# Patient Record
Sex: Female | Born: 1970 | Hispanic: Yes | Smoking: Current every day smoker
Health system: Southern US, Community
[De-identification: ages and names within clinical notes are randomized; demographics above are authoritative.]

## PROBLEM LIST (undated history)

## (undated) DIAGNOSIS — F32A Depression, unspecified: Secondary | ICD-10-CM

## (undated) DIAGNOSIS — F209 Schizophrenia, unspecified: Secondary | ICD-10-CM

## (undated) DIAGNOSIS — L309 Dermatitis, unspecified: Secondary | ICD-10-CM

## (undated) DIAGNOSIS — T7840XA Allergy, unspecified, initial encounter: Secondary | ICD-10-CM

## (undated) DIAGNOSIS — I10 Essential (primary) hypertension: Secondary | ICD-10-CM

## (undated) DIAGNOSIS — E7800 Pure hypercholesterolemia, unspecified: Secondary | ICD-10-CM

## (undated) DIAGNOSIS — F329 Major depressive disorder, single episode, unspecified: Secondary | ICD-10-CM

## (undated) DIAGNOSIS — F319 Bipolar disorder, unspecified: Secondary | ICD-10-CM

## (undated) HISTORY — DX: Schizophrenia, unspecified: F20.9

## (undated) HISTORY — DX: Bipolar disorder, unspecified: F31.9

## (undated) HISTORY — DX: Pure hypercholesterolemia, unspecified: E78.00

## (undated) HISTORY — DX: Essential (primary) hypertension: I10

## (undated) HISTORY — DX: Depression, unspecified: F32.A

## (undated) HISTORY — DX: Dermatitis, unspecified: L30.9

## (undated) HISTORY — DX: Major depressive disorder, single episode, unspecified: F32.9

## (undated) HISTORY — DX: Allergy, unspecified, initial encounter: T78.40XA

## (undated) HISTORY — PX: NO PAST SURGERIES: SHX2092

---

## 2005-05-22 ENCOUNTER — Other Ambulatory Visit: Admission: RE | Admit: 2005-05-22 | Discharge: 2005-05-22 | Payer: Self-pay | Admitting: Gynecology

## 2005-08-31 ENCOUNTER — Encounter: Payer: Self-pay | Admitting: Internal Medicine

## 2005-09-01 ENCOUNTER — Ambulatory Visit: Payer: Self-pay | Admitting: Internal Medicine

## 2005-10-10 ENCOUNTER — Ambulatory Visit: Payer: Self-pay | Admitting: Internal Medicine

## 2005-10-17 ENCOUNTER — Ambulatory Visit: Payer: Self-pay | Admitting: Internal Medicine

## 2005-11-14 ENCOUNTER — Ambulatory Visit: Payer: Self-pay | Admitting: Internal Medicine

## 2005-11-25 ENCOUNTER — Emergency Department (HOSPITAL_COMMUNITY): Admission: EM | Admit: 2005-11-25 | Discharge: 2005-11-25 | Payer: Self-pay | Admitting: Emergency Medicine

## 2006-05-21 ENCOUNTER — Ambulatory Visit: Payer: Self-pay | Admitting: *Deleted

## 2006-05-21 ENCOUNTER — Inpatient Hospital Stay (HOSPITAL_COMMUNITY): Admission: AD | Admit: 2006-05-21 | Discharge: 2006-05-25 | Payer: Self-pay | Admitting: *Deleted

## 2008-01-16 ENCOUNTER — Ambulatory Visit: Payer: Self-pay | Admitting: Internal Medicine

## 2008-01-16 DIAGNOSIS — F250 Schizoaffective disorder, bipolar type: Secondary | ICD-10-CM

## 2008-01-18 DIAGNOSIS — E785 Hyperlipidemia, unspecified: Secondary | ICD-10-CM | POA: Insufficient documentation

## 2008-01-31 ENCOUNTER — Ambulatory Visit: Payer: Self-pay | Admitting: Internal Medicine

## 2008-01-31 LAB — CONVERTED CEMR LAB
Glucose, Urine, Semiquant: NEGATIVE
Ketones, urine, test strip: NEGATIVE
Nitrite: NEGATIVE
Specific Gravity, Urine: 1.02
WBC Urine, dipstick: NEGATIVE

## 2008-02-03 LAB — CONVERTED CEMR LAB
Albumin: 3.5 g/dL (ref 3.5–5.2)
BUN: 11 mg/dL (ref 6–23)
Basophils Relative: 0.6 % (ref 0.0–3.0)
Calcium: 9.3 mg/dL (ref 8.4–10.5)
Creatinine, Ser: 0.8 mg/dL (ref 0.4–1.2)
Direct LDL: 187.8 mg/dL
Eosinophils Absolute: 0.3 10*3/uL (ref 0.0–0.7)
Eosinophils Relative: 4.8 % (ref 0.0–5.0)
GFR calc Af Amer: 104 mL/min
GFR calc non Af Amer: 86 mL/min
HCT: 40 % (ref 36.0–46.0)
MCV: 91.3 fL (ref 78.0–100.0)
Monocytes Absolute: 0.4 10*3/uL (ref 0.1–1.0)
RBC: 4.38 M/uL (ref 3.87–5.11)
Triglycerides: 192 mg/dL — ABNORMAL HIGH (ref 0–149)
WBC: 6.2 10*3/uL (ref 4.5–10.5)

## 2008-09-22 ENCOUNTER — Ambulatory Visit: Payer: Self-pay | Admitting: Gynecology

## 2008-09-22 ENCOUNTER — Other Ambulatory Visit: Admission: RE | Admit: 2008-09-22 | Discharge: 2008-09-22 | Payer: Self-pay | Admitting: Gynecology

## 2008-09-22 ENCOUNTER — Encounter: Payer: Self-pay | Admitting: Gynecology

## 2008-10-05 ENCOUNTER — Encounter: Payer: Self-pay | Admitting: Internal Medicine

## 2008-10-05 ENCOUNTER — Ambulatory Visit: Payer: Self-pay | Admitting: Gynecology

## 2008-10-26 ENCOUNTER — Ambulatory Visit: Payer: Self-pay | Admitting: Internal Medicine

## 2008-10-26 DIAGNOSIS — J309 Allergic rhinitis, unspecified: Secondary | ICD-10-CM | POA: Insufficient documentation

## 2009-01-22 ENCOUNTER — Ambulatory Visit: Payer: Self-pay | Admitting: Internal Medicine

## 2009-01-22 LAB — CONVERTED CEMR LAB
ALT: 32 units/L (ref 0–35)
BUN: 12 mg/dL (ref 6–23)
Cholesterol: 209 mg/dL — ABNORMAL HIGH (ref 0–200)
Indirect Bilirubin: 0.3 mg/dL (ref 0.0–0.9)
Potassium: 4.5 meq/L (ref 3.5–5.3)
Sodium: 140 meq/L (ref 135–145)
Total Protein: 7.2 g/dL (ref 6.0–8.3)
Triglycerides: 179 mg/dL — ABNORMAL HIGH (ref <150)
VLDL: 36 mg/dL (ref 0–40)

## 2009-01-29 ENCOUNTER — Ambulatory Visit: Payer: Self-pay | Admitting: Internal Medicine

## 2009-02-24 ENCOUNTER — Ambulatory Visit: Payer: Self-pay | Admitting: Family Medicine

## 2009-02-25 ENCOUNTER — Encounter: Payer: Self-pay | Admitting: Family Medicine

## 2009-02-25 ENCOUNTER — Telehealth: Payer: Self-pay | Admitting: Family Medicine

## 2009-04-15 ENCOUNTER — Ambulatory Visit: Payer: Self-pay | Admitting: Internal Medicine

## 2009-04-15 LAB — CONVERTED CEMR LAB
Total CHOL/HDL Ratio: 3
Triglycerides: 101 mg/dL (ref 0.0–149.0)

## 2009-04-22 ENCOUNTER — Ambulatory Visit: Payer: Self-pay | Admitting: Internal Medicine

## 2009-05-06 ENCOUNTER — Telehealth: Payer: Self-pay | Admitting: *Deleted

## 2009-06-02 ENCOUNTER — Ambulatory Visit: Payer: Self-pay | Admitting: Internal Medicine

## 2010-02-08 NOTE — Letter (Signed)
Summary: Out of Work  Adult nurse at [employer]  [address]   [place], [place] [address]   Phone: [phone]  Fax: [phone]          February 24, 2009   Employee:  Helayne Mcneil    To Whom It May Concern:   For Medical reasons, please excuse the above named employee from work for the following dates:  Start:   Patient illness began 02/23/2009, OV today 02/24/2009  End:   Patient may RTW 02/25/2009  If you need additional information, please feel free to contact our office.         Sincerely,        Evelena Peat, MD

## 2010-02-08 NOTE — Progress Notes (Signed)
Summary: [facility] to [facility]  Phone Note From Pharmacy   Caller: [facility] Reason for Call: Needs renewal Details for Reason: simvastatin 40mg  Summary of Call: We have never sent a rx to [facility] for pt. I have called pt to make sure this is okay to do.  Initial call taken by: Romualdo Bolk, CMA Duncan Dull),  May 06, 2009 11:09 AM  Follow-up for Phone Call        [facility] Follow-up by: Romualdo Bolk, CMA Duncan Dull),  May 06, 2009 11:13 AM  Additional Follow-up for Phone Call Additional follow up Details #1::        Spoke to pt and she stated that this was okay to send to them. Rx sent electronically. Additional Follow-up by: Romualdo Bolk, CMA (AAMA),  May 10, 2009 4:26 PM    Prescriptions: SIMVASTATIN 40 MG TABS (SIMVASTATIN) 1 by mouth once daily  for high cholesterol  #90 x 2   Entered by:   Romualdo Bolk, CMA (AAMA)   Authorized by:   Madelin Headings MD   Signed by:   Romualdo Bolk, CMA (AAMA) on 05/10/2009   Method used:   Electronically to        [facility]* (mail-order)             ,          Ph: [phone]       Fax: [phone]   RxID:   [id number]

## 2010-02-08 NOTE — Assessment & Plan Note (Signed)
Summary: ? dehydration?/v/d/dm   Vital Signs:  Patient profile:   40 year old female Menstrual status:  regular Temp:     97.7 degrees F oral BP sitting:   102 / 80  (left arm) Cuff size:   regular  Vitals Entered By: Sid Falcon LPN (February 24, 2009 3:00 PM) CC: Diarrhea, vomiting X 2 days   History of Present Illness: Acute visit. Onset yesterday nausea, one episode of vomiting and diarrhea. 8-10 stools/day, generally watery stools. Nonbloody stools. Diffuse abdominal cramping. Generalized weakness and malaise. No muscle cramps. Denies any cough or respiratory symptoms. Keeping down some fluids today  Allergies: 1)  ! Jonne Ply  Past History:  Past Medical History: Last updated: 01/16/2008 GoPo due for gyne check  BIpolar   Consults [facility] Gretta Arab in the past.  Hyperlipidemia PMH reviewed for relevance  Review of Systems      See HPI  Physical Exam  General:  Well-developed,well-nourished,in no acute distress; alert,appropriate and cooperative throughout examination Head:  Normocephalic and atraumatic without obvious abnormalities. No apparent alopecia or balding. Ears:  External ear exam shows no significant lesions or deformities.  Otoscopic examination reveals clear canals, tympanic membranes are intact bilaterally without bulging, retraction, inflammation or discharge. Hearing is grossly normal bilaterally. Mouth:  Oral mucosa and oropharynx without lesions or exudates.  Teeth in good repair. Neck:  No deformities, masses, or tenderness noted. Lungs:  Normal respiratory effort, chest expands symmetrically. Lungs are clear to auscultation, no crackles or wheezes. Heart:  Normal rate and regular rhythm. S1 and S2 normal without gallop, murmur, click, rub or other extra sounds. Abdomen:  soft and non-tender.  somewhat hyperactive bowel sounds.  No mass.   Impression & Recommendations:  Problem # 1:  GASTROENTERITIS, VIRAL  (ICD-008.8) Imodium as needed diarrhea.  Electrolyte replacement.  Work note for next 2 days.  Complete Medication List: 1)  Zyprexa 10 Mg Tabs (Olanzapine) .Marland Kitchen.. 1 by mouth once daily per [facility] 2)  Flonase 50 Mcg/act Susp (Fluticasone propionate) .... 2 sprays each nares once daily for  nasal allergies 3)  Simvastatin 40 Mg Tabs (Simvastatin) .Marland Kitchen.. 1 by mouth once daily  for high cholesterol  Patient Instructions: 1)  Try over-the-counter Imodium for the next couple days as needed for diarrhea symptoms 2)  Eat more potassium rich foods such as bananas, oranje juice, and salt substitutes .  3)  start with bland diet and gradually progress as tolerated

## 2010-02-08 NOTE — Letter (Signed)
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   [place], [place] [address]   Phone: [phone]  Fax: [phone]    February 25, 2009        Employee:  Chelsea [name]    To Whom It May Concern:   For Medical reasons, please excuse the above named employee from work for the following dates:  Start:   02/24/2009  End:   02/25/2009  If you need additional information, please feel free to contact our office.         Sincerely,       Evelena Peat, MD

## 2010-02-08 NOTE — Assessment & Plan Note (Signed)
Summary: 8 wk rov/mm/pt rescd/ccm   Vital Signs:  Patient profile:   40 year old female Menstrual status:  regular LMP:     01/27/2009 Height:      66 inches Weight:      174 pounds BMI:     28.19 Pulse rate:   72 / minute BP sitting:   120 / 80  (right arm) Cuff size:   regular  Vitals Entered By: Romualdo Bolk, CMA (AAMA) (January 29, 2009 8:58 AM) CC: Follow-up visit on labs LMP (date): 01/27/2009 LMP - Character: normal Menarche (age onset years): 15   Menses interval (days): 28 Menstrual flow (days): 7 Menstrual Status regular Enter LMP: 01/27/2009   History of Present Illness: Chelsea [name] comes in today for    follow up of lipids.   Since last visit  here  there have been no major changes in health status   and is changing diet and nutrition but hasnt begun to exercise . ? inertia and has  sitting job. also she has been taking  a new supplement.  Natural  drink medication " transfer factor"     for cardio.   Sharp left  chest pain  gets this  in afternoon.    Lasts seconds off an  is not  assoicated with  sob   no HB  . ? if worse with caffiene.   sleep  ok .  No sleep association.    Preventive Screening-Counseling & Management  Alcohol-Tobacco     Alcohol drinks/day: 0     Smoking Status: quit     Year Quit: 2008  Caffeine-Diet-Exercise     Caffeine use/day: 4-5     Does Patient Exercise: no  Hep-HIV-STD-Contraception     Dental Visit-last 6 months no  Safety-Violence-Falls     Seat Belt Use: yes     Smoke Detectors: yes  Current Medications (verified): 1)  Zyprexa 10 Mg Tabs (Olanzapine) .Marland Kitchen.. 1 By Mouth Once Daily Per [facility] 2)  Flonase 50 Mcg/act Susp (Fluticasone Propionate) .... 2 Sprays Each Nares Once Daily For  Nasal Allergies 3)  Simvastatin 40 Mg Tabs (Simvastatin) .Marland Kitchen.. 1 By Mouth Once Daily  For High Cholesterol  Allergies (verified): 1)  ! Asa  Past History:  Past medical, surgical, family and social histories  (including risk factors) reviewed, and no changes noted (except as noted below).  Past Medical History: Reviewed history from 01/16/2008 and no changes required. GoPo due for gyne check  BIpolar   Consults [facility] Gretta Arab in the past.  Hyperlipidemia  Past Surgical History: Reviewed history from 01/16/2008 and no changes required. Denies surgical history  Past History:  Care Management: Gynecology: [name]  Family History: Reviewed history from 10/26/2008 and no changes required. Father: HBP, High Cholesterol Mother: HBP Siblings: na Brother on medication GP with tyoe 2 dm  No MI ofr cva/   Social History: Reviewed history from 01/16/2008 and no changes required. Former Smoker Alcohol use-no Drug use-no Regular exercise-no HH of 3 fa and mom    no pets  [place]ig from [place]  Occupation: call center    [employer]   Caffeine use/day:  4-5  Review of Systems  The patient denies anorexia, fever, weight loss, weight gain, vision loss, hoarseness, syncope, dyspnea on exertion, peripheral edema, prolonged cough, abdominal pain, melena, hematochezia, severe indigestion/heartburn, transient blindness, difficulty walking, unusual weight change, abnormal bleeding, enlarged lymph nodes, and angioedema.    Physical Exam  General:  Well-developed,well-nourished,in no acute distress; alert,appropriate and cooperative throughout examination Head:  normocephalic and atraumatic.   Eyes:  vision grossly intact and pupils equal.   Neck:  No deformities, masses, or tenderness noted. Chest Wall:  No deformities, masses, or tenderness noted. points to left mid calvicular line t 8-9 area  Lungs:  Normal respiratory effort, chest expands symmetrically. Lungs are clear to auscultation, no crackles or wheezes. Heart:  Normal rate and regular rhythm. S1 and S2 normal without gallop, murmur, click, rub or other extra sounds. Abdomen:  Bowel sounds  positive,abdomen soft and non-tender without masses, organomegaly or  noted.   somewaht protuberant Pulses:  pulses intact without delay   Extremities:  no clubbing cyanosis or edema  Neurologic:  non focal  Skin:  some midline hair lower abdomen Cervical Nodes:  No lymphadenopathy noted Psych:  Oriented X3, good eye contact, not anxious appearing, and not depressed appearing.   Additional Exam:  see labs     EKG NSR no acute changes   Impression & Recommendations:  Problem # 1:  HYPERLIPIDEMIA (ICD-272.4) Assessment Improved  ld from Dr Carmon Ginsberg was 192  tg 249 and risk ratio of 11 .  now 5.4   would like to get ldl to 100 or below if reasonable.  still needs to be better    disc use of supplements and unknown effect .    intensify lifestyle intervention and if not improved consider change ot more potent statin.   Her updated medication list for this problem includes:    Simvastatin 40 Mg Tabs (Simvastatin) .Marland Kitchen... 1 by mouth once daily  for high cholesterol  Labs Reviewed: SGOT: 23 (01/22/2009)   SGPT: 32 (01/22/2009)   HDL:39 (01/22/2009), 36.6 (01/31/2008)  LDL:134 (01/22/2009), DEL (01/31/2008)  Chol:209 (01/22/2009), 269 (01/31/2008)  Trig:179 (01/22/2009), 192 (01/31/2008)  Orders: EKG w/ Interpretation (93[address])  Problem # 2:  CHEST PAIN, INTERMITTENT (ICD-786.50) Assessment: New  fleeting and sounds like CWP  and no cardiac in natrue   . her lipids show risk however but improved   Orders: EKG w/ Interpretation ([address])  Complete Medication List: 1)  Zyprexa 10 Mg Tabs (Olanzapine) .Marland Kitchen.. 1 by mouth once daily per [facility] 2)  Flonase 50 Mcg/act Susp (Fluticasone propionate) .... 2 sprays each nares once daily for  nasal allergies 3)  Simvastatin 40 Mg Tabs (Simvastatin) .Marland Kitchen.. 1 by mouth once daily  for high cholesterol  Contraindications/Deferment of Procedures/Staging:    Test/Procedure: FLU VAX    Reason for deferment: patient declined   Patient Instructions: 1)   increase in exercise   as we discussed  walking  quickly is a good start.   Will decrease triglycerides  and   increase Good cholesterol HDL. 2)  It is important that you exercise reguarly at least 20 minutes 5 times a week. If you develop chest pain, have severe difficulty breathing, or feel very tired, stop exercising immediately and seek medical attention.  3)  I dont think your Chest pain is  cardiac and  your EKG is nl.  call if progressive and change in pain.  4)  Please schedule a follow-up appointment in 3 months .  5)  Lipid panel prior to visit ICD-9 :  272.4 6)  If not continuing to improve may change  medication to more potent statin medication.  Prescriptions: SIMVASTATIN 40 MG TABS (SIMVASTATIN) 1 by mouth once daily  for high cholesterol  #30 x 6   Entered and Authorized by:  Madelin Headings MD   Signed by:   Madelin Headings MD on 01/29/2009   Method used:   Electronically to        [id number]* (retail)       [address] [place], [place]  [address]       Ph: [phone]       Fax: [phone]   RxID:   [id number]

## 2010-02-08 NOTE — Progress Notes (Signed)
Summary: WORK NOTE FOR 01-25-2009  Phone Note Call from Patient Call back at Home Phone [phone]   Caller: Patient Call For: Madelin Headings MD Summary of Call: PT SAW DR Caryl Never 02-24-2009 PT NEEDS A WORK NOTE FOR 02-25-2009. PT WILL PICK UP NOTE Initial call taken by: Heron Sabins,  February 25, 2009 12:08 PM  Follow-up for Phone Call        I spoke with pt, she is still experiencing diarrhea.  Note written. Follow-up by: Sid Falcon LPN,  February 25, 2009 1:38 PM

## 2010-02-08 NOTE — Assessment & Plan Note (Signed)
Summary: severe lower back pain/cjr   Vital Signs:  Patient profile:   40 year old female Menstrual status:  regular LMP:     05/21/2009 Height:      66 inches Weight:      176 pounds Temp:     98.2 degrees F oral Pulse rate:   79 / minute BP sitting:   120 / 80  (right arm)  Vitals Entered By: Kathrynn Speed CMA (Jun 02, 2009 2:01 PM) CC: Lower Back radating down Rt leg x 10 days had moved last week lifted boxes, Back Pain LMP (date): 05/21/2009 LMP - Character: normal Menarche (age onset years): 15   Menses interval (days): 28 Menstrual flow (days): 7 Enter LMP: 05/21/2009   History of Present Illness: Chelsea [name] comes in today     for acute problem. onset last week    insidious onset    and then  after  household to [place] moving  and [employer] went down right leg  some better today.   No numbness or ringling or weakness   in leg.   No meds taken   yesterday.  Cant take nsaids  and asa.    See past notes of back pain with radiation in the past.    got better with pred.  PScyh   stable but needs to find a   new psych because of insurance no longer at [facility].  Back Pain History:      The patient's back pain started approximately 05/26/2009.  She states this is not work related.  On a scale of 1-10, she describes the pain as an 8.  She states that she has had a prior history of back pain.  The patient has not had any recent physical therapy for her back pain.  The following makes the back pain worse: bending lifting .    Critical Exclusionary Diagnosis Criteria (CEDC) for Back Pain:      The patient denies a history of previous trauma.  She has no prior history of spinal surgery.  There are no symptoms to suggest infection, cancer, or cauda equina.     Preventive Screening-Counseling & Management  Alcohol-Tobacco     Alcohol drinks/day: 0     Smoking Status: quit     Year Quit: 2008  Caffeine-Diet-Exercise     Caffeine use/day: 4-5     Does  Patient Exercise: no  Current Medications (verified): 1)  Zyprexa 10 Mg Tabs (Olanzapine) .Marland Kitchen.. 1 By Mouth Once Daily Per [facility] 2)  Flonase 50 Mcg/act Susp (Fluticasone Propionate) .... 2 Sprays Each Nares Once Daily For  Nasal Allergies 3)  Simvastatin 40 Mg Tabs (Simvastatin) .Marland Kitchen.. 1 By Mouth Once Daily  For High Cholesterol  Allergies (verified): 1)  ! Asa  Past History:  Past medical, surgical, family and social histories (including risk factors) reviewed, and no changes noted (except as noted below).  Past Medical History: Reviewed history from 01/16/2008 and no changes required. GoPo due for gyne check  BIpolar   Consults [facility] G[facility]ta [facility] in the past.  Hyperlipidemia  Past Surgical History: Reviewed history from 01/16/2008 and no changes required. Denies surgical history  Family History: Reviewed history from 10/26/2008 and no changes required. Father: HBP, High Cholesterol Mother: HBP Siblings: na Brother on medication GP with tyoe 2 dm  No MI ofr cva/   Social History: Reviewed history from 01/16/2008 and no changes required. Former Smoker Alcohol use-no Drug use-no  Regular exercise-no HH of 3 fa and mom    no pets  [place]ig from [place]  Occupation: call center    [employer]    Physical Exam  General:  Well-developed,well-nourished,in no acute distress; alert,appropriate and cooperative throughout examination Head:  normocephalic and atraumatic.   Neck:  No deformities, masses, or tenderness noted. Lungs:  normal respiratory effort, no intercostal retractions, and no accessory muscle use.   Heart:  normal rate and regular rhythm.   Pulses:  pulses intact without delay   Neurologic:  alert & oriented X3 and gait normal.   Skin:  turgor normal, color normal, no ecchymoses, and no petechiae.   Cervical Nodes:  No lymphadenopathy noted Psych:  Oriented X3, normally interactive, good eye contact, not anxious appearing,  and not depressed appearing.    Low Back Pain Physical Exam:    Inspection-deformity:     No    Palpation-spinal tenderness:   Yes    Motor Exam/Strength:         Left Ankle Dorsiflexion (L5,L4):     normal       Left Great Toe Dorsiflexion (L5,L4):     normal       Left Heel Walk (L5,some L4):     normal       Left Toe Walk-calf (S1):       normal       Right Ankle Dorsiflexion (L5,L4):     normal       Right Great Toe Dorsiflexion (L5,L4):       normal       Right Heel Walk (L5,some L4):     normal       Right Toe Walk-calf (S1):       normal    Reflexes:        Left Knee Jerk (L4):     normal       Left Ankle Reflex (S1):   normal       Right Knee Jerk:     normal       Right Ankle Reflex (S1):   normal    Straight Leg Raise (SLR):       Left Straight Leg Raise (SLR):   negative       Right Straight Leg Raise (SLR):   negative   Impression & Recommendations:  Problem # 1:  BACK PAIN, ACUTE (ICD-724.5) with some radiation  but better  mechanichal and onset with lifting and moving household  .  cant take nsaid so pred trial ok . Expectant management . and follow up .  Her updated medication list for this problem includes:    Cyclobenzaprine Hcl 10 Mg Tabs (Cyclobenzaprine hcl) .Marland Kitchen... 1 by mouth three times a day as needed muscle spasm  Problem # 2:  BIPOLAR DISORDER UNSPECIFIED (ICD-296.80) consider Dr Nolen Mu for new psych  Complete Medication List: 1)  Zyprexa 10 Mg Tabs (Olanzapine) .Marland Kitchen.. 1 by mouth once daily per [facility] 2)  Flonase 50 Mcg/act Susp (Fluticasone propionate) .... 2 sprays each nares once daily for  nasal allergies 3)  Simvastatin 40 Mg Tabs (Simvastatin) .Marland Kitchen.. 1 by mouth once daily  for high cholesterol 4)  Prednisone 20 Mg Tabs (Prednisone) .... Take 2 by mouth once daily for 3 days 1 by mouth once daily for 3days then 1/2 by mouth once daily for 3 days for back pain 5)  Cyclobenzaprine Hcl 10 Mg Tabs (Cyclobenzaprine hcl) .Marland Kitchen.. 1 by mouth three times  a day as needed  muscle spasm  Patient Instructions: 1)  avoid bending and lifting from back 2)  can do a rx with prednisone to reduce inflammation on pinched nerve and muscle relaxant  at  night if needed. 3)  Expect resolution within 1-2 weeks or so . 4)  call if not getting better as expected or as needed.  Prescriptions: CYCLOBENZAPRINE HCL 10 MG TABS (CYCLOBENZAPRINE HCL) 1 by mouth three times a day as needed muscle spasm  #30 x 0   Entered and Authorized by:   Madelin Headings MD   Signed by:   Madelin Headings MD on 06/02/2009   Method used:   Electronically to        [id number]* (retail)       [address] [place], [place]  [address]       Ph: [phone]       Fax: [phone]   RxID:   [id number] PREDNISONE 20 MG TABS (PREDNISONE) take 2 by mouth once daily for 3 days 1 by mouth once daily for 3days then 1/2 by mouth once daily for 3 days for back pain  #30 x 0   Entered and Authorized by:   Madelin Headings MD   Signed by:   Madelin Headings MD on 06/02/2009   Method used:   Electronically to        [id number]* (retail)       [address] [place], [place]  [address]       Ph: [phone]       Fax: [phone]   RxID:   [id number]

## 2010-02-08 NOTE — Assessment & Plan Note (Signed)
Summary: 3 MONTH FUP//CCM   Vital Signs:  Patient profile:   40 year old female Menstrual status:  regular LMP:     03/28/2009 Weight:      175 pounds Pulse rate:   66 / minute BP sitting:   130 / 80  (left arm) Cuff size:   regular  Vitals Entered By: Romualdo Bolk, CMA Duncan Dull) (April 22, 2009 8:47 AM) CC: follow-up visit on labs LMP (date): 03/28/2009 LMP - Character: normal Menarche (age onset years): 15   Menses interval (days): 28 Menstrual flow (days): 7 Enter LMP: 03/28/2009   History of Present Illness: Chelsea [name] comesin comes in today  for follow up of her lipids and medication. Since last visit  here  there have been no major changes in health status   she has taken her med without se  is trying to lose weight.   Would like help with this. no exercising much now.  zyprexa makesit hard to maintain helathy weight.  Preventive Screening-Counseling & Management  Alcohol-Tobacco     Alcohol drinks/day: 0     Smoking Status: quit     Year Quit: 2008  Caffeine-Diet-Exercise     Caffeine use/day: 4-5     Does Patient Exercise: no  Current Medications (verified): 1)  Zyprexa 10 Mg Tabs (Olanzapine) .Marland Kitchen.. 1 By Mouth Once Daily Per [facility] 2)  Flonase 50 Mcg/act Susp (Fluticasone Propionate) .... 2 Sprays Each Nares Once Daily For  Nasal Allergies 3)  Simvastatin 40 Mg Tabs (Simvastatin) .Marland Kitchen.. 1 By Mouth Once Daily  For High Cholesterol  Allergies (verified): 1)  ! Asa  Past History:  Past medical, surgical, family and social histories (including risk factors) reviewed for relevance to current acute and chronic problems.  Past Medical History: Reviewed history from 01/16/2008 and no changes required. GoPo due for gyne check  BIpolar   Consults [facility] Gretta Arab in the past.  Hyperlipidemia  Past Surgical History: Reviewed history from 01/16/2008 and no changes required. Denies surgical history  Past  History:  Care Management: Gynecology: [name] Psychiatry: [facility]  Family History: Reviewed history from 10/26/2008 and no changes required. Father: HBP, High Cholesterol Mother: HBP Siblings: na Brother on medication GP with tyoe 2 dm  No MI ofr cva/   Social History: Reviewed history from 01/16/2008 and no changes required. Former Smoker Alcohol use-no Drug use-no Regular exercise-no HH of 3 fa and mom    no pets  [place]ig from [place]  Occupation: call center    [employer]    Physical Exam  General:  Well-developed,well-nourished,in no acute distress; alert,appropriate and cooperative throughout examination Psych:  Oriented X3, good eye contact, not anxious appearing, and not depressed appearing.     Impression & Recommendations:  Problem # 1:  HYPERLIPIDEMIA (ICD-272.4) Assessment Improved  no se of med  is on high risk med but doing okk.      Treatment options discussed. .     counseled     will do a mutritionreferral and reasonable to do lifestyle intervention and then can try off med and see response.  Her updated medication list for this problem includes:    Simvastatin 40 Mg Tabs (Simvastatin) .Marland Kitchen... 1 by mouth once daily  for high cholesterol  Labs Reviewed: SGOT: 23 (01/22/2009)   SGPT: 32 (01/22/2009)   HDL:41.50 (04/15/2009), 39 (01/22/2009)  LDL:65 (04/15/2009), 134 (65/78/4696)  Chol:127 (04/15/2009), 209 (01/22/2009)  Trig:101.0 (04/15/2009), 179 (01/22/2009)  Orders: Nutrition  Referral (Nutrition)  Complete Medication List: 1)  Zyprexa 10 Mg Tabs (Olanzapine) .Marland Kitchen.. 1 by mouth once daily per [facility] 2)  Flonase 50 Mcg/act Susp (Fluticasone propionate) .... 2 sprays each nares once daily for  nasal allergies 3)  Simvastatin 40 Mg Tabs (Simvastatin) .Marland Kitchen.. 1 by mouth once daily  for high cholesterol  Patient Instructions: 1)  for now stay on medication  2)  will contact   you about nutrition referral 3)  weight loss exercise  diet  changes will help 4)  Call  If want to try off med and  recheck lipids  5)  Otherwise would recheck lipids and lfts in a year

## 2010-02-08 NOTE — Letter (Signed)
Summary: Out of Work  Adult nurse at [employer]  [address]   [place], [place] [address]   Phone: [phone]  Fax: [phone]    February 25, 2009          Employee:  Chelsea [name]    To Whom It May Concern:   For Medical reasons, please excuse the above named employee from work for the following dates:  Start:   02/25/2009  End:   02/25/2009   If you need additional information, please feel free to contact our office.         Sincerely,       Evelena Peat, MD

## 2010-05-24 NOTE — Discharge Summary (Signed)
Chelsea [name], Chelsea [name]              ACCOUNT NO.:  [id number]   MEDICAL RECORD NO.:  [id number]          PATIENT TYPE:  IPS   LOCATION:  0406                          [facility]ACILITY:  [facility]   PHYSICIAN:  Jasmine Pang, M.D. DATE OF BIRTH:  1970-01-31   DATE OF ADMISSION:  05/21/2006  DATE OF DISCHARGE:  05/25/2006                               DISCHARGE SUMMARY   IDENTIFYING INFORMATION:  This is a 40 year old single female who was  admitted on involuntary basis on May 21, 2006.   HISTORY OF PRESENT ILLNESS:  The patient has a history of depression.  She is here on involuntary petition.  The paper states that her  condition had deteriorated.  She had locked herself in the bathroom.  The family had to break the door down.  She has been not sleeping well  and not eating well.  The family states she talks to the television.  She has reportedly been noncompliant with her medications.  This is the  first [facility] admission for this patient.  She is sponsored by [facility].  She is from [place] and she has a  history of bipolar disorder.  The patient denies any alcohol or drug  use.  She has no acute or chronic medical problems.  She is supposed to  be on Lamictal, Seroquel, lithium and Xanax, but has not been compliant  with these medications.  She is allergic to aspirin.   PHYSICAL FINDINGS:  A complete physical exam was done by our nurse  practitioner.  The patient was found to be healthy with no acute  physical or medical distress.   ADMISSION LABORATORIES:  CBC was within normal limits.  Basic metabolic  panel was within normal limits.  Hepatic profile was grossly within  normal limits, except for a slightly elevated total bilirubin of 1.3.  Urine pregnancy test was negative.  TSH was 1.432, which was within  normal limits.  Urine drug screen was negative.  Urinalysis was  negative.   HOSPITAL COURSE:  Upon admission, the patient was continued on her  lithium  300 mg p.o. b.i.d..  An a.m. lithium level was obtained which  was low at 0.37.  She was also started on Seroquel 25 mg q.6 h p.r.n.  for anxiety.  On May 22, 2006, Xanax 0.5 mg p.o. q.8 h p.r.n. anxiety  was started and Seroquel 100 mg p.o. q.h.s. was started.  On May 23, 2006, lithium carbonate was increased to 300 mg in the morning and 600  mg at 1700.  An a.m. lithium level from today is still pending.  Seroquel was discontinued.  She was started on Risperdal 0.5 mg p.o.  b.i.d. and 1 mg p.o. q.h.s..  The patient tolerated these medications  well with no significant side effects.   The patient was initially very reserved and guarded.  She seemed  paranoid when I talked with her.  She was reportedly not taking her  medications and was referred by the [facility]  on involuntary papers on May 23, 2006.  The patient still  seems somewhat  paranoid, though she stated I am good.  She spoke with one-word  answers and did not endorse any problems.  She had poor eye contact with  psychomotor retardation.  On May 24, 2006, the patient was more  cooperative and friendly.  Sleep was good.  Appetite was good.  Mood was  stable.  Affect wide range.  No suicidal or homicidal ideation.  No  auditory or visual hallucinations.  No side effects to the meds.  She  discussed returning to live with her parents.  She states that they do  not speak [place] which is why they had not returned our calls to them.  It was anticipated that she would be discharged tomorrow.  On May 25, 2006, the patient's mental status exam remained unchanged from the day  before.  She was stable and felt ready for discharge.  She called her  parents for a ride home and will be staying with them.   DISCHARGE DIAGNOSES:  AXIS I:  Bipolar disorder NOS.  AXIS II:  None.  AXIS III:  No acute or chronic medical problems.  AXIS IV:  Moderate.  (Occupational problem, burden of psychiatric  illness)  AXIS V:   GAF upon discharge is 48.  GAF upon admission was 30.  GAF  highest past year 60-65.   DISCHARGE PLAN:  There were no specific activity level or dietary  restrictions.   POST-HOSPITAL CARE PLANS:  The patient will see Dr. Tomasa Rand, her  doctor at [facility], on June 4 at 2:30 p.m.Marland Kitchen  She will be seen at  [facility] for counseling and was given a number to call to arrange  this appointment.   DISCHARGE MEDICATIONS:  1. Lithium carbonate 300 mg in the morning and 600 mg at 5:00 p.m.      with food.  2. Risperdal 0.5 mg p.o. b.i.d. and 1 mg at bedtime.  3. Ambien 10 mg at bedtime if needed for insomnia.      Jasmine Pang, M.D.  Electronically Signed     BHS/MEDQ  D:  05/25/2006  T:  05/25/2006  Job:  [id number]

## 2010-05-24 NOTE — H&P (Signed)
NAMEMEGAHN, KILLINGS NO.:  [id number]   MEDICAL RECORD NO.:  [id number]          PATIENT TYPE:  IPS   LOCATION:  0401                          FACILITY:  [facility]   PHYSICIAN:  Jasmine Pang, M.D. DATE OF BIRTH:  Mar 22, 1970   DATE OF ADMISSION:  05/21/2006  DATE OF DISCHARGE:                       PSYCHIATRIC ADMISSION ASSESSMENT   IDENTIFYING INFORMATION:  The patient is a 40 year old single Hispanic  female involuntary admitted on May 21, 2006.   HISTORY OF PRESENT ILLNESS:  The patient is here on petition.  Papers  state that the patient's condition has been deteriorating.  Had locked  herself in the bathroom.  Family had to break down the door.  Poor  sleeping, poor eating, talking to the television and noncompliant with  her medications.  The patient states that she is not sure why she is  here.  She does report having problems with sleep, having problems with  panic attacks.  States she is up at night and sleeping during the day.  Denying any psychosis and does report that she has been compliant with  her medications.   PAST PSYCHIATRIC HISTORY:  First admission to the [facility].  She is currently sponsored by [facility].  Was hospitalized in [place] about two years ago.  Has a history of  bipolar disorder.   SOCIAL HISTORY:  This is a 40 year old single Hispanic female who lives  with her mother and father since the age of 32.  She is currently  unemployed.  She has not been working since November of 2007.  States  she had to leave her job due to panic attacks.  She has a college degree  with a degree in management.   FAMILY HISTORY:  None.   ALCOHOL/DRUG HISTORY:  The patient smokes.  Denies any alcohol or drug  use.   PRIMARY CARE PHYSICIAN:  Cannot remember her Kipp Shank's name but states  Layten Aiken is on [employer] in [place].   MEDICAL PROBLEMS:  Denies any acute or chronic health issues.   MEDICATIONS:  Has been on Lamictal 200 mg in a.m., Seroquel 200 mg in  the morning, 600 mg at bedtime, Xanax which we cannot confirm, lithium  300 mg, 1 in the morning and 2 at bedtime.  Again, reporting compliance  with her medications.   ALLERGIES:  ASPIRIN (states her eyes get swollen).   REVIEW OF SYSTEMS:  Denies any fever, chills, positive for insomnia and  no weight changes.  No chest pain or shortness of breath.  Does smoke.  Visual problems, wears glasses.  Denies any nausea, vomiting, diarrhea.  No dysuria.  No falls.  No seizures.  No weakness.  No tingling.  Positive for depression.   PHYSICAL EXAMINATION:  VITAL SIGNS:  Temperature is 97, heart rate 100,  respiratory rate 18, blood pressure is 155/103.  She is 5 feet 6 inches  tall, weighs 167 pounds.  GENERAL:  This is a young female in no acute distress.  NECK:  Negative lymphadenopathy.  The patient has a dry cough.  LUNGS:  Her bilateral breath sounds  are equal and clear.  No wheezing.  BREASTS:  Exam is deferred.  HEART:  Regular rate and rhythm.  No extra sounds auscultated.  ABDOMEN:  Soft, flat, nontender abdomen.  GU:  Exam is deferred.  EXTREMITIES:  The patient moves all extremities, 5+ against resistance.  No clubbing, no edema.  SKIN:  Warm and dry without rashes or lacerations.  Nursing assessment  notes the patient has a tattoo on her upper back and tattoo to her ankle  area.  NEUROLOGICAL:  Findings are intact and nonfocal.   LABORATORY DATA:  WBC count 11.6.  Alcohol level less than 5.  Bilirubin  is 1.3.  TSH is 1.432.   MENTAL STATUS EXAM:  She is fully alert.  She is cooperative.  She is  casually dressed.  She has good eye contact.  Speech is clear, somewhat  evasive in regards to answering questions.  No initiation of any  conversation.  Her mood is neutral.  The patient's affect is reserved.  Thought processes with some questionable paranoid ideation.  Her  cognitive function intact.  Her  memory is good.  Judgment is fair.  Insight is minimal.   DIAGNOSES:  AXIS I:  Bipolar disorder.  AXIS II:  Deferred.  AXIS III:  No acute or chronic health issues.  AXIS IV:  Problems with occupation, other psychosocial problems related  to burden of illness.  AXIS V:  Current 30-35.   PLAN:  Stabilize mood and thinking.  Contract for safety.  Will resume  her lithium and check lithium level.  Will have Seroquel available on a  p.r.n. basis for sleep.  We will contact family for background  information and discharge planning.  Medication compliance will be  reinforced.  The patient is to follow up with [facility].   TENTATIVE LENGTH OF STAY:  Four to five days.      Landry Corporal, N.P.      Jasmine Pang, M.D.  Electronically Signed    JO/MEDQ  D:  05/22/2006  T:  05/22/2006  Job:  [id number]

## 2010-05-27 NOTE — Assessment & Plan Note (Signed)
Chelsea [name]                              Chelsea [name]   NAME:Chelsea [name], Chelsea [name]                       MRN:          [id number]  DATE:09/01/2005                            DOB:          August 06, 1970    CHIEF COMPLAINT:  New patient to establish.  Problems with cholesterol.   HISTORY OF PRESENT ILLNESS:  Chelsea [name] is a 40 year old, ex-smoking,  divorced, [place] American woman, who comes in today for a first time  visit, referred by Dr. Reynaldo Minium, her GYN, for a problem with her  cholesterol.  She has generally been healthy.  The most problematic things  have been in the recent past, has been hospitalization for depression in  2005.  She has been diagnosed as bipolar.  Her current psychiatrist is Dr.  Tomasa Rand, and she has been on a current regimen of Lamictal 50 mg,  Risperdal 1 mg at night for at least a month, which is significantly helping  her mood, and she is doing well on that.  She has had a history of elevated  blood cholesterol readings in the past, but no medications.  She has a  remote history of migraines that are not significant otherwise.  She denies  any cardiovascular symptoms except for she gets an occasional sharp left  lower chest pain that is not related to exercise or eating.  It is not a  problem now, but she has not had any kind of recent EKG.   Lab tests done by Dr. [name] in May 2007 showed a normal urinalysis, CBC,  with the lipids of 231, triglycerides 67, HDL 59, LDL 159, and total ratio  of 3.9.  TSH and fasting blood sugar were within normal limits.   PAST MEDICAL HISTORY:  As above, and see data base.  She is primiparous, her  last period August 15, last Pap May 2007.  She thinks she is up to date on  her tetanus shot.   FAMILY HISTORY:  Both parents with elevated cholesterol, one with  hypertension, diabetes in PGM, MI in MGF.  Negative family history of  thyroid disease.   SOCIAL HISTORY:   Divorced, college education, works in Advertising account executive.  Lives with her cousin, no pets, 8 to 10 hours of sleep a night.  Exercises 3 days a week, __________  when she does that.  A remote history  of smoking, stopped over a year ago.   REVIEW OF SYSTEMS:  Chest pain as above, but really no exercise-induced  symptoms or shortness of breath or cough. The rest of the ROS negative or  noncontributory.   MEDICATIONS:  1. Risperdal 1 mg.  2. Lamictal 15 mg.  3. OTC vitamins.   ALLERGIES:  ASA.   OBJECTIVE:  VITAL SIGNS:  Height reported as 5 feet 6 inches about, weight  140 pounds, pulse 80 and regular, blood pressure 120/80.  GENERAL:  This is a WDWN, healthy-appearing, middle-aged lady in no acute  distress.  HEENT:  Grossly normal.  NECK:  Supple without masses, although thyroid is easily palpable there  are  no nodules noted, no bruits are noted.  CHEST:  CTA.  BS equal.  CARDIAC:  S1, S2, no gallops or murmurs.  EXTREMITIES:  Peripheral pulses present without delay, negative CCE.  ABDOMEN:  Soft without organomegaly, guarding or rebound.  No obvious chest  wall pain noted.  NEUROLOGIC:  Grossly intact, and mood appears within normal limits and  appropriate affect.  She is also nonicteric.   LABS:  See records to review.   IMPRESSION:  Elevated cholesterol, positive family history of hypertension  and diabetes and hyperlipidemia.  Fortunately, she does not have elevated  triglycerides, and low HDL, with her LDL 159 and a reasonable ration at  present, and for this reason we discussed this.  We would like to do further  efforts on lifestyle changes over the next 3 or 4 months to see if we can  improve her lipid profile.  She is also on Risperdal, which somewhat a risk  for hyperglycemia, but we see no evidence of this at present.  We will have  her check fasting lab work about October 2007, with lipids, blood sugar and  liver function tests, because she has had a  history of abnormals in the past  when she was on Depakote, and then a followup office visit then.  Planned  followup after that time.   ADDITIONAL HISTORY:  We did do an EKG today because of a history of  questionable atypical chest pain, and this was in normal limits, with some  decreased R forces in V3, probably normal.                                   Burna Mortimer K. Fabian Sharp, MD   WKP/MedQ  DD:  09/01/2005  DT:  09/02/2005  Job #:  [id number]

## 2011-08-01 ENCOUNTER — Encounter: Payer: Self-pay | Admitting: Internal Medicine

## 2011-11-17 ENCOUNTER — Ambulatory Visit: Payer: Self-pay | Admitting: Internal Medicine

## 2011-11-20 ENCOUNTER — Encounter: Payer: Self-pay | Admitting: Internal Medicine

## 2011-11-20 ENCOUNTER — Ambulatory Visit: Payer: Self-pay | Admitting: Internal Medicine

## 2011-11-20 DIAGNOSIS — Z0289 Encounter for other administrative examinations: Secondary | ICD-10-CM

## 2013-10-04 DIAGNOSIS — E875 Hyperkalemia: Secondary | ICD-10-CM | POA: Insufficient documentation

## 2013-10-04 DIAGNOSIS — D72829 Elevated white blood cell count, unspecified: Secondary | ICD-10-CM | POA: Insufficient documentation

## 2013-10-04 DIAGNOSIS — E7220 Disorder of urea cycle metabolism, unspecified: Secondary | ICD-10-CM | POA: Insufficient documentation

## 2015-09-18 LAB — HM MAMMOGRAPHY

## 2015-12-14 ENCOUNTER — Ambulatory Visit: Payer: Self-pay | Admitting: Gynecology

## 2016-01-12 ENCOUNTER — Ambulatory Visit (INDEPENDENT_AMBULATORY_CARE_PROVIDER_SITE_OTHER): Payer: Self-pay | Admitting: Gynecology

## 2016-01-12 ENCOUNTER — Encounter: Payer: Self-pay | Admitting: Gynecology

## 2016-01-12 VITALS — BP 138/88 | Ht 65.25 in | Wt 174.0 lb

## 2016-01-12 DIAGNOSIS — F17200 Nicotine dependence, unspecified, uncomplicated: Secondary | ICD-10-CM

## 2016-01-12 DIAGNOSIS — Z01419 Encounter for gynecological examination (general) (routine) without abnormal findings: Secondary | ICD-10-CM

## 2016-01-12 NOTE — Progress Notes (Signed)
Chelsea [name] 05-Jul-1970 [id number]   History:    46 y.o.  for annual gyn exam who has not been seen the office in over 5 years. She stated her PCP has done her Pap smear 2 years ago. She also states that when she was in her early 64s while living important [place] she had an abnormal Pap smear had cryotherapy and subsequent Pap smears have been normal. She states she has not been sexually active in over 11 years she is a chronic smoker smokes between one to one half pack of cigarettes per day. She is also being followed by her psychiatrist for bipolar disorder. Patient reports normal menstrual cycles.  Past medical history,surgical history, family history and social history were all reviewed and documented in the EPIC chart.  Gynecologic History Patient's last menstrual period was 12/24/2015. Contraception: none Last Pap: 2 years ago. Results were: normal Last mammogram: 2017. Results were: normal  Obstetric History OB History  Gravida Para Term Preterm AB Living  0 0 0 0 0 0  SAB TAB Ectopic Multiple Live Births  0 0 0 0 0         ROS: A ROS was performed and pertinent positives and negatives are included in the history.  GENERAL: No fevers or chills. HEENT: No change in vision, no earache, sore throat or sinus congestion. NECK: No pain or stiffness. CARDIOVASCULAR: No chest pain or pressure. No palpitations. PULMONARY: No shortness of breath, cough or wheeze. GASTROINTESTINAL: No abdominal pain, nausea, vomiting or diarrhea, melena or bright red blood per rectum. GENITOURINARY: No urinary frequency, urgency, hesitancy or dysuria. MUSCULOSKELETAL: No joint or muscle pain, no back pain, no recent trauma. DERMATOLOGIC: No rash, no itching, no lesions. ENDOCRINE: No polyuria, polydipsia, no heat or cold intolerance. No recent change in weight. HEMATOLOGICAL: No anemia or easy bruising or bleeding. NEUROLOGIC: No headache, seizures, numbness, tingling or weakness. PSYCHIATRIC: No  depression, no loss of interest in normal activity or change in sleep pattern.     Exam: chaperone present  BP 138/88   Ht 5' 5.25" (1.657 m)   Wt 174 lb (78.9 kg)   LMP 12/24/2015   BMI 28.73 kg/m   Body mass index is 28.73 kg/m.  General appearance : Well developed well nourished female. No acute distress HEENT: Eyes: no retinal hemorrhage or exudates,  Neck supple, trachea midline, no carotid bruits, no thyroidmegaly Lungs: Clear to auscultation, no rhonchi or wheezes, or rib retractions  Heart: Regular rate and rhythm, no murmurs or gallops Breast:Examined in sitting and supine position were symmetrical in appearance, no palpable masses or tenderness,  no skin retraction, no nipple inversion, no nipple discharge, no skin discoloration, no axillary or supraclavicular lymphadenopathy Abdomen: no palpable masses or tenderness, no rebound or guarding Extremities: no edema or skin discoloration or tenderness  Pelvic:  Bartholin, Urethra, Skene Glands: Within normal limits             Vagina: No gross lesions or discharge  Cervix: No gross lesions or discharge  Uterus  anteverted, normal size, shape and consistency, non-tender and mobile  Adnexa  Without masses or tenderness  Anus and perineum  normal   Rectovaginal  normal sphincter tone without palpated masses or tenderness             Hemoccult not indicated     Assessment/Plan:  46 y.o. female for annual exam was counseled on the detrimental effects of smoking and literature information was provided. Pap smear was done  today. PCP has been doing her blood work. Patient declined flu vaccine today. She was encouraged to do her monthly breast exams.   Ok EdwardsFERNANDEZ,JUAN H MD, 10:58 AM 01/12/2016

## 2016-01-12 NOTE — Patient Instructions (Signed)
Steps to Quit Smoking Smoking tobacco can be harmful to your health and can affect almost every organ in your body. Smoking puts you, and those around you, at risk for developing many serious chronic diseases. Quitting smoking is difficult, but it is one of the best things that you can do for your health. It is never too late to quit. What are the benefits of quitting smoking? When you quit smoking, you lower your risk of developing serious diseases and conditions, such as:  Lung cancer or lung disease, such as COPD.  Heart disease.  Stroke.  Heart attack.  Infertility.  Osteoporosis and bone fractures.  Additionally, symptoms such as coughing, wheezing, and shortness of breath may get better when you quit. You may also find that you get sick less often because your body is stronger at fighting off colds and infections. If you are pregnant, quitting smoking can help to reduce your chances of having a baby of low birth weight. How do I get ready to quit? When you decide to quit smoking, create a plan to make sure that you are successful. Before you quit:  Pick a date to quit. Set a date within the next two weeks to give you time to prepare.  Write down the reasons why you are quitting. Keep this list in places where you will see it often, such as on your bathroom mirror or in your car or wallet.  Identify the people, places, things, and activities that make you want to smoke (triggers) and avoid them. Make sure to take these actions: ? Throw away all cigarettes at home, at work, and in your car. ? Throw away smoking accessories, such as ashtrays and lighters. ? Clean your car and make sure to empty the ashtray. ? Clean your home, including curtains and carpets.  Tell your family, friends, and coworkers that you are quitting. Support from your loved ones can make quitting easier.  Talk with your health care provider about your options for quitting smoking.  Find out what treatment  options are covered by your health insurance.  What strategies can I use to quit smoking? Talk with your healthcare provider about different strategies to quit smoking. Some strategies include:  Quitting smoking altogether instead of gradually lessening how much you smoke over a period of time. Research shows that quitting "cold turkey" is more successful than gradually quitting.  Attending in-person counseling to help you build problem-solving skills. You are more likely to have success in quitting if you attend several counseling sessions. Even short sessions of 10 minutes can be effective.  Finding resources and support systems that can help you to quit smoking and remain smoke-free after you quit. These resources are most helpful when you use them often. They can include: ? Online chats with a counselor. ? Telephone quitlines. ? Printed self-help materials. ? Support groups or group counseling. ? Text messaging programs. ? Mobile phone applications.  Taking medicines to help you quit smoking. (If you are pregnant or breastfeeding, talk with your health care provider first.) Some medicines contain nicotine and some do not. Both types of medicines help with cravings, but the medicines that include nicotine help to relieve withdrawal symptoms. Your health care provider may recommend: ? Nicotine patches, gum, or lozenges. ? Nicotine inhalers or sprays. ? Non-nicotine medicine that is taken by mouth.  Talk with your health care provider about combining strategies, such as taking medicines while you are also receiving in-person counseling. Using these two strategies together   makes you more likely to succeed in quitting than if you used either strategy on its own. If you are pregnant or breastfeeding, talk with your health care provider about finding counseling or other support strategies to quit smoking. Do not take medicine to help you quit smoking unless told to do so by your health care  provider. What things can I do to make it easier to quit? Quitting smoking might feel overwhelming at first, but there is a lot that you can do to make it easier. Take these important actions:  Reach out to your family and friends and ask that they support and encourage you during this time. Call telephone quitlines, reach out to support groups, or work with a counselor for support.  Ask people who smoke to avoid smoking around you.  Avoid places that trigger you to smoke, such as bars, parties, or smoke-break areas at work.  Spend time around people who do not smoke.  Lessen stress in your life, because stress can be a smoking trigger for some people. To lessen stress, try: ? Exercising regularly. ? Deep-breathing exercises. ? Yoga. ? Meditating. ? Performing a body scan. This involves closing your eyes, scanning your body from head to toe, and noticing which parts of your body are particularly tense. Purposefully relax the muscles in those areas.  Download or purchase mobile phone or tablet apps (applications) that can help you stick to your quit plan by providing reminders, tips, and encouragement. There are many free apps, such as QuitGuide from the CDC (Centers for Disease Control and Prevention). You can find other support for quitting smoking (smoking cessation) through smokefree.gov and other websites.  How will I feel when I quit smoking? Within the first 24 hours of quitting smoking, you may start to feel some withdrawal symptoms. These symptoms are usually most noticeable 2-3 days after quitting, but they usually do not last beyond 2-3 weeks. Changes or symptoms that you might experience include:  Mood swings.  Restlessness, anxiety, or irritation.  Difficulty concentrating.  Dizziness.  Strong cravings for sugary foods in addition to nicotine.  Mild weight gain.  Constipation.  Nausea.  Coughing or a sore throat.  Changes in how your medicines work in your  body.  A depressed mood.  Difficulty sleeping (insomnia).  After the first 2-3 weeks of quitting, you may start to notice more positive results, such as:  Improved sense of smell and taste.  Decreased coughing and sore throat.  Slower heart rate.  Lower blood pressure.  Clearer skin.  The ability to breathe more easily.  Fewer sick days.  Quitting smoking is very challenging for most people. Do not get discouraged if you are not successful the first time. Some people need to make many attempts to quit before they achieve long-term success. Do your best to stick to your quit plan, and talk with your health care provider if you have any questions or concerns. This information is not intended to replace advice given to you by your health care provider. Make sure you discuss any questions you have with your health care provider. Document Released: 12/20/2000 Document Revised: 08/24/2015 Document Reviewed: 05/12/2014 Elsevier Interactive Patient Education  2017 Elsevier Inc.  

## 2016-01-13 LAB — PAP IG W/ RFLX HPV ASCU

## 2016-05-24 ENCOUNTER — Encounter: Payer: Self-pay | Admitting: Gynecology

## 2016-06-02 DIAGNOSIS — Z79899 Other long term (current) drug therapy: Secondary | ICD-10-CM | POA: Diagnosis not present

## 2016-06-02 DIAGNOSIS — R21 Rash and other nonspecific skin eruption: Secondary | ICD-10-CM | POA: Diagnosis not present

## 2016-06-02 DIAGNOSIS — F17210 Nicotine dependence, cigarettes, uncomplicated: Secondary | ICD-10-CM | POA: Diagnosis not present

## 2016-06-02 DIAGNOSIS — F209 Schizophrenia, unspecified: Secondary | ICD-10-CM | POA: Diagnosis not present

## 2016-06-02 DIAGNOSIS — I10 Essential (primary) hypertension: Secondary | ICD-10-CM | POA: Diagnosis not present

## 2016-06-02 DIAGNOSIS — Z87892 Personal history of anaphylaxis: Secondary | ICD-10-CM | POA: Diagnosis not present

## 2016-06-02 DIAGNOSIS — Z720 Tobacco use: Secondary | ICD-10-CM | POA: Diagnosis not present

## 2016-06-02 DIAGNOSIS — Z888 Allergy status to other drugs, medicaments and biological substances status: Secondary | ICD-10-CM | POA: Diagnosis not present

## 2016-06-02 DIAGNOSIS — Z886 Allergy status to analgesic agent status: Secondary | ICD-10-CM | POA: Diagnosis not present

## 2016-06-02 DIAGNOSIS — F319 Bipolar disorder, unspecified: Secondary | ICD-10-CM | POA: Diagnosis not present

## 2016-07-20 DIAGNOSIS — F319 Bipolar disorder, unspecified: Secondary | ICD-10-CM | POA: Diagnosis not present

## 2016-07-20 DIAGNOSIS — F17210 Nicotine dependence, cigarettes, uncomplicated: Secondary | ICD-10-CM | POA: Diagnosis not present

## 2016-07-20 DIAGNOSIS — F209 Schizophrenia, unspecified: Secondary | ICD-10-CM | POA: Diagnosis not present

## 2016-07-20 DIAGNOSIS — I10 Essential (primary) hypertension: Secondary | ICD-10-CM | POA: Diagnosis not present

## 2016-07-20 DIAGNOSIS — Z720 Tobacco use: Secondary | ICD-10-CM | POA: Diagnosis not present

## 2016-07-20 DIAGNOSIS — Z79899 Other long term (current) drug therapy: Secondary | ICD-10-CM | POA: Diagnosis not present

## 2016-07-20 DIAGNOSIS — Z888 Allergy status to other drugs, medicaments and biological substances status: Secondary | ICD-10-CM | POA: Diagnosis not present

## 2016-07-20 DIAGNOSIS — Z886 Allergy status to analgesic agent status: Secondary | ICD-10-CM | POA: Diagnosis not present

## 2016-09-24 DIAGNOSIS — L309 Dermatitis, unspecified: Secondary | ICD-10-CM | POA: Diagnosis not present

## 2016-09-24 DIAGNOSIS — I10 Essential (primary) hypertension: Secondary | ICD-10-CM | POA: Diagnosis not present

## 2016-09-24 DIAGNOSIS — L03119 Cellulitis of unspecified part of limb: Secondary | ICD-10-CM | POA: Diagnosis not present

## 2016-09-24 DIAGNOSIS — L03114 Cellulitis of left upper limb: Secondary | ICD-10-CM | POA: Diagnosis not present

## 2016-09-24 DIAGNOSIS — L03113 Cellulitis of right upper limb: Secondary | ICD-10-CM | POA: Diagnosis not present

## 2016-10-12 DIAGNOSIS — J309 Allergic rhinitis, unspecified: Secondary | ICD-10-CM | POA: Diagnosis not present

## 2016-10-12 DIAGNOSIS — F17210 Nicotine dependence, cigarettes, uncomplicated: Secondary | ICD-10-CM | POA: Diagnosis present

## 2016-10-12 DIAGNOSIS — F250 Schizoaffective disorder, bipolar type: Secondary | ICD-10-CM | POA: Diagnosis not present

## 2016-10-12 DIAGNOSIS — F419 Anxiety disorder, unspecified: Secondary | ICD-10-CM | POA: Diagnosis present

## 2016-10-12 DIAGNOSIS — F17200 Nicotine dependence, unspecified, uncomplicated: Secondary | ICD-10-CM | POA: Diagnosis not present

## 2016-10-12 DIAGNOSIS — E7800 Pure hypercholesterolemia, unspecified: Secondary | ICD-10-CM | POA: Diagnosis not present

## 2016-10-12 DIAGNOSIS — Z888 Allergy status to other drugs, medicaments and biological substances status: Secondary | ICD-10-CM | POA: Diagnosis not present

## 2016-10-12 DIAGNOSIS — I10 Essential (primary) hypertension: Secondary | ICD-10-CM | POA: Diagnosis not present

## 2016-10-12 DIAGNOSIS — L309 Dermatitis, unspecified: Secondary | ICD-10-CM | POA: Diagnosis not present

## 2016-10-12 DIAGNOSIS — G4700 Insomnia, unspecified: Secondary | ICD-10-CM | POA: Diagnosis present

## 2016-10-12 DIAGNOSIS — Z886 Allergy status to analgesic agent status: Secondary | ICD-10-CM | POA: Diagnosis not present

## 2016-10-12 DIAGNOSIS — Z79899 Other long term (current) drug therapy: Secondary | ICD-10-CM | POA: Diagnosis not present

## 2016-10-16 DIAGNOSIS — I10 Essential (primary) hypertension: Secondary | ICD-10-CM | POA: Insufficient documentation

## 2016-10-16 DIAGNOSIS — L309 Dermatitis, unspecified: Secondary | ICD-10-CM | POA: Insufficient documentation

## 2016-10-27 ENCOUNTER — Telehealth: Payer: Self-pay | Admitting: Internal Medicine

## 2016-10-27 NOTE — Telephone Encounter (Signed)
Caller name:  Relation to ZO:XWRUpt:self Call back [phone] Pharmacy:  Reason for call: pt would like to know if you will except her as a new pt, [place] States Dr. Drue NovelPaz sees her mom and her father and she would like to stay with them since she brings them to all of their appts and she likes Dr. Drue NovelPaz.

## 2016-10-27 NOTE — Telephone Encounter (Signed)
That is okay, set an appointment at the patient's convenience. We need to get records from her previous PCP

## 2016-10-27 NOTE — Telephone Encounter (Signed)
Please advise 

## 2016-10-27 NOTE — Telephone Encounter (Signed)
Contacted the patient informed her to call the office to make appt

## 2016-10-31 DIAGNOSIS — F411 Generalized anxiety disorder: Secondary | ICD-10-CM | POA: Diagnosis not present

## 2016-10-31 DIAGNOSIS — F250 Schizoaffective disorder, bipolar type: Secondary | ICD-10-CM | POA: Diagnosis not present

## 2016-10-31 DIAGNOSIS — G0481 Other encephalitis and encephalomyelitis: Secondary | ICD-10-CM | POA: Diagnosis not present

## 2016-11-01 DIAGNOSIS — L239 Allergic contact dermatitis, unspecified cause: Secondary | ICD-10-CM | POA: Diagnosis not present

## 2016-11-14 DIAGNOSIS — L280 Lichen simplex chronicus: Secondary | ICD-10-CM | POA: Diagnosis not present

## 2016-11-14 DIAGNOSIS — B86 Scabies: Secondary | ICD-10-CM | POA: Diagnosis not present

## 2016-11-14 DIAGNOSIS — R21 Rash and other nonspecific skin eruption: Secondary | ICD-10-CM | POA: Diagnosis not present

## 2016-11-14 DIAGNOSIS — L981 Factitial dermatitis: Secondary | ICD-10-CM | POA: Diagnosis not present

## 2016-11-28 ENCOUNTER — Encounter: Payer: Self-pay | Admitting: Internal Medicine

## 2016-11-28 ENCOUNTER — Ambulatory Visit (INDEPENDENT_AMBULATORY_CARE_PROVIDER_SITE_OTHER): Payer: Medicare Other | Admitting: Internal Medicine

## 2016-11-28 VITALS — BP 134/78 | HR 90 | Temp 97.9°F | Resp 14 | Ht 65.0 in | Wt 172.5 lb

## 2016-11-28 DIAGNOSIS — E785 Hyperlipidemia, unspecified: Secondary | ICD-10-CM

## 2016-11-28 DIAGNOSIS — I10 Essential (primary) hypertension: Secondary | ICD-10-CM

## 2016-11-28 DIAGNOSIS — Z09 Encounter for follow-up examination after completed treatment for conditions other than malignant neoplasm: Secondary | ICD-10-CM | POA: Insufficient documentation

## 2016-11-28 DIAGNOSIS — D649 Anemia, unspecified: Secondary | ICD-10-CM

## 2016-11-28 DIAGNOSIS — R739 Hyperglycemia, unspecified: Secondary | ICD-10-CM

## 2016-11-28 NOTE — Patient Instructions (Signed)
GO TO THE FRONT DESK Schedule labs to be done 2 weeks from today: FLP, CMP, A1c  Schedule your next appointment for a checkup with me in 6 months  If you need refills on amlodipine or atorvastatin please call  your pharmacy

## 2016-11-28 NOTE — Progress Notes (Signed)
Subjective:    Patient ID: Chelsea BostonJulissa [name], female    DOB: Jan 09, 1971, 46 y.o.   MRN: [id number]  DOS:  11/28/2016 Type of visit - description : New patient, to get established, used to see Dr Jordan HawksLLibre in [place] Interval history: The patient was admitted to the hospital due to bipolar disorder, discharged home 10/21/2016. At the time her cholesterol was noted to be elevated and was prescribed cholesterol medication. Also BP was elevated and was prescribed BP medications for the first time. Good compliance without apparent side effects.   Review of Systems Denies unusual aches and pains. No nausea, vomiting or abdominal pain.  From time to time sees a small amount of red blood after a bowel movement, blood is in the toilet paper. Periods are monthly, heavy, they last 10-12 days.  Not on birth control pills.  Past Medical History:  Diagnosis Date  . Allergy   . Bipolar disorder (HCC)   . Depression   . Eczema   . Elevated cholesterol   . Hypertension   . Schizophrenia [facility](HCC)     Past Surgical History:  Procedure Laterality Date  . NO PAST SURGERIES      Social History   Socioeconomic History  . Marital status: Divorced    Spouse name: Not on file  . Number of children: Not on file  . Years of education: Not on file  . Highest education level: Not on file  Social Needs  . Financial resource strain: Not on file  . Food insecurity - worry: Not on file  . Food insecurity - inability: Not on file  . Transportation needs - medical: Not on file  . Transportation needs - non-medical: Not on file  Occupational History  . Occupation: disability   Tobacco Use  . Smoking status: Current Every Day Smoker    Packs/day: 1.50    Types: Cigarettes  . Smokeless tobacco: Never Used  Substance and Sexual Activity  . Alcohol use: Yes    Frequency: Never    Comment:  socially , rare   . Drug use: No  . Sexual activity: No  Other Topics Concern  . Not on file  Social History  Narrative   [name]   Lives w/ parents    Father Hardie PulleyMiguel Rote      Family History  Problem Relation Age of Onset  . Breast cancer Mother   . Hypertension Father   . Diabetes Paternal Grandmother   . Colon cancer Neg Hx      Allergies as of 11/28/2016      Reactions   Divalproex Sodium Er Other (See Comments)   Delirium with hyperammonemia   Aspirin    REACTION: swelling      Medication List        Accurate as of 11/28/16  6:05 PM. Always use your most recent med list.          amLODipine 5 MG tablet Commonly known as:  NORVASC Take 1 tablet by mouth daily.   atorvastatin 20 MG tablet Commonly known as:  LIPITOR Take 20 mg by mouth at bedtime.   benztropine 0.5 MG tablet Commonly known as:  COGENTIN Take 1 tablet by mouth daily.   risperiDONE 3 MG tablet Commonly known as:  RISPERDAL Take 3 mg by mouth daily.   trazodone 300 MG tablet Commonly known as:  DESYREL Take 300 mg by mouth at bedtime.          Objective:   Physical Exam BP  134/78 (BP Location: Left Arm, Patient Position: Sitting, Cuff Size: Small)   Pulse 90   Temp 97.9 F (36.6 C) (Oral)   Resp 14   Ht 5\' 5"  (1.651 m)   Wt 172 lb 8 oz (78.2 kg)   SpO2 96%   BMI 28.71 kg/m  General:   Well developed, well nourished . NAD.  HEENT:  Normocephalic . Face symmetric, atraumatic Neck: No thyromegaly Lungs:  CTA B Normal respiratory effort, no intercostal retractions, no accessory muscle use. Heart: RRR,  no murmur.  No pretibial edema bilaterally  Skin: Not pale. Not jaundice Neurologic:  alert & oriented X3.  Speech normal, gait appropriate for age and unassisted Psych--  Cognition and judgment appear intact.  Cooperative with normal attention span and concentration.  Behavior appropriate. No anxious or depressed appearing.      Assessment & Plan:   Assessment HTN Hyperlipidemia Bipolar disorder; schizophrenia (dx as teenager); [facility]  [facility]. [facility]  PLAN: Labs October 2019 during an admission: Hemoglobin 11.7, platelets 338, white count 8.5 Total cholesterol 279, LDL 195, HDL 53 Creatinine 0.6, potassium 4.2, LFTs normal, TSH 1.2, beta hCG negative HTN: BP noted to be elevated during the last admission due to bipolar disorder few weeks ago, started amlodipine, BP today is very good, no change.  Check a CMP High cholesterol: H/o elevated cholesterol for a while, cholesterol was noted to be elevated a few weeks ago during  An admission, started on atorvastatin with good compliance and no apparent s/e.  Will check FLP, A1c. Anemia: Was noted to have mild anemia during the last admission, will check a CBC, iron and ferritin. Periods are really heavy, that may account for some of the anemia.  He occasionally sees red blood per rectum, benign issue per sx description.  If he has severe iron deficiency anemia will consider further eval. RTC fasting 2 weeks for blood work: FLP, CMP, A1c CBC, iron, ferritin. RTC for a routine visit 6 months from today

## 2016-11-28 NOTE — Progress Notes (Signed)
ipPre visit review using our clinic review tool, if applicable. No additional management support is needed unless otherwise documented below in the visit note.

## 2016-11-28 NOTE — Assessment & Plan Note (Signed)
Labs October 2019 during an admission: Hemoglobin 11.7, platelets 338, white count 8.5 Total cholesterol 279, LDL 195, HDL 53 Creatinine 0.6, potassium 4.2, LFTs normal, TSH 1.2, beta hCG negative HTN: BP noted to be elevated during the last admission due to bipolar disorder few weeks ago, started amlodipine, BP today is very good, no change.  Check a CMP High cholesterol: H/o elevated cholesterol for a while, cholesterol was noted to be elevated a few weeks ago during  An admission, started on atorvastatin with good compliance and no apparent s/e.  Will check FLP, A1c. Anemia: Was noted to have mild anemia during the last admission, will check a CBC, iron and ferritin. Periods are really heavy, that may account for some of the anemia.  He occasionally sees red blood per rectum, benign issue per sx description.  If he has severe iron deficiency anemia will consider further eval. RTC fasting 2 weeks for blood work: FLP, CMP, A1c CBC, iron, ferritin. RTC for a routine visit 6 months from today

## 2016-12-05 DIAGNOSIS — L239 Allergic contact dermatitis, unspecified cause: Secondary | ICD-10-CM | POA: Diagnosis not present

## 2016-12-05 DIAGNOSIS — B86 Scabies: Secondary | ICD-10-CM | POA: Diagnosis not present

## 2016-12-13 ENCOUNTER — Other Ambulatory Visit: Payer: Medicare Other

## 2016-12-14 ENCOUNTER — Other Ambulatory Visit (INDEPENDENT_AMBULATORY_CARE_PROVIDER_SITE_OTHER): Payer: Medicare Other

## 2016-12-14 DIAGNOSIS — I10 Essential (primary) hypertension: Secondary | ICD-10-CM

## 2016-12-14 DIAGNOSIS — D649 Anemia, unspecified: Secondary | ICD-10-CM

## 2016-12-14 DIAGNOSIS — R739 Hyperglycemia, unspecified: Secondary | ICD-10-CM | POA: Diagnosis not present

## 2016-12-14 DIAGNOSIS — E785 Hyperlipidemia, unspecified: Secondary | ICD-10-CM

## 2016-12-14 LAB — COMPREHENSIVE METABOLIC PANEL
ALK PHOS: 77 U/L (ref 39–117)
ALT: 20 U/L (ref 0–35)
AST: 19 U/L (ref 0–37)
Albumin: 3.9 g/dL (ref 3.5–5.2)
BILIRUBIN TOTAL: 0.5 mg/dL (ref 0.2–1.2)
BUN: 12 mg/dL (ref 6–23)
CO2: 25 mEq/L (ref 19–32)
CREATININE: 0.78 mg/dL (ref 0.40–1.20)
Calcium: 8.7 mg/dL (ref 8.4–10.5)
Chloride: 105 mEq/L (ref 96–112)
GFR: 84.46 mL/min (ref >60.00)
GLUCOSE: 85 mg/dL (ref 70–99)
Potassium: 3.8 mEq/L (ref 3.5–5.1)
SODIUM: 137 meq/L (ref 135–145)
TOTAL PROTEIN: 6.9 g/dL (ref 6.0–8.3)

## 2016-12-14 LAB — LIPID PANEL
CHOL/HDL RATIO: 5
Cholesterol: 173 mg/dL (ref 0–200)
HDL: 36.7 mg/dL — ABNORMAL LOW (ref >39.00)
NONHDL: 135.96
Triglycerides: 247 mg/dL — ABNORMAL HIGH (ref 0.0–149.0)
VLDL: 49.4 mg/dL — ABNORMAL HIGH (ref 0.0–40.0)

## 2016-12-14 LAB — CBC WITH DIFFERENTIAL/PLATELET
BASOS ABS: 0.1 10*3/uL (ref 0.0–0.1)
Basophils Relative: 1.3 % (ref 0.0–3.0)
EOS ABS: 0.7 10*3/uL (ref 0.0–0.7)
Eosinophils Relative: 9 % — ABNORMAL HIGH (ref 0.0–5.0)
HCT: 36.7 % (ref 36.0–46.0)
Hemoglobin: 11.8 g/dL — ABNORMAL LOW (ref 12.0–15.0)
LYMPHS ABS: 2.1 10*3/uL (ref 0.7–4.0)
LYMPHS PCT: 27.9 % (ref 12.0–46.0)
MCHC: 32.2 g/dL (ref 30.0–36.0)
MCV: 84.7 fl (ref 78.0–100.0)
MONOS PCT: 6.9 % (ref 3.0–12.0)
Monocytes Absolute: 0.5 10*3/uL (ref 0.1–1.0)
NEUTROS PCT: 54.9 % (ref 43.0–77.0)
Neutro Abs: 4.1 10*3/uL (ref 1.4–7.7)
Platelets: 347 10*3/uL (ref 150.0–400.0)
RBC: 4.33 Mil/uL (ref 3.87–5.11)
RDW: 17.8 % — ABNORMAL HIGH (ref 11.5–15.5)
WBC: 7.5 10*3/uL (ref 4.0–10.5)

## 2016-12-14 LAB — HEMOGLOBIN A1C: Hgb A1c MFr Bld: 5.6 % (ref 4.6–6.5)

## 2016-12-14 LAB — IRON: IRON: 28 ug/dL — AB (ref 42–145)

## 2016-12-14 LAB — FERRITIN: FERRITIN: 4.9 ng/mL — AB (ref 10.0–291.0)

## 2016-12-14 LAB — LDL CHOLESTEROL, DIRECT: LDL DIRECT: 101 mg/dL

## 2016-12-28 ENCOUNTER — Telehealth: Payer: Self-pay | Admitting: Internal Medicine

## 2016-12-28 MED ORDER — FERROUS SULFATE 325 (65 FE) MG PO TABS
325.0000 mg | ORAL_TABLET | Freq: Two times a day (BID) | ORAL | 6 refills | Status: DC
Start: 2016-12-28 — End: 2017-11-14

## 2016-12-28 MED ORDER — AMLODIPINE BESYLATE 5 MG PO TABS: 5.0000 mg | ORAL_TABLET | Freq: Every day | ORAL | 6 refills | Status: DC

## 2016-12-28 MED ORDER — ATORVASTATIN CALCIUM 20 MG PO TABS: 20.0000 mg | ORAL_TABLET | Freq: Every day | ORAL | 6 refills | Status: DC

## 2016-12-28 NOTE — Addendum Note (Signed)
Addended byConrad [place]: Meilani Edmundson D on: 12/28/2016 12:48 PM   Modules accepted: Orders

## 2016-12-28 NOTE — Telephone Encounter (Signed)
Copied from CRM [phone]. Topic: Quick Communication - See Telephone Encounter >> Dec 28, 2016  8:33 AM Clack, Princella PellegriniJessica D wrote: CRM for notification. See Telephone encounter for: Pt calling for lab results. Updated number on file, please f/u with pt.  12/28/16.

## 2016-12-28 NOTE — Telephone Encounter (Signed)
Notes recorded by Conrad Burlingtonanter, Ilka Lovick, CMA on 12/28/2016 at 12:48 PM EST Pt called regarding labs- number updated in chart. Informed of labs and recommendations. [facility] Pt verbalized understanding. ------  Notes recorded by Conrad Burlingtonanter, Traver Meckes, CMA on 12/21/2016 at 4:07 PM EST Phone number not in service. Can you reach out to her father Irving ShowsMiguel for her new number (he only speaks spanish). ------  Notes recorded by Wanda PlumpPaz, Jose E, MD on 12/21/2016 at 4:02 PM EST Advised patient: Cholesterol has responded very well to atorvastatin. Continue w/ it, RF if needed She has mild anemia with iron deficiency, recommend to take FeSO4 325 mg 1 tablet twice a day on empty stomach. #60 and 6 refills. Take 1 multivitamin daily. anemia likely due to heavy periods however will consider further testing. Other labs are very good.

## 2017-01-16 DIAGNOSIS — F411 Generalized anxiety disorder: Secondary | ICD-10-CM | POA: Diagnosis not present

## 2017-01-16 DIAGNOSIS — F250 Schizoaffective disorder, bipolar type: Secondary | ICD-10-CM | POA: Diagnosis not present

## 2017-03-17 ENCOUNTER — Ambulatory Visit (INDEPENDENT_AMBULATORY_CARE_PROVIDER_SITE_OTHER): Payer: Medicare Other | Admitting: Family Medicine

## 2017-03-17 DIAGNOSIS — H1033 Unspecified acute conjunctivitis, bilateral: Secondary | ICD-10-CM

## 2017-03-17 DIAGNOSIS — L309 Dermatitis, unspecified: Secondary | ICD-10-CM | POA: Diagnosis not present

## 2017-03-17 MED ORDER — ERYTHROMYCIN 5 MG/GM OP OINT: TOPICAL_OINTMENT | OPHTHALMIC | 0 refills | Status: DC

## 2017-03-17 NOTE — Patient Instructions (Signed)
Nice to see you. Please try the eye ointment to see if that helps with your symptoms. Please contact her dermatologist for your rash. If you develop eye pain, vision changes, or light sensitivity please be evaluated immediately.

## 2017-03-17 NOTE — Progress Notes (Signed)
  Marikay AlarEric Sonnenberg, MD Phone: [phone]  Duane BostonJulissa Brouillard is a 47 y.o. female who presents today for same-day visit.  Patient notes onset of itchy crusty eyes with drainage from her left eye that was yellow in nature starting yesterday.  Notes her right eye is starting to bother her today.  She notes a gritty sensation though no photophobia, vision changes, or severe pain.  She notes a little bit of cough though no rhinorrhea or sneezing or congestion.  She has been using warm compresses.  Also using Tylenol.  She reports she has intermittently had a rash on her hands and wrists as well as her abdomen.  She saw dermatology and was told it was scabies initially though she was treated twice and it did not improved.  She was subsequently advised it was eczema and she used topical steroid  and that did help it improve though it has returned on her hands and wrists.  It does itch.  Social History   Tobacco Use  Smoking Status Current Every Day Smoker  . Packs/day: 1.50  . Types: Cigarettes  Smokeless Tobacco Never Used     ROS see history of present illness  Objective  Physical Exam Vitals:   03/17/17 1113  BP: (!) 132/92  Pulse: 98  Temp: 98.2 F (36.8 C)  SpO2: 95%    BP Readings from Last 3 Encounters:  03/17/17 (!) 132/92  11/28/16 134/78  01/12/16 138/88   Wt Readings from Last 3 Encounters:  03/17/17 184 lb 12.8 oz (83.8 kg)  11/28/16 172 lb 8 oz (78.2 kg)  01/12/16 174 lb (78.9 kg)    Physical Exam  Constitutional: No distress.  Eyes: Pupils are equal, round, and reactive to light.  Conjunctival injection left greater than right, normal gross changes of the cornea, minimal clear discharge noted  Cardiovascular: Normal rate, regular rhythm and normal heart sounds.  Pulmonary/Chest: Effort normal and breath sounds normal.  Musculoskeletal: She exhibits no edema.  Neurological: She is alert. Gait normal.  Skin: Skin is warm and dry. She is not diaphoretic.    Thickened skin with excoriation at bilateral wrists on the volar aspect along with thickened excoriated skin and scattered patches on bilateral hands     Assessment/Plan: Please see individual problem list.  Conjunctivitis No red flags on exam or history.  Potentially viral conjunctivitis though will cover empirically with erythromycin ophthalmic ointment.  She will monitor.  If not improving she will follow-up.  Given return precautions.  Eczema of both upper extremities Rash appears most consistent with eczema.  I did offer her topical steroids that she declined.  She noted she would contact her dermatologist on Monday for follow-up.   No orders of the defined types were placed in this encounter.   Meds ordered this encounter  Medications  . erythromycin ophthalmic ointment    Sig: Apply 0.5 inch 4 times per days to both eyes for 5 days    Dispense:  10.5 g    Refill:  0     Marikay AlarEric Sonnenberg, MD [facility]

## 2017-03-17 NOTE — Assessment & Plan Note (Signed)
No red flags on exam or history.  Potentially viral conjunctivitis though will cover empirically with erythromycin ophthalmic ointment.  She will monitor.  If not improving she will follow-up.  Given return precautions.

## 2017-03-17 NOTE — Assessment & Plan Note (Signed)
Rash appears most consistent with eczema.  I did offer her topical steroids that she declined.  She noted she would contact her dermatologist on Monday for follow-up.

## 2017-04-23 DIAGNOSIS — L2082 Flexural eczema: Secondary | ICD-10-CM | POA: Diagnosis not present

## 2017-04-23 DIAGNOSIS — L301 Dyshidrosis [pompholyx]: Secondary | ICD-10-CM | POA: Diagnosis not present

## 2017-05-16 DIAGNOSIS — E7800 Pure hypercholesterolemia, unspecified: Secondary | ICD-10-CM | POA: Diagnosis not present

## 2017-05-16 DIAGNOSIS — F411 Generalized anxiety disorder: Secondary | ICD-10-CM | POA: Diagnosis not present

## 2017-05-16 DIAGNOSIS — E8881 Metabolic syndrome: Secondary | ICD-10-CM | POA: Diagnosis not present

## 2017-05-16 DIAGNOSIS — F250 Schizoaffective disorder, bipolar type: Secondary | ICD-10-CM | POA: Diagnosis not present

## 2017-05-29 ENCOUNTER — Ambulatory Visit (INDEPENDENT_AMBULATORY_CARE_PROVIDER_SITE_OTHER): Payer: Medicare Other | Admitting: Internal Medicine

## 2017-05-29 ENCOUNTER — Encounter: Payer: Self-pay | Admitting: Internal Medicine

## 2017-05-29 VITALS — BP 116/72 | HR 81 | Temp 98.0°F | Resp 14 | Ht 65.0 in | Wt 182.5 lb

## 2017-05-29 DIAGNOSIS — E785 Hyperlipidemia, unspecified: Secondary | ICD-10-CM

## 2017-05-29 DIAGNOSIS — D649 Anemia, unspecified: Secondary | ICD-10-CM | POA: Diagnosis not present

## 2017-05-29 DIAGNOSIS — I10 Essential (primary) hypertension: Secondary | ICD-10-CM

## 2017-05-29 NOTE — Patient Instructions (Signed)
  GO TO THE FRONT DESK Schedule your next appointment for a  Check up in 6 months    Check the  blood pressure   weekly   Be sure your blood pressure is between 110/65 and  135/85. If it is consistently higher or lower, let me know

## 2017-05-29 NOTE — Progress Notes (Signed)
Pre visit review using our clinic review tool, if applicable. No additional management support is needed unless otherwise documented below in the visit note. 

## 2017-05-29 NOTE — Progress Notes (Signed)
Subjective:    Patient ID: Chelsea [name], female    DOB: 1970/11/02, 47 y.o.   MRN: [id number]  DOS:  05/29/2017 Type of visit - description : Follow-up Interval history: Here with her mother. Anemia: Good compliance with medications, no apparent side effects. Medications for bipolar has slightly changed, list reviewed. Complaint of fatigue some days.   Review of Systems Denies chest pain, difficulty breathing or pretibial edema, occasional pedal edema No nausea, vomiting.  Very rarely has diarrhea, stools are soft and nonbloody.   Reports that she snores sometimes, her sleep pattern has changed, sleeping more during the day and less at night.  She already discussed issue with psychiatry.   Periods are regular, monthly, last 7 to 8 days, heavy the first 3 days.  Past Medical History:  Diagnosis Date  . Allergy   . Bipolar disorder (HCC)   . Depression   . Eczema   . Elevated cholesterol   . Hypertension   . Schizophrenia [facility])     Past Surgical History:  Procedure Laterality Date  . NO PAST SURGERIES      Social History   Socioeconomic History  . Marital status: Divorced    Spouse name: Not on file  . Number of children: Not on file  . Years of education: Not on file  . Highest education level: Not on file  Occupational History  . Occupation: disability   Social Needs  . Financial resource strain: Not on file  . Food insecurity:    Worry: Not on file    Inability: Not on file  . Transportation needs:    Medical: Not on file    Non-medical: Not on file  Tobacco Use  . Smoking status: Current Every Day Smoker    Packs/day: 1.50    Types: Cigarettes  . Smokeless tobacco: Never Used  Substance and Sexual Activity  . Alcohol use: Yes    Frequency: Never    Comment:  socially , rare   . Drug use: No  . Sexual activity: Never  Lifestyle  . Physical activity:    Days per week: Not on file    Minutes per session: Not on file  . Stress: Not on file    Relationships  . Social connections:    Talks on phone: Not on file    Gets together: Not on file    Attends religious service: Not on file    Active member of club or organization: Not on file    Attends meetings of clubs or organizations: Not on file    Relationship status: Not on file  . Intimate partner violence:    Fear of current or ex partner: Not on file    Emotionally abused: Not on file    Physically abused: Not on file    Forced sexual activity: Not on file  Other Topics Concern  . Not on file  Social History Narrative   [name]   Lives w/ parents    Father Casy Tavano       Allergies as of 05/29/2017      Reactions   Divalproex Sodium Er Other (See Comments)   Delirium with hyperammonemia   Aspirin    REACTION: swelling      Medication List        Accurate as of 05/29/17 11:59 PM. Always use your most recent med list.          amLODipine 5 MG tablet Commonly known as:  NORVASC Take 1  tablet (5 mg total) by mouth daily.   ARIPiprazole 10 MG tablet Commonly known as:  ABILIFY Take 10 mg by mouth daily.   atorvastatin 20 MG tablet Commonly known as:  LIPITOR Take 1 tablet (20 mg total) by mouth at bedtime.   benztropine 0.5 MG tablet Commonly known as:  COGENTIN Take 1 tablet by mouth daily.   clobetasol ointment 0.05 % Commonly known as:  TEMOVATE As directed   clonazePAM 1 MG tablet Commonly known as:  KLONOPIN Take 1 mg by mouth 2 (two) times daily as needed.   ferrous sulfate 325 (65 FE) MG tablet Take 1 tablet (325 mg total) by mouth 2 (two) times daily. On an empty stomach   trazodone 300 MG tablet Commonly known as:  DESYREL Take 300 mg by mouth at bedtime.   triamcinolone cream 0.1 % Commonly known as:  KENALOG As directed          Objective:   Physical Exam BP 116/72 (BP Location: Left Arm, Patient Position: Sitting, Cuff Size: Small)   Pulse 81   Temp 98 F (36.7 C) (Oral)   Resp 14   Ht  (1.651 m)   Wt 182 lb  8 oz (82.8 kg)   SpO2 98%   BMI 30.37 kg/m  General:   Well developed, well nourished . NAD.  HEENT:  Normocephalic . Face symmetric, atraumatic Lungs:  CTA B Normal respiratory effort, no intercostal retractions, no accessory muscle use. Heart: RRR,  no murmur.  No pretibial edema bilaterally  Skin: Not pale. Not jaundice Neurologic:  alert & oriented X3.  Speech normal, gait appropriate for age and unassisted Psych--  Cognition and judgment appear intact.  Cooperative with normal attention span and concentration.  Behavior appropriate. Slightly apprehensive appearing.     Assessment & Plan:   Assessment HTN Hyperlipidemia Bipolar disorder; schizophrenia (dx as teenager); Dr Lorra Hals [facility])  Eczema. [facility]) Tobacco abuse   PLAN: HTN: Well-controlled on amlodipine, encouraged to check ambulatory BPs b/c if BPs are low that may account for her fatigue.  She will let me know High cholesterol: Very good response to Lipitor.  No change. Anemia: Last hemoglobin 11.8 with both iron and ferritin decreased.  Now on supplements.  Declined that blood testing today.  We will continue iron, vitamins.  Anemia most likely related to heavy menses. RTC 6 months

## 2017-05-30 NOTE — Assessment & Plan Note (Signed)
HTN: Well-controlled on amlodipine, encouraged to check ambulatory BPs b/c if BPs are low that may account for her fatigue.  She will let me know High cholesterol: Very good response to Lipitor.  No change. Anemia: Last hemoglobin 11.8 with both iron and ferritin decreased.  Now on supplements.  Declined that blood testing today.  We will continue iron, vitamins.  Anemia most likely related to heavy menses. RTC 6 months

## 2017-07-10 DIAGNOSIS — L2082 Flexural eczema: Secondary | ICD-10-CM | POA: Diagnosis not present

## 2017-07-10 DIAGNOSIS — L579 Skin changes due to chronic exposure to nonionizing radiation, unspecified: Secondary | ICD-10-CM | POA: Diagnosis not present

## 2017-07-10 DIAGNOSIS — L981 Factitial dermatitis: Secondary | ICD-10-CM | POA: Diagnosis not present

## 2017-08-07 DIAGNOSIS — L2082 Flexural eczema: Secondary | ICD-10-CM | POA: Diagnosis not present

## 2017-08-20 ENCOUNTER — Telehealth: Payer: Self-pay | Admitting: *Deleted

## 2017-08-20 NOTE — Telephone Encounter (Signed)
Received request for Medical Records from Rachael DarbyLuis Betancourt, MD [facility], [facility]; forwarded to [place] for email/scan/SLS 08/12

## 2017-08-29 ENCOUNTER — Encounter: Payer: Self-pay | Admitting: Internal Medicine

## 2017-08-29 ENCOUNTER — Ambulatory Visit (INDEPENDENT_AMBULATORY_CARE_PROVIDER_SITE_OTHER): Payer: Medicare Other | Admitting: Internal Medicine

## 2017-08-29 VITALS — BP 118/84 | HR 77 | Temp 97.7°F | Resp 14 | Ht 65.0 in | Wt 181.4 lb

## 2017-08-29 DIAGNOSIS — F250 Schizoaffective disorder, bipolar type: Secondary | ICD-10-CM

## 2017-08-29 DIAGNOSIS — I10 Essential (primary) hypertension: Secondary | ICD-10-CM

## 2017-08-29 MED ORDER — CLONIDINE HCL 0.1 MG PO TABS: 0.1000 mg | ORAL_TABLET | Freq: Two times a day (BID) | ORAL | 6 refills | Status: DC

## 2017-08-29 NOTE — Progress Notes (Signed)
Subjective:    Patient ID: Chelsea BostonJulissa [name], female    DOB: [birth year], 47 y.o.   MRN: [id number]  DOS:  08/29/2017 Type of visit - description : To discuss blood pressure Interval history:  Went to see her psychiatrist about a month ago, BP was 170/102, was prescribed clonidine. No ambulatory BPs since then but good compliance and good tolerance of meds. Wonders if she needs to continue with clonidine    Review of Systems Denies chest pain, difficulty breathing. Occasionally sees edema around the ankles but not frequently Had a headache at the time BP was elevated but that is largely resolved.  Past Medical History:  Diagnosis Date  . Allergy   . Bipolar disorder (HCC)   . Depression   . Eczema   . Elevated cholesterol   . Hypertension   . Schizophrenia [place](HCC)     Past Surgical History:  Procedure Laterality Date  . NO PAST SURGERIES      Social History   Socioeconomic History  . Marital status: Divorced    Spouse name: Not on file  . Number of children: Not on file  . Years of education: Not on file  . Highest education level: Not on file  Occupational History  . Occupation: disability   Social Needs  . Financial resource strain: Not on file  . Food insecurity:    Worry: Not on file    Inability: Not on file  . Transportation needs:    Medical: Not on file    Non-medical: Not on file  Tobacco Use  . Smoking status: Current Every Day Smoker    Packs/day: 1.50    Types: Cigarettes  . Smokeless tobacco: Never Used  Substance and Sexual Activity  . Alcohol use: Yes    Frequency: Never    Comment:  socially , rare   . Drug use: No  . Sexual activity: Never  Lifestyle  . Physical activity:    Days per week: Not on file    Minutes per session: Not on file  . Stress: Not on file  Relationships  . Social connections:    Talks on phone: Not on file    Gets together: Not on file    Attends religious service: Not on file    Active member of club or  organization: Not on file    Attends meetings of clubs or organizations: Not on file    Relationship status: Not on file  . Intimate partner violence:    Fear of current or ex partner: Not on file    Emotionally abused: Not on file    Physically abused: Not on file    Forced sexual activity: Not on file  Other Topics Concern  . Not on file  Social History Narrative   [name]   Lives w/ parents    Father Chelsea PulleyMiguel [name]       Allergies as of 08/29/2017      Reactions   Divalproex Sodium Er Other (See Comments)   Delirium with hyperammonemia   Aspirin    REACTION: swelling      Medication List        Accurate as of 08/29/17  3:35 PM. Always use your most recent med list.          amLODipine 5 MG tablet Commonly known as:  NORVASC Take 1 tablet (5 mg total) by mouth daily.   ARIPiprazole 10 MG tablet Commonly known as:  ABILIFY Take 5 mg by mouth daily.  atorvastatin 20 MG tablet Commonly known as:  LIPITOR Take 1 tablet (20 mg total) by mouth at bedtime.   benztropine 0.5 MG tablet Commonly known as:  COGENTIN Take 1 tablet by mouth daily.   clobetasol ointment 0.05 % Commonly known as:  TEMOVATE As directed   clonazePAM 1 MG tablet Commonly known as:  KLONOPIN Take 1 mg by mouth 2 (two) times daily as needed.   cloNIDine 0.1 MG tablet Commonly known as:  CATAPRES Take 0.1 mg by mouth 2 (two) times daily.   ferrous sulfate 325 (65 FE) MG tablet Take 1 tablet (325 mg total) by mouth 2 (two) times daily. On an empty stomach   trazodone 300 MG tablet Commonly known as:  DESYREL Take 300 mg by mouth at bedtime.   triamcinolone cream 0.1 % Commonly known as:  KENALOG As directed   VRAYLAR capsule Generic drug:  cariprazine Take 1.5 mg by mouth daily.          Objective:   Physical Exam BP 118/84 (BP Location: Left Arm, Patient Position: Sitting, Cuff Size: Small)   Pulse 77   Temp 97.7 F (36.5 C) (Oral)   Resp 14   Ht 5\' 5"  (1.651 m)   Wt  181 lb 6 oz (82.3 kg)   LMP 08/22/2017 (Approximate)   SpO2 97%   BMI 30.18 kg/m  General:   Well developed, NAD, see BMI.  HEENT:  Normocephalic . Face symmetric, atraumatic Lungs:  CTA B Normal respiratory effort, no intercostal retractions, no accessory muscle use. Heart: RRR,  no murmur.  No pretibial edema bilaterally  Skin: Not pale. Not jaundice Neurologic:  alert & oriented X3.  Speech normal, gait appropriate for age and unassisted Psych--  Cognition and judgment appear intact.  Cooperative with normal attention span and concentration.  Behavior appropriate. No anxious or depressed appearing.      Assessment & Plan:   Assessment HTN Hyperlipidemia Bipolar disorder; schizophrenia (dx as teenager); [facility]als[facility]ourt [facility]  Eczema. [facility]) Tobacco abuse   PLAN: HTN: On amlodipine, recent BP was elevated, started clonidine BID, BP today is very good, no apparent side effects from the new medication.  She was told clonidine may help with anxiety as well. Plan: Continue same meds, check a BMP, monitor BPs, low-salt diet. Bipolar, schizophrenia: Her mother inquires about a local psychiatrist [place] [facility] [place]; a list of local MDs was provided but advised patient that if she is satisfied with her psych  and is not very difficult to go there she should stick with the same doctor. RTC as scheduled for November.

## 2017-08-29 NOTE — Patient Instructions (Addendum)
GO TO THE LAB : Get the blood work     See you in November   Check the  blood pressure 2 or 3 times a   week   Be sure your blood pressure is between 110/65 and  135/85. If it is consistently higher or lower, let me know

## 2017-08-29 NOTE — Progress Notes (Signed)
Pre visit review using our clinic review tool, if applicable. No additional management support is needed unless otherwise documented below in the visit note. 

## 2017-08-30 LAB — BASIC METABOLIC PANEL
BUN: 13 mg/dL (ref 6–23)
CHLORIDE: 102 meq/L (ref 96–112)
CO2: 30 mEq/L (ref 19–32)
Calcium: 9.3 mg/dL (ref 8.4–10.5)
Creatinine, Ser: 0.78 mg/dL (ref 0.40–1.20)
GFR: 84.2 mL/min (ref >60.00)
Glucose, Bld: 72 mg/dL (ref 70–99)
Potassium: 4.4 mEq/L (ref 3.5–5.1)
SODIUM: 137 meq/L (ref 135–145)

## 2017-08-30 NOTE — Assessment & Plan Note (Signed)
HTN: On amlodipine, recent BP was elevated, started clonidine BID, BP today is very good, no apparent side effects from the new medication.  She was told clonidine may help with anxiety as well. Plan: Continue same meds, check a BMP, monitor BPs, low-salt diet. Bipolar, schizophrenia: Her mother inquires about a local psychiatrist [place] [facility]; a list of local MDs was provided but advised patient that if she is satisfied with her psych  and is not very difficult to go there she should stick with the same doctor. RTC as scheduled for November.

## 2017-09-14 DIAGNOSIS — F419 Anxiety disorder, unspecified: Secondary | ICD-10-CM | POA: Diagnosis not present

## 2017-09-20 ENCOUNTER — Other Ambulatory Visit: Payer: Self-pay | Admitting: Internal Medicine

## 2017-09-27 DIAGNOSIS — F419 Anxiety disorder, unspecified: Secondary | ICD-10-CM | POA: Diagnosis not present

## 2017-10-15 DIAGNOSIS — F411 Generalized anxiety disorder: Secondary | ICD-10-CM | POA: Diagnosis not present

## 2017-10-15 DIAGNOSIS — G4700 Insomnia, unspecified: Secondary | ICD-10-CM | POA: Diagnosis not present

## 2017-11-06 ENCOUNTER — Encounter (HOSPITAL_COMMUNITY): Payer: Self-pay | Admitting: Obstetrics and Gynecology

## 2017-11-06 ENCOUNTER — Emergency Department (HOSPITAL_COMMUNITY)
Admission: EM | Admit: 2017-11-06 | Discharge: 2017-11-07 | Disposition: A | Discharge status: Other Acute Inpatient Hospital | Payer: Medicare Other | Attending: Emergency Medicine | Admitting: Emergency Medicine

## 2017-11-06 ENCOUNTER — Other Ambulatory Visit: Payer: Self-pay

## 2017-11-06 DIAGNOSIS — Z79899 Other long term (current) drug therapy: Secondary | ICD-10-CM | POA: Insufficient documentation

## 2017-11-06 DIAGNOSIS — Z046 Encounter for general psychiatric examination, requested by authority: Secondary | ICD-10-CM | POA: Insufficient documentation

## 2017-11-06 DIAGNOSIS — F411 Generalized anxiety disorder: Secondary | ICD-10-CM | POA: Diagnosis not present

## 2017-11-06 DIAGNOSIS — G4700 Insomnia, unspecified: Secondary | ICD-10-CM | POA: Diagnosis not present

## 2017-11-06 DIAGNOSIS — L299 Pruritus, unspecified: Secondary | ICD-10-CM | POA: Insufficient documentation

## 2017-11-06 DIAGNOSIS — F17210 Nicotine dependence, cigarettes, uncomplicated: Secondary | ICD-10-CM | POA: Insufficient documentation

## 2017-11-06 DIAGNOSIS — I10 Essential (primary) hypertension: Secondary | ICD-10-CM | POA: Insufficient documentation

## 2017-11-06 DIAGNOSIS — F250 Schizoaffective disorder, bipolar type: Secondary | ICD-10-CM | POA: Insufficient documentation

## 2017-11-06 DIAGNOSIS — F209 Schizophrenia, unspecified: Secondary | ICD-10-CM | POA: Diagnosis not present

## 2017-11-06 DIAGNOSIS — R45850 Homicidal ideations: Secondary | ICD-10-CM | POA: Insufficient documentation

## 2017-11-06 DIAGNOSIS — R45851 Suicidal ideations: Secondary | ICD-10-CM

## 2017-11-06 LAB — CBC
HCT: 43.7 % (ref 36.0–46.0)
HEMOGLOBIN: 14.2 g/dL (ref 12.0–15.0)
MCH: 30.8 pg (ref 26.0–34.0)
MCHC: 32.5 g/dL (ref 30.0–36.0)
MCV: 94.8 fL (ref 80.0–100.0)
Platelets: 342 10*3/uL (ref 150–400)
RBC: 4.61 MIL/uL (ref 3.87–5.11)
RDW: 13.2 % (ref 11.5–15.5)
WBC: 10 10*3/uL (ref 4.0–10.5)
nRBC: 0 % (ref 0.0–0.2)

## 2017-11-06 LAB — COMPREHENSIVE METABOLIC PANEL
ALT: 35 U/L (ref 0–44)
ANION GAP: 7 (ref 5–15)
AST: 26 U/L (ref 15–41)
Albumin: 3.9 g/dL (ref 3.5–5.0)
Alkaline Phosphatase: 72 U/L (ref 38–126)
BUN: 11 mg/dL (ref 6–20)
CALCIUM: 9 mg/dL (ref 8.9–10.3)
CHLORIDE: 106 mmol/L (ref 98–111)
CO2: 25 mmol/L (ref 22–32)
CREATININE: 0.67 mg/dL (ref 0.44–1.00)
Glucose, Bld: 80 mg/dL (ref 70–99)
Potassium: 4 mmol/L (ref 3.5–5.1)
SODIUM: 138 mmol/L (ref 135–145)
TOTAL PROTEIN: 7.3 g/dL (ref 6.5–8.1)
Total Bilirubin: 0.6 mg/dL (ref 0.3–1.2)

## 2017-11-06 LAB — I-STAT BETA HCG BLOOD, ED (MC, WL, AP ONLY): I-stat hCG, quantitative: <5 m[IU]/mL (ref <5)

## 2017-11-06 LAB — ETHANOL: Alcohol, Ethyl (B): <10 mg/dL (ref <10)

## 2017-11-06 LAB — SALICYLATE LEVEL

## 2017-11-06 LAB — ACETAMINOPHEN LEVEL: Acetaminophen (Tylenol), Serum: <10 ug/mL — ABNORMAL LOW (ref 10–30)

## 2017-11-06 MED ORDER — CLONIDINE HCL 0.1 MG PO TABS
0.1000 mg | ORAL_TABLET | Freq: Two times a day (BID) | ORAL | Status: DC
  Administered 2017-11-06 – 2017-11-07 (×2): 0.1 mg via ORAL
  Filled 2017-11-06 (×2): qty 1

## 2017-11-06 MED ORDER — NICOTINE POLACRILEX 2 MG MT GUM
2.0000 mg | CHEWING_GUM | OROMUCOSAL | Status: DC | PRN
  Administered 2017-11-06 – 2017-11-07 (×3): 2 mg via ORAL
  Filled 2017-11-06 (×4): qty 1

## 2017-11-06 MED ORDER — TRAZODONE HCL 100 MG PO TABS
300.0000 mg | ORAL_TABLET | Freq: Every day | ORAL | Status: DC
  Administered 2017-11-06: 300 mg via ORAL
  Filled 2017-11-06: qty 3

## 2017-11-06 MED ORDER — ARIPIPRAZOLE 5 MG PO TABS
5.0000 mg | ORAL_TABLET | Freq: Every day | ORAL | Status: DC
  Administered 2017-11-06 – 2017-11-07 (×2): 5 mg via ORAL
  Filled 2017-11-06 (×3): qty 1

## 2017-11-06 MED ORDER — DIPHENHYDRAMINE HCL 25 MG PO CAPS
50.0000 mg | ORAL_CAPSULE | Freq: Once | ORAL | Status: AC
  Administered 2017-11-06: 50 mg via ORAL
  Filled 2017-11-06: qty 2

## 2017-11-06 MED ORDER — AMLODIPINE BESYLATE 5 MG PO TABS
5.0000 mg | ORAL_TABLET | Freq: Every day | ORAL | Status: DC
  Administered 2017-11-06 – 2017-11-07 (×2): 5 mg via ORAL
  Filled 2017-11-06 (×3): qty 1

## 2017-11-06 MED ORDER — CARIPRAZINE HCL 1.5 MG PO CAPS
1.5000 mg | ORAL_CAPSULE | Freq: Every day | ORAL | Status: DC
  Administered 2017-11-07: 1.5 mg via ORAL
  Filled 2017-11-06: qty 1

## 2017-11-06 NOTE — ED Notes (Signed)
Pt alert and oriented, pt denies any pain or discomfort at this time. Pt denies any si, hi, and avh. Pt resting quietly in bed. Will continue to monitor.

## 2017-11-06 NOTE — ED Triage Notes (Signed)
Pt here with [employer]: Pt has been reportedly suicidal and homicidal with her family, threatening to kill herself and another family member.  Pt has been reportedly cooperative with [employer].

## 2017-11-06 NOTE — BH Assessment (Addendum)
Assessment Note  Chelsea [name] is an 47 y.o., single female. Pt presented to [facility] via [facility] under IVC by her family. Per report pt brought in due to SI and HI against a family member. Pt reports that she and her mother had a misunderstanding. Pt stated that she made a statement about wanting to die in a "joking manner" to her mother, and her mother then made statements about pt needing to see a doctor. Pt stated, "I knew this was coming. I was joking. I don't want to hurt myself or anybody." Pt denied SI/HI/AH/VH. Pt reports no depression, but stated that she has been sleeping less. Pt stated that she had an incident when she was crying a week ago, but denied any triggers. Pt stated that she was last hospitalized in February due to the same type of incident, suicidal ideations suspected by her mother. Pt stated that she was hospitalized in [place], [place]. Pt denied hx of self harm. Pt denied hx of suicide attempts. Pt denied SA. Pt reports currently being prescribed Trazodone, Abilify, Clonodine, and an antidepressant. Pt reports that she recently switched to a new psychiatrist and is not aware of the name of the practice. Pt denies current therapy.   Pt reports living with her parents. Pt reports receiving SSI and Medicare. Pt reports never having been married.   Pt oriented to person, place, time and situation. Pt presented alert, dressed appropriately and groomed. Pt spoke clearly, coherently and did not seem to be under the influence of any substances. Pt made good eye contact and answered questions appropriately. Pt presented euthymic, calm and open to the assessment process. Pt presented with no impairments of remote or recent memory. Pt was itching during assessment due to her report of eating fish. Clinician reported to nursing staff.   Diagnosis: Per History F25.0 Schizoaffective disorder, Bipolar type   Past Medical History:  Past Medical History:  Diagnosis Date  . Allergy   . Bipolar  disorder (HCC)   . Depression   . Eczema   . Elevated cholesterol   . Hypertension   . Schizophrenia [facility])     Past Surgical History:  Procedure Laterality Date  . NO PAST SURGERIES      Family History:  Family History  Problem Relation Age of Onset  . Breast cancer Mother   . Hypertension Father   . Diabetes Paternal Grandmother   . Colon cancer Neg Hx     Social History:  reports that she has been smoking cigarettes. She has been smoking about 1.50 packs per day. She has never used smokeless tobacco. She reports that she drinks alcohol. She reports that she does not use drugs.  Additional Social History:  Alcohol / Drug Use Pain Medications: SEE MAR. Prescriptions: Pt reports taking Trazodone, Abilify, Clonidine and an antidepressant.  Over the Counter: SEE MAR.  History of alcohol / drug use?: No history of alcohol / drug abuse  CIWA: CIWA-Ar BP: (!) 139/100(RN notified) Pulse Rate: 78 COWS:    Allergies:  Allergies  Allergen Reactions  . Divalproex Sodium Er Other (See Comments)    Delirium with hyperammonemia  . Aspirin     REACTION: swelling    Home Medications:  (Not in a hospital admission)  OB/GYN Status:  No LMP recorded.  General Assessment Data Location of Assessment: [facility] ED TTS Assessment: In system Is this a Tele or Face-to-Face Assessment?: Tele Assessment Is this an Initial Assessment or a Re-assessment for this encounter?: Initial Assessment Patient  Accompanied by:: Other(Pt brought by [employer]. ) Language Other than English: Yes What is your preferred language: Spanish Living Arrangements: Other (Comment)(Pt reports living with parents. ) What gender do you identify as?: Female Marital status: Single Maiden name: N/A Pregnancy Status: No Living Arrangements: Parent Can pt return to current living arrangement?: Yes Admission Status: Involuntary Petitioner: Family member Is patient capable of signing voluntary admission?: Yes Referral  Source: Self/Family/Friend Insurance type:  Medicare  Medical Screening Exam [facility] Walk-in ONLY) Medical Exam completed: Yes  Crisis Care Plan Living Arrangements: Parent Legal Guardian: Other:(Self ) Name of Psychiatrist: Pt reports having a new psychiatrist and cannot think of the name.  Name of Therapist: Denied.   Education Status Is patient currently in school?: No Is the patient employed, unemployed or receiving disability?: Receiving disability income  Risk to self with the past 6 months Suicidal Ideation: No Has patient been a risk to self within the past 6 months prior to admission? : No Suicidal Intent: No Has patient had any suicidal intent within the past 6 months prior to admission? : No Is patient at risk for suicide?: No Suicidal Plan?: No Has patient had any suicidal plan within the past 6 months prior to admission? : No Access to Means: No What has been your use of drugs/alcohol within the last 12 months?: Pt denied.  Previous Attempts/Gestures: No(Pt denied. ) How many times?: 0 Other Self Harm Risks: Pt denied.  Triggers for Past Attempts: Unknown(Pt denied. ) Intentional Self Injurious Behavior: None Family Suicide History: No Recent stressful life event(s): Other (Comment)(Pt denied. ) Persecutory voices/beliefs?: No Depression: No Depression Symptoms: Tearfulness(Pt reports one episode of crying 1 week ago. ) Substance abuse history and/or treatment for substance abuse?: No Suicide prevention information given to non-admitted patients: Not applicable  Risk to Others within the past 6 months Homicidal Ideation: No Does patient have any lifetime risk of violence toward others beyond the six months prior to admission? : No Thoughts of Harm to Others: No Current Homicidal Intent: No Current Homicidal Plan: No Access to Homicidal Means: No Identified Victim: Pt denied.  History of harm to others?: No Assessment of Violence: None Noted Violent Behavior  Description: N/A Does patient have access to weapons?: No Criminal Charges Pending?: No Does patient have a court date: No Is patient on probation?: No  Psychosis Hallucinations: None noted Delusions: None noted  Mental Status Report Appearance/Hygiene: Unremarkable, In scrubs Eye Contact: Good Motor Activity: Unremarkable(Pt itching. Pt reports being allergic to fish. Alerted nurse) Speech: Logical/coherent Level of Consciousness: Alert Mood: Pleasant Affect: Apprehensive Anxiety Level: None Thought Processes: Coherent, Relevant Judgement: Unimpaired Orientation: Person, Place, Time, Situation, Appropriate for developmental age Obsessive Compulsive Thoughts/Behaviors: None  Cognitive Functioning Concentration: Normal Memory: Recent Intact, Remote Intact Is patient IDD: No Insight: Good Impulse Control: Fair Appetite: Good Have you had any weight changes? : No Change Sleep: Decreased Total Hours of Sleep: 5 Vegetative Symptoms: None  ADLScreening [facility] Assessment Services) Patient's cognitive ability adequate to safely complete daily activities?: Yes Patient able to express need for assistance with ADLs?: Yes Independently performs ADLs?: Yes (appropriate for developmental age)  Prior Inpatient Therapy Prior Inpatient Therapy: Yes Prior Therapy Dates: 2019 February Prior Therapy Facilty/Provider(s): [place], [facility] Reason for Treatment: SI; HI  Prior Outpatient Therapy Prior Outpatient Therapy: Yes Prior Therapy Dates: Pt unsure.  Prior Therapy Facilty/Provider(s): Pt unsure.  Reason for Treatment: Schizoaffective Does patient have an ACCT team?: No Does patient have Intensive In-House Services?  :  No Does patient have [facility] services? : No Does patient have P4CC services?: No  ADL Screening (condition at time of admission) Patient's cognitive ability adequate to safely complete daily activities?: Yes Is the patient deaf or have difficulty hearing?:  No Does the patient have difficulty seeing, even when wearing glasses/contacts?: No Does the patient have difficulty concentrating, remembering, or making decisions?: No Patient able to express need for assistance with ADLs?: Yes Does the patient have difficulty dressing or bathing?: No Independently performs ADLs?: Yes (appropriate for developmental age) Does the patient have difficulty walking or climbing stairs?: No Weakness of Legs: None Weakness of Arms/Hands: None  Home Assistive Devices/Equipment Home Assistive Devices/Equipment: None  Therapy Consults (therapy consults require a physician order) PT Evaluation Needed: No OT Evalulation Needed: No SLP Evaluation Needed: No Abuse/Neglect Assessment (Assessment to be complete while patient is alone) Abuse/Neglect Assessment Can Be Completed: Yes Physical Abuse: Denies Verbal Abuse: Denies Sexual Abuse: Denies Exploitation of patient/patient's resources: Denies Self-Neglect: Denies Values / Beliefs Cultural Requests During Hospitalization: None Spiritual Requests During Hospitalization: None Consults Spiritual Care Consult Needed: No Social Work Consult Needed: No Merchant navy officer (For Healthcare) Does Patient Have a Medical Advance Directive?: No Would patient like information on creating a medical advance directive?: No - Patient declined          Disposition: Per Marty Heck, PA. Pt to be held overnight for observation due to conflicting information given by pt versus IVC paperwork.  Disposition Initial Assessment Completed for this Encounter: Yes  Chesley Noon, M.S., [facility], LCAS Triage Specialist [facility]  11/06/2017 8:55 PM

## 2017-11-06 NOTE — ED Provider Notes (Addendum)
[facility]-EMERGENCY DEPT Provider Note   CSN: [id number] Arrival date & time: 11/06/17  1726     History   Chief Complaint Chief Complaint  Patient presents with  . IVC'd  . Suicidal  . Homicidal    HPI Chelsea [name] is a 47 y.o. female.  The history is provided by the patient and the police. No language interpreter was used.    47 year old female with history of bipolar, schizophrenia, depression presenting with suicidal and homicidal ideation.  Per [employer] note, patient report he suicidal and homicidal with her family, threatening to kill herself and another family member.  Patient however states she has no suicidal ideation or homicidal ideation.  Patient mention she lives with her mom and dad.  Her mom does not look like it when she smoke in the house.  She mentioned a week ago she was smoking in the house and her mom wants her to go outside and she said "one day I am just going to kill myself" without having any intent.  She also denies having any homicidal ideation auditory or visual hallucination.  She mention going for a follow-up visit with her psychiatrist today and her family member voiced that patient has SI HI and therefore patient was sent here.  Patient denies feeling depressed.  She does admits to eating more but losing weight, and not having restful sleep.  She did report been placed on a new antidepressant medication a month ago.  She denies any intention to harm herself in the past.  Past Medical History:  Diagnosis Date  . Allergy   . Bipolar disorder (HCC)   . Depression   . Eczema   . Elevated cholesterol   . Hypertension   . Schizophrenia [facility])     Patient Active Problem List   Diagnosis Date Noted  . PCP NOTES >>>>>>>>>>>>>>>>>>>> 11/28/2016  . Eczema of both upper extremities 10/16/2016  . Essential hypertension 10/16/2016  . Smoker 01/12/2016  . Leukocytosis 10/04/2013  . Hyperkalemia 10/04/2013  . Serum ammonia increased  (HCC) 10/04/2013  . ALLERGIC RHINITIS 10/26/2008  . HYPERLIPIDEMIA 01/18/2008  . Schizoaffective disorder, bipolar type (HCC) 01/16/2008    Past Surgical History:  Procedure Laterality Date  . NO PAST SURGERIES       OB History    Gravida  0   Para  0   Term  0   Preterm  0   AB  0   Living  0     SAB  0   TAB  0   Ectopic  0   Multiple  0   Live Births  0            Home Medications    Prior to Admission medications   Medication Sig Start Date End Date Taking? Authorizing Provider  amLODipine (NORVASC) 5 MG tablet TAKE 1 TABLET BY MOUTH ONCE DAILY 09/20/17  Yes Paz, Nolon Rod, MD  ARIPiprazole (ABILIFY) 10 MG tablet Take 5 mg by mouth daily.  04/03/17  Yes [provider]  Ascorbic Acid (VITAMIN C PO) Take 1 tablet by mouth daily.   Yes [provider]  atorvastatin (LIPITOR) 20 MG tablet Take 1 tablet (20 mg total) by mouth at bedtime. 12/28/16  Yes Paz, Nolon Rod, MD  cariprazine [facility]) capsule Take 1.5 mg by mouth daily.   Yes [provider]  cloNIDine (CATAPRES) 0.1 MG tablet Take 1 tablet (0.1 mg total) by mouth 2 (two) times daily. 08/29/17  Yes Paz, Nolon Rod, MD  Multiple Vitamins-Minerals (MULTIVITAMIN ADULT) TABS Take 1 tablet by mouth daily.   Yes [provider]  trazodone (DESYREL) 300 MG tablet Take 300 mg by mouth at bedtime. 11/01/16  Yes [provider]  ferrous sulfate 325 (65 FE) MG tablet Take 1 tablet (325 mg total) by mouth 2 (two) times daily. On an empty stomach Patient not taking: Reported on 08/29/2017 12/28/16   Wanda Plump, MD    Family History Family History  Problem Relation Age of Onset  . Breast cancer Mother   . Hypertension Father   . Diabetes Paternal Grandmother   . Colon cancer Neg Hx     Social History Social History   Tobacco Use  . Smoking status: Current Every Day Smoker    Packs/day: 1.50    Types: Cigarettes  . Smokeless tobacco: Never Used  Substance Use Topics    . Alcohol use: Yes    Frequency: Never    Comment:  socially , rare   . Drug use: No     Allergies   Divalproex sodium er and Aspirin   Review of Systems Review of Systems  All other systems reviewed and are negative.    Physical Exam Updated Vital Signs BP (!) 152/105 (BP Location: Left Arm)   Temp 97.7 F (36.5 C) (Oral)   Resp 20   SpO2 94%   Physical Exam  Constitutional: She appears well-developed and well-nourished. No distress.  Patient is resting comfortably in bed, drinking coffee, in no acute discomfort  HENT:  Head: Atraumatic.  Eyes: Conjunctivae are normal.  Neck: Neck supple.  Cardiovascular: Normal rate and regular rhythm.  Pulmonary/Chest: Effort normal and breath sounds normal.  Abdominal: Soft.  Neurological: She is alert. GCS eye subscore is 4. GCS verbal subscore is 5. GCS motor subscore is 6.  Skin: No rash noted.  Psychiatric: She has a normal mood and affect. Her speech is normal and behavior is normal. Thought content is not paranoid. She expresses no homicidal and no suicidal ideation.  Nursing note and vitals reviewed.    ED Treatments / Results  Labs (all labs ordered are listed, but only abnormal results are displayed) Labs Reviewed  ACETAMINOPHEN LEVEL - Abnormal; Notable for the following components:      Result Value   Acetaminophen (Tylenol), Serum <10 (*)    All other components within normal limits  COMPREHENSIVE METABOLIC PANEL  ETHANOL  SALICYLATE LEVEL  CBC  RAPID URINE DRUG SCREEN, HOSP PERFORMED  I-STAT BETA HCG BLOOD, ED ([facility], [facility], [facility] ONLY)    EKG None  Radiology No results found.  Procedures Procedures (including critical care time)  Medications Ordered in ED Medications  amLODipine (NORVASC) tablet 5 mg (has no administration in time range)  ARIPiprazole (ABILIFY) tablet 5 mg (has no administration in time range)  cariprazine (VRAYLAR) capsule 1.5 mg (has no administration in time range)  cloNIDine  (CATAPRES) tablet 0.1 mg (has no administration in time range)  traZODone (DESYREL) tablet 300 mg (has no administration in time range)  nicotine polacrilex (NICORETTE) gum 2 mg (has no administration in time range)     Initial Impression / Assessment and Plan / ED Course  I have reviewed the triage vital signs and the nursing notes.  Pertinent labs & imaging results that were available during my care of the patient were reviewed by me and considered in my medical decision making (see chart for details).     BP Marland Kitchen)  152/105 (BP Location: Left Arm)   Temp 97.7 F (36.5 C) (Oral)   Resp 20   SpO2 94%    Final Clinical Impressions(s) / ED Diagnoses   Final diagnoses:  Suicidal ideation    ED Discharge Orders    None     6:35 PM Pt here with IVC paper due to having SI/HI.  Pt however denies having either. She is resting comfortably, calm and cooperative.  Will perform medical screening exam and will consult TTS for further psychiatric assessment.   7:17 PM Pt is medically cleared and is stable for further psychiatric assessment.    10:26 PM TTS agrees pt to be reevaluated in the AM.  First examination for IVC filed.      Fayrene Helper, PA-C 11/06/17 2226    Mancel Bale, MD 11/07/17 [phone]

## 2017-11-06 NOTE — ED Notes (Signed)
Provider at bedside, pt given dinner tray

## 2017-11-06 NOTE — ED Notes (Signed)
Pt noted scratching her entire body. Pt states she ate fish, and she's allergic. Nurse ask patient why did she eat the fish she stated" I wanted something warm on my stomach. Pt asked repeatedly if she wanted benadryl. Pt told nurse to stop asking her it about, it will go away and to get out her room. Dr Effie Shy made aware, will continue to monitor.

## 2017-11-06 NOTE — ED Notes (Signed)
Spoke with Greta Doom, PA about patient requesting NICOTINE GUM instead of a NICOTINE patch. Greta Doom, PA reports he is going to order the gum as requested.

## 2017-11-07 ENCOUNTER — Other Ambulatory Visit: Payer: Self-pay

## 2017-11-07 ENCOUNTER — Inpatient Hospital Stay (HOSPITAL_COMMUNITY)
Admission: AD | Admit: 2017-11-07 | Discharge: 2017-11-14 | DRG: 885 | Disposition: A | Discharge status: Home / Self Care | Payer: Medicare Other | Source: Intra-hospital | Attending: Psychiatry | Admitting: Psychiatry

## 2017-11-07 ENCOUNTER — Encounter (HOSPITAL_COMMUNITY): Payer: Self-pay | Admitting: Behavioral Health

## 2017-11-07 DIAGNOSIS — Z833 Family history of diabetes mellitus: Secondary | ICD-10-CM | POA: Diagnosis not present

## 2017-11-07 DIAGNOSIS — Z886 Allergy status to analgesic agent status: Secondary | ICD-10-CM

## 2017-11-07 DIAGNOSIS — I10 Essential (primary) hypertension: Secondary | ICD-10-CM | POA: Diagnosis present

## 2017-11-07 DIAGNOSIS — R45851 Suicidal ideations: Secondary | ICD-10-CM | POA: Diagnosis present

## 2017-11-07 DIAGNOSIS — F209 Schizophrenia, unspecified: Secondary | ICD-10-CM

## 2017-11-07 DIAGNOSIS — G4700 Insomnia, unspecified: Secondary | ICD-10-CM | POA: Diagnosis present

## 2017-11-07 DIAGNOSIS — R451 Restlessness and agitation: Secondary | ICD-10-CM | POA: Diagnosis present

## 2017-11-07 DIAGNOSIS — F419 Anxiety disorder, unspecified: Secondary | ICD-10-CM | POA: Diagnosis present

## 2017-11-07 DIAGNOSIS — F250 Schizoaffective disorder, bipolar type: Secondary | ICD-10-CM | POA: Diagnosis not present

## 2017-11-07 DIAGNOSIS — F17210 Nicotine dependence, cigarettes, uncomplicated: Secondary | ICD-10-CM | POA: Diagnosis present

## 2017-11-07 DIAGNOSIS — F329 Major depressive disorder, single episode, unspecified: Secondary | ICD-10-CM | POA: Insufficient documentation

## 2017-11-07 DIAGNOSIS — Z91013 Allergy to seafood: Secondary | ICD-10-CM | POA: Diagnosis not present

## 2017-11-07 DIAGNOSIS — Z803 Family history of malignant neoplasm of breast: Secondary | ICD-10-CM

## 2017-11-07 DIAGNOSIS — Z79899 Other long term (current) drug therapy: Secondary | ICD-10-CM | POA: Diagnosis not present

## 2017-11-07 DIAGNOSIS — Z8249 Family history of ischemic heart disease and other diseases of the circulatory system: Secondary | ICD-10-CM | POA: Diagnosis not present

## 2017-11-07 DIAGNOSIS — E785 Hyperlipidemia, unspecified: Secondary | ICD-10-CM | POA: Diagnosis present

## 2017-11-07 DIAGNOSIS — Z888 Allergy status to other drugs, medicaments and biological substances status: Secondary | ICD-10-CM | POA: Diagnosis not present

## 2017-11-07 LAB — RAPID URINE DRUG SCREEN, HOSP PERFORMED
Amphetamines: NOT DETECTED
BENZODIAZEPINES: NOT DETECTED
Barbiturates: NOT DETECTED
COCAINE: NOT DETECTED
OPIATES: NOT DETECTED
Tetrahydrocannabinol: NOT DETECTED

## 2017-11-07 MED ORDER — ALUM & MAG HYDROXIDE-SIMETH 200-200-20 MG/5ML PO SUSP: 30.0000 mL | ORAL | Status: DC | PRN

## 2017-11-07 MED ORDER — OLANZAPINE 5 MG PO TBDP: 5.0000 mg | ORAL_TABLET | Freq: Three times a day (TID) | ORAL | Status: DC | PRN

## 2017-11-07 MED ORDER — LORAZEPAM 2 MG/ML IJ SOLN
1.0000 mg | Freq: Four times a day (QID) | INTRAMUSCULAR | Status: DC | PRN
  Filled 2017-11-07: qty 1

## 2017-11-07 MED ORDER — NICOTINE POLACRILEX 2 MG MT GUM
2.0000 mg | CHEWING_GUM | OROMUCOSAL | Status: DC | PRN
  Administered 2017-11-07 – 2017-11-14 (×16): 2 mg via ORAL
  Filled 2017-11-07 (×7): qty 1

## 2017-11-07 MED ORDER — CARIPRAZINE HCL 1.5 MG PO CAPS
1.5000 mg | ORAL_CAPSULE | Freq: Every day | ORAL | Status: DC
  Administered 2017-11-08: 1.5 mg via ORAL
  Filled 2017-11-07 (×3): qty 1

## 2017-11-07 MED ORDER — TRAZODONE HCL 150 MG PO TABS
300.0000 mg | ORAL_TABLET | Freq: Every day | ORAL | Status: DC
  Administered 2017-11-07 – 2017-11-08 (×2): 300 mg via ORAL
  Filled 2017-11-07 (×4): qty 2

## 2017-11-07 MED ORDER — MAGNESIUM HYDROXIDE 400 MG/5ML PO SUSP: 30.0000 mL | Freq: Every day | ORAL | Status: DC | PRN

## 2017-11-07 MED ORDER — LORAZEPAM 1 MG PO TABS
1.0000 mg | ORAL_TABLET | Freq: Four times a day (QID) | ORAL | Status: DC | PRN
  Administered 2017-11-10 – 2017-11-13 (×4): 1 mg via ORAL
  Filled 2017-11-07 (×4): qty 1

## 2017-11-07 MED ORDER — TRAZODONE HCL 50 MG PO TABS: 50.0000 mg | ORAL_TABLET | Freq: Every evening | ORAL | Status: DC | PRN

## 2017-11-07 MED ORDER — ZIPRASIDONE MESYLATE 20 MG IM SOLR
20.0000 mg | Freq: Two times a day (BID) | INTRAMUSCULAR | Status: DC | PRN
  Administered 2017-11-11: 20 mg via INTRAMUSCULAR
  Filled 2017-11-07: qty 20

## 2017-11-07 MED ORDER — ARIPIPRAZOLE 5 MG PO TABS
5.0000 mg | ORAL_TABLET | Freq: Every day | ORAL | Status: DC
  Filled 2017-11-07 (×2): qty 1

## 2017-11-07 MED ORDER — NICOTINE POLACRILEX 2 MG MT GUM: 2.0000 mg | CHEWING_GUM | OROMUCOSAL | Status: DC | PRN

## 2017-11-07 MED ORDER — CLONIDINE HCL 0.1 MG PO TABS
0.1000 mg | ORAL_TABLET | Freq: Two times a day (BID) | ORAL | Status: DC
  Administered 2017-11-07 – 2017-11-14 (×13): 0.1 mg via ORAL
  Filled 2017-11-07 (×21): qty 1

## 2017-11-07 MED ORDER — ACETAMINOPHEN 325 MG PO TABS
650.0000 mg | ORAL_TABLET | Freq: Four times a day (QID) | ORAL | Status: DC | PRN
  Administered 2017-11-08 – 2017-11-14 (×3): 650 mg via ORAL
  Filled 2017-11-07 (×3): qty 2

## 2017-11-07 MED ORDER — AMLODIPINE BESYLATE 5 MG PO TABS
5.0000 mg | ORAL_TABLET | Freq: Every day | ORAL | Status: DC
  Administered 2017-11-08 – 2017-11-14 (×7): 5 mg via ORAL
  Filled 2017-11-07 (×11): qty 1

## 2017-11-07 NOTE — Progress Notes (Signed)
Dr. Effie Shy and Nurse, Zella Ball informed of pt disposition at 23:00 on 11/06/2017.

## 2017-11-07 NOTE — Progress Notes (Signed)
D: Pt denies SI/HI/AVH. Pt is confrontational and argumentative at times . Pt tried to insist she only took certain medications once today but Clinical research associate explained she got some medications at [facility] and some at [facility]. Pt was insisting she took certain medications at bed time, but pt already received them. Pt was encouraged to discuss with the doctor at what times she normally take her medications.  A: Pt was offered support and encouragement. Pt was given scheduled medications. Pt was encourage to attend groups. Q 15 minute checks were done for safety.  R: Pt is taking medication. Pt has no complaints.Pt receptive to treatment and safety maintained on unit.  Problem: Activity: Goal: Sleeping patterns will improve Outcome: Progressing   Problem: Safety: Goal: Periods of time without injury will increase Outcome: Progressing

## 2017-11-07 NOTE — Progress Notes (Signed)
Patient ID: Chelsea [name], female   DOB: [birth year], 47 y.o.   MRN: [id number]  PER STATE REGULATIONS 482.30  THIS CHART WAS REVIEWED FOR MEDICAL NECESSITY WITH RESPECT TO THE PATIENT'S ADMISSION/DURATION OF STAY.  NEXT REVIEW DATE:11/11/17 Loura Halt, RN, BSN CASE MANAGER

## 2017-11-07 NOTE — Progress Notes (Signed)
Did not attend group 

## 2017-11-07 NOTE — Progress Notes (Signed)
Pt admitted to the adult unit from [facility] under IVC from her parents. Pt expressed that every time she have a disagreement with her parents, they have her committed to the hospital. Pt reported that she was hospitalized in February for the same thing. Pt denies SI/HI. Pt denies AVH. Pt denies feeling paranoid. Pt verbalized that she is compliant with taking all of her medications. Pt did not appear to be responding to internal stimuli but during the admission process was noted to be easily agitated and irritable. Pt oriented to the unit and provided with a lunch tray.

## 2017-11-07 NOTE — BH Assessment (Signed)
[facility] Assessment Progress Note  Per Juanetta Beets, DO, this pt requires psychiatric hospitalization.  Malva Limes, RN, [facility] has assigned pt to [facility] Rm [id number]; [facility] will be ready to receive pt between 12:30 and 13:00.  Pt presents under IVC initiated by pt's brother, and upheld by EDP Mancel Bale, MD, and IVC documents have been faxed to [facility].  Pt's nurse, Kendal Hymen, has been notified, and agrees to call report to [phone].  Pt is to be transported via [employer].   Doylene Canning, [place] [facility] Coordinator [phone]

## 2017-11-07 NOTE — Progress Notes (Signed)
Per Dr. Norman and Tanika Lewis, NP patient meets in patient criteria. TTS to seek placement. 

## 2017-11-07 NOTE — ED Notes (Signed)
Pt refused to give urine.

## 2017-11-07 NOTE — Tx Team (Signed)
Initial Treatment Plan 11/07/2017 1:57 PM Chelsea [name] [id number]:[id number]    PATIENT STRESSORS: Marital or family conflict   PATIENT STRENGTHS: Ability for insight Financial means   PATIENT IDENTIFIED PROBLEMS: "My parents always have me committed whenever we have a disagreement".  "I don't have any problems to addresss".                   DISCHARGE CRITERIA:  Ability to meet basic life and health needs Adequate post-discharge living arrangements Improved stabilization in mood, thinking, and/or behavior  PRELIMINARY DISCHARGE PLAN: Attend aftercare/continuing care group Attend PHP/IOP  PATIENT/FAMILY INVOLVEMENT: This treatment plan has been presented to and reviewed with the patient, Chelsea [name], and/or family member.  The patient and family have been given the opportunity to ask questions and make suggestions.  Layla Barter, RN 11/07/2017, 1:57 PM

## 2017-11-07 NOTE — ED Notes (Signed)
Pt discharged safely with [employer].  All belongings were sent with patient.  Pt was in no distress. 
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
## 2017-11-08 DIAGNOSIS — F250 Schizoaffective disorder, bipolar type: Principal | ICD-10-CM

## 2017-11-08 MED ORDER — CARIPRAZINE HCL 3 MG PO CAPS
3.0000 mg | ORAL_CAPSULE | Freq: Every day | ORAL | Status: DC
  Administered 2017-11-09 – 2017-11-14 (×6): 3 mg via ORAL
  Filled 2017-11-08 (×8): qty 1

## 2017-11-08 NOTE — Progress Notes (Addendum)
D: Pt denies SI/HI/AVH. Pt presents on the unit at times if she is above staff. Pt acts as if she is entitled by little remarks and appears that she feels she should get different things than her peers. Pt has been seen on the unit only when she is needing something, pt keeps to herself most of the evening.   A: Pt was offered support and encouragement. Pt was given scheduled medications. Pt was encourage to attend groups. Q 15 minute checks were done for safety.   R:Pt is taking medication.  safety maintained on unit.  Problem: Safety: Goal: Periods of time without injury will increase Outcome: Progressing   Problem: Education: Goal: Will be free of psychotic symptoms Outcome: Progressing   Problem: Education: Goal: Knowledge of the prescribed therapeutic regimen will improve Outcome: Progressing

## 2017-11-08 NOTE — [facility] Group Notes (Signed)
[facility] LCSW Group Therapy Note  Date/Time: 11/08/17, 1345  Type of Therapy/Topic:  Group Therapy:  Balance in Life  Participation Level:  none  Description of Group:    This group will address the concept of balance and how it feels and looks when one is unbalanced. Patients will be encouraged to process areas in their lives that are out of balance, and identify reasons for remaining unbalanced. Facilitators will guide patients utilizing problem- solving interventions to address and correct the stressor making their life unbalanced. Understanding and applying boundaries will be explored and addressed for obtaining  and maintaining a balanced life. Patients will be encouraged to explore ways to assertively make their unbalanced needs known to significant others in their lives, using other group members and facilitator for support and feedback.  Therapeutic Goals: 1. Patient will identify two or more emotions or situations they have that consume much of in their lives. 2. Patient will identify signs/triggers that life has become out of balance:  3. Patient will identify two ways to set boundaries in order to achieve balance in their lives:  4. Patient will demonstrate ability to communicate their needs through discussion and/or role plays  Summary of Patient Progress:Pt came to group initially but left shortly after group started without participating.          Therapeutic Modalities:   Cognitive Behavioral Therapy Solution-Focused Therapy Assertiveness Training  Daleen Squibb, [place]

## 2017-11-08 NOTE — Progress Notes (Signed)
Did not attend group 

## 2017-11-08 NOTE — Progress Notes (Signed)
Recreation Therapy Notes  INPATIENT RECREATION THERAPY ASSESSMENT  Patient Details Name: Keileigh Vahey MRN: [id number] DOB: 1970-12-29 Today's Date: 11/08/2017       Information Obtained From: Patient  Able to Participate in Assessment/Interview: Yes  Patient Presentation: Alert  Reason for Admission (Per Patient): Other (Comments)(Pt stated a misunderstanding with her parentes)  Patient Stressors: Other (Comment)(Not sleeping well)  Coping Skills:   Journal, TV, Music, Exercise, Talk, Prayer, Avoidance, Read  Leisure Interests (2+):  Exercise - Walking, Social - Family, Individual - Journaling, Individual - Reading, Individual - Other (Comment)(Go online)  Frequency of Recreation/Participation: Other (Comment)(Daily)  Awareness of Community Resources:  Yes  Community Resources:  Coffee Shop, Other (Comment)(Shopping mall; Book store)  Current Use: Yes  If no, Barriers?:    Expressed Interest in [employer] Information: No  [employer] of Residence:  Engineer, technical sales  Patient Main Form of Transportation: Set designer  Patient Strengths:  "I don't know right"  Patient Identified Areas of Improvement:  Not speaking loud  Patient Goal for Hospitalization:  "Try to get back on normal sleep pattern"  Current SI (including self-harm):  No  Current HI:  No  Current AVH: No  Staff Intervention Plan: Group Attendance, Collaborate with Interdisciplinary Treatment Team  Consent to Intern Participation: N/A    Caroll Rancher, LRT/CTRS  Caroll Rancher A 11/08/2017, 2:31 PM

## 2017-11-08 NOTE — [facility] Counselor (Signed)
Adult Comprehensive Assessment  Patient ID: Chelsea [name], female   DOB: [birth year], 47 y.o.   MRN: [id number]  Information Source: Information source: Patient  Current Stressors:  Patient states their primary concerns and needs for treatment are:: Chelsea [name] stated that her sleep pattern has been off; while watching tv, her mom worried she would take her life because she was joking about it; Patient states their goals for this hospitilization and ongoing recovery are:: Chelsea [name] stated that she will work on her sleep pattern because her mom would appreciate her sleeping at night. Family Relationships: "My sleep and I smoke, they do not want me awake at night and they do not want me to smoke" Chelsea [name] is referring to her parents)  Living/Environment/Situation:  Living Arrangements: Parent Living conditions (as described by patient or guardian): "Everything is cool" Who else lives in the home?: "My older brother, my dad and my mom" How long has patient lived in current situation?: "All my life" What is atmosphere in current home: Loving, Comfortable  Family History:  Marital status: Single Are you sexually active?: No What is your sexual orientation?: heterosexual Has your sexual activity been affected by drugs, alcohol, medication, or emotional stress?: None reported Does patient have children?: No  Childhood History:  By whom was/is the patient raised?: Both parents Patient's description of current relationship with people who raised him/her: "It's normal" How were you disciplined when you got in trouble as a child/adolescent?: "That  Does patient have siblings?: (1 brother) Did patient suffer any verbal/emotional/physical/sexual abuse as a child?: (Refused to answer) Did patient suffer from severe childhood neglect?: No(Refused) Patient description of severe childhood neglect: Refused Has patient ever been sexually abused/assaulted/raped as an adolescent or adult?: (Refused) Was the  patient ever a victim of a crime or a disaster?: No  Education:  Highest grade of school patient has completed: Refused to answer Currently a student?: (Refused to answer) Learning disability?: (Refused to answer)  Employment/Work Situation:   Employment situation: On disability Why is patient on disability: Orlena refused to answer How long has patient been on disability: "About a year and a half" What is the longest time patient has a held a job?: Refused to answer Where was the patient employed at that time?: Refused to answer Are There Guns or Other Weapons in Your Home?: (Refused to answer) Are These Weapons Safely Secured?: (Refused to answer)  Financial Resources:   Financial resources: [employer] SSDI Does patient have a Lawyer or guardian?: (Chelsea [name] refused to answer)  Alcohol/Substance Abuse:   What has been your use of drugs/alcohol within the last 12 months?: Tobacco smoking/daily  If attempted suicide, did drugs/alcohol play a role in this?: No Alcohol/Substance Abuse Treatment Hx: Denies past history Has alcohol/substance abuse ever caused legal problems?: No  Social Support System:   Conservation officer, nature Support System: Fair Describe Community Support System: Chelsea [name] stated that she has a Therapist, sports and her family members  Type of faith/religion: Chelsea [name] refused to answer How does patient's faith help to cope with current illness?: Chelsea [name] refused to answer  Leisure/Recreation:   Leisure and Hobbies: Refused to answer  Strengths/Needs:   What is the patient's perception of their strengths?: Chelsea [name] stated that she would not answer any more questions Patient states they can use these personal strengths during their treatment to contribute to their recovery: Chelsea [name] reported that she would follow up with her psychiatrist as she always has.  Patient states these barriers may affect/interfere with their treatment: Chelsea [name] stated that she  did not need any  assistance and that it was a misunderstanding between her and her mother that was the only reason she was here Patient states these barriers may affect their return to the community: Chelsea [name] stated that she did not need to answer these questions and was becoming increasingly agitated  Other important information patient would like considered in planning for their treatment: Refused to answer  Discharge Plan:   Currently receiving community mental health services: Yes (From Whom)(Chelsea [name] stated she would not answer any more questions) Patient states concerns and preferences for aftercare planning are: "I am not going to have a follow up appointment..." Patient states they will know when they are safe and ready for discharge when: "I have answered all of these questions...it was a misunderstanding between my mother and me" Does patient have access to transportation?: (Refused to answer) Does patient have financial barriers related to discharge medications?: (Refused to answer) Will patient be returning to same living situation after discharge?: Yes  Summary/Recommendations:   Summary and Recommendations (to be completed by the evaluator): Chelsea [name] is a 47 yo Hispanic female living with her parents in [place]. She is diagnosed with Schizoaffective disorder, Bipolar type. She presents involuntarily due to increased depression and suicidal statements. While here, Chelsea [name] may benefit from crisies stabilization, medication management, a therapeutic milieu and referral to services.  Chelsea [name] MSW Intern. 11/08/2017

## 2017-11-08 NOTE — BH Assessment (Signed)
Pt up pacing the unit, pt did not sleep much last night with current HS medication . Pt refused anything to help her relax" when i'm up I don't go back to sleep" pt only slept 2.75 hrs

## 2017-11-08 NOTE — BHH Suicide Risk Assessment (Signed)
[facility] Admission Suicide Risk Assessment   Nursing information obtained from:  Patient Demographic factors:  Unemployed, Low socioeconomic status Current Mental Status:  Suicidal ideation indicated by others Loss Factors:  NA Historical Factors:  Impulsivity Risk Reduction Factors:  Living with another person, especially a relative  Total Time spent with patient: 1 hour Principal Problem: Schizoaffective disorder, bipolar type (HCC) Diagnosis:   Patient Active Problem List   Diagnosis Date Noted  . MDD (major depressive disorder) [F32.9] 11/07/2017  . Schizophrenia (HCC) [F20.9]   . PCP NOTES >>>>>>>>>>>>>>>>>>>> [Z09] 11/28/2016  . Eczema of both upper extremities [L30.9] 10/16/2016  . Essential hypertension [I10] 10/16/2016  . Smoker [F17.200] 01/12/2016  . Leukocytosis [D72.829] 10/04/2013  . Hyperkalemia [E87.5] 10/04/2013  . Serum ammonia increased (HCC) [E72.20] 10/04/2013  . ALLERGIC RHINITIS [J30.9] 10/26/2008  . HYPERLIPIDEMIA [E78.5] 01/18/2008  . Schizoaffective disorder, bipolar type (HCC) [F25.0] 01/16/2008   Subjective Data: see H&P  Continued Clinical Symptoms:  Alcohol Use Disorder Identification Test Final Score (AUDIT): 0 The "Alcohol Use Disorders Identification Test", Guidelines for Use in Primary Care, Second Edition.  World Science writer [facility]). Score between 0-7:  no or low risk or alcohol related problems. Score between 8-15:  moderate risk of alcohol related problems. Score between 16-19:  high risk of alcohol related problems. Score 20 or above:  warrants further diagnostic evaluation for alcohol dependence and treatment.   Psychiatric Specialty Exam: Physical Exam  Nursing note and vitals reviewed.     Blood pressure 140/88, pulse 83, temperature 98.4 F (36.9 C), temperature source Oral, resp. rate 18, height 5\' 6"  (1.676 m), weight 77.1 kg, last menstrual period 11/06/2017.Body mass index is 27.44 kg/m.    COGNITIVE FEATURES THAT  CONTRIBUTE TO RISK:  None    SUICIDE RISK:   Minimal: No identifiable suicidal ideation.  Patients presenting with no risk factors but with morbid ruminations; may be classified as minimal risk based on the severity of the depressive symptoms  PLAN OF CARE: see H&P  I certify that inpatient services furnished can reasonably be expected to improve the patient's condition.   Micheal Likens, MD 11/08/2017, 1:23 PM

## 2017-11-08 NOTE — Progress Notes (Signed)
Patient denies SI, HI and AVH this shift.  Patient has been noted to talk with unseen other.  Patient has been compliant with medications and attended groups this shift.  Patient has had no incidents of behavioral dyscontrol.   Assess patient for safety, offer medications as prescribed, engage patient in 1:1 staff talks.   Patient able to contract for safety.  Continue to monitor as planned.

## 2017-11-08 NOTE — BHH Counselor (Addendum)
CSW met with patient for Psychosocial Assessement. Initially, Chelsea [name]'s speech and affect were appropriate then became increasingly agitated, anxious and suspicious during the interview with this Probation officer. This was evidenced by Theora's continuous physical distancing herself from this writer and asking questions about the questions.   Lawana Pai, MSW Intern 12:00 pm 11/08/17

## 2017-11-08 NOTE — BHH Suicide Risk Assessment (Addendum)
B[facility] INPATIENT:  Family/Significant Other Suicide Prevention Education  Suicide Prevention Education:  Education Completed; Chelsea [name] interpreted by her brother Irving Shows [phone], Irving Shows [phone] has been identified by the patient as the family member/significant other with whom the patient will be residing, and identified as the person(s) who will aid the patient in the event of a mental health crisis (suicidal ideations/suicide attempt).  With written consent from the patient, the family member/significant other has been provided the following suicide prevention education, prior to the and/or following the discharge of the patient.  The suicide prevention education provided includes the following:  Suicide risk factors  Suicide prevention and interventions  National Suicide Hotline telephone number  [facility] assessment telephone number  [facility] Emergency Assistance 911  [facility] and/or Residential Mobile Crisis Unit telephone number  Request made of family/significant other to:  Remove weapons (e.g., guns, rifles, knives), all items previously/currently identified as safety concern. The family stated there are no guns in the home.  Remove drugs/medications (over-the-counter, prescriptions, illicit drugs), all items previously/currently identified as a safety concern.  The family member/significant other verbalizes understanding of the suicide prevention education information provided.  The family member/significant other agrees to remove the items of safety concern listed above.  Cherie Bohaboy 11/08/2017, 5:24 PM

## 2017-11-08 NOTE — BHH Counselor (Signed)
CSW made collateral contact with Orson Gear, Reyanne's mother, [phone]. Her brother Irving Shows interpreted. The following information was provided:  The family had been living in [place] and moved to [place], [place], approximately 5 months ago. Anette began having mental health problems when she was about age 47 yo. Her health is good when she takes her medications and they work well. For example, she did very well on an injection, 1/month, of "Megaril" [he was uncertain]. After approximately 12 years, the doctors stated it was negatively affecting her health. Since then, the medications have changed many times. Her brother reported that her health has declined. There have been many hospitalizations over the past several years. She was hospitalized in July 2016 at [facility], June 2018 at [employer] [facility], then in January 2019 at a hospital in downtown [place]. Zanayah has been seeing Andreas Newport, MD in [place] and is responding well to this psychiatrist. In this current episode, Irving Shows described symptomology: she was not sleeping for several days, she was having audio and visual hallucinations as evidenced by "talking to people that were not there... seeing other people that were not seen by others... and she was saying that she wanted to kill herself and the other people she was talking to...". That was when she was involuntarily committed, [place] reported.  Marian Sorrow, MSW Intern 11/08/17 5:44 PM

## 2017-11-08 NOTE — H&P (Signed)
Psychiatric Admission Assessment Adult  Patient Identification: Chelsea [name] MRN:  [id number] Date of Evaluation:  11/08/2017 Chief Complaint:  SCHIZOAFFECTIVE DISORDER; BIPOLAR TYPE Principal Diagnosis: Schizoaffective disorder, bipolar type ([place]) Diagnosis:   Patient Active Problem List   Diagnosis Date Noted  . MDD (major depressive disorder) [F32.9] 11/07/2017  . Schizophrenia ([place]) [F20.9]   . PCP NOTES >>>>>>>>>>>>>>>>>>>> [Z09] 11/28/2016  . Eczema of both upper extremities [L30.9] 10/16/2016  . Essential hypertension [I10] 10/16/2016  . Smoker [F17.200] 01/12/2016  . Leukocytosis [D72.829] 10/04/2013  . Hyperkalemia [E87.5] 10/04/2013  . Serum ammonia increased ([place]) [E72.20] 10/04/2013  . ALLERGIC RHINITIS [J30.9] 10/26/2008  . HYPERLIPIDEMIA [E78.5] 01/18/2008  . Schizoaffective disorder, bipolar type ([place]) [F25.0] 01/16/2008   History of Present Illness:   Chelsea [name] is a 47 y/o F with history of schizophrenia (elsewhere schizoaffective disorder bipolar type) who was admitted from [place] after being brought in by [facility] on IVC initiated by her family with concern for worsening symptoms of depression, SI, HI, and decreased need for sleep. Pt was medically cleared and then transferred to [facility] for additional treatment and stabilization.  Upon initial interview, pt shares, "I was having a little bit of issues at home because I smoke in the house and my sleep pattern has been off." Pt is pleasant, calm, and cooperative with the interview. She feels that the concerns expressed in the IVC filed by her family are exaggerated. She reports that she did make a statement about SI but it was done in a joking manner and she has no intent of harming herself. She has no history of suicide attempt. She denies HI/AH/VH. She reports that her mood has been "a little hyper." She is sleeping 4-5 hours per night and then she will get up and do chores and other activities which is disturbing her  family. She denies other symptoms of depression. She denies symptoms of mania, OCD, and PTSD. She denies all illicit substance use including alcohol with exception of tobacco 1/2 ppd.   Discussed with patient about treatment options. She currently is taking abilify, vraylar, trazodone 340m qhs, and clonidine. She does not know her other doses, but she knows that she has been on abilify for about 1 year, and her mother would like her to change to a different medication. Pt reports previous trials of zyprexa (caused weight gain) and Invega sustenna (caused intrusive negative thoughts). We discussed about option of choosing a medication with long-acting injectable form, and pt declines due to her previous experience with [facility] We discussed about discontinuing abilify and increasing dose of vraylar while continuing her other medications and pt was in agreement. She is in agreement to allow the treatment team to gather additional collateral information from her family. Pt was in agreement with the above treatment plan, and she had no further questions, comments, or concerns.   Associated Signs/Symptoms: Depression Symptoms:  insomnia, (Hypo) Manic Symptoms:  Distractibility, Anxiety Symptoms:  NA Psychotic Symptoms:  NA PTSD Symptoms: NA Total Time spent with patient: 1 hour  Past Psychiatric History:  -dx's of schizophrenia and schizoaffective disorder bipolar type - [facility] - pt has new outpatient provider whom she has seen for 2 visits, but she does not know the name aside from [place] - pt denies hx of suicide attempt  Is the patient at risk to self? Yes.    Has the patient been a risk to self  in the past 6 months? Yes.    Has the patient been a risk to self within the distant past? Yes.    Is the patient a risk to others? Yes.    Has the patient been a risk to others in the past 6  months? Yes.    Has the patient been a risk to others within the distant past? Yes.     Prior Inpatient Therapy:   Prior Outpatient Therapy:    Alcohol Screening: 1. How often do you have a drink containing alcohol?: Never 2. How many drinks containing alcohol do you have on a typical day when you are drinking?: 1 or 2 3. How often do you have six or more drinks on one occasion?: Never AUDIT-C Score: 0 9. Have you or someone else been injured as a result of your drinking?: No 10. Has a relative or friend or a doctor or another health worker been concerned about your drinking or suggested you cut down?: No Alcohol Use Disorder Identification Test Final Score (AUDIT): 0 Intervention/Follow-up: AUDIT Score <7 follow-up not indicated Substance Abuse History in the last 12 months:  No. Consequences of Substance Abuse: NA Previous Psychotropic Medications: Yes  Psychological Evaluations: Yes  Past Medical History:  Past Medical History:  Diagnosis Date  . Allergy   . Bipolar disorder ([place])   . Depression   . Eczema   . Elevated cholesterol   . Hypertension   . Schizophrenia [facility])     Past Surgical History:  Procedure Laterality Date  . NO PAST SURGERIES     Family History:  Family History  Problem Relation Age of Onset  . Breast cancer Mother   . Hypertension Father   . Diabetes Paternal Grandmother   . Colon cancer Neg Hx    Family Psychiatric  History: denies family psychiatric history. Tobacco Screening: Have you used any form of tobacco in the last 30 days? (Cigarettes, Smokeless Tobacco, Cigars, and/or Pipes): Yes Tobacco use, Select all that apply: 4 or less cigarettes per day Are you interested in Tobacco Cessation Medications?: Yes, will notify MD for an order Counseled patient on smoking cessation including recognizing danger situations, developing coping skills and basic information about quitting provided: Yes Social History: Pt was born in [place], and she has  lived in the [place] area for several years. A few months ago she moved in with her mother and father after living in the [place] area. She completed some college. She does not work and she collects disability. She is divorced. She has no children. She denies legal and trauma history. Social History   Substance and Sexual Activity  Alcohol Use Yes  . Frequency: Never   Comment:  socially , rare      Social History   Substance and Sexual Activity  Drug Use No    Additional Social History: Marital status: Single Are you sexually active?: No What is your sexual orientation?: heterosexual Has your sexual activity been affected by drugs, alcohol, medication, or emotional stress?: None reported Does patient have children?: No                         Allergies:   Allergies  Allergen Reactions  . Divalproex Sodium Er Other (See Comments)    Delirium with hyperammonemia  . Aspirin     REACTION: swelling  . Fish Allergy    Lab Results:  Results for orders placed or performed during the hospital encounter  of 11/06/17 (from the past 48 hour(s))  Comprehensive metabolic panel     Status: None   Collection Time: 11/06/17  5:35 PM  Result Value Ref Range   Sodium 138 135 - 145 mmol/L   Potassium 4.0 3.5 - 5.1 mmol/L   Chloride 106 98 - 111 mmol/L   CO2 25 22 - 32 mmol/L   Glucose, Bld 80 70 - 99 mg/dL   BUN 11 6 - 20 mg/dL   Creatinine, Ser 0.67 0.44 - 1.00 mg/dL   Calcium 9.0 8.9 - 10.3 mg/dL   Total Protein 7.3 6.5 - 8.1 g/dL   Albumin 3.9 3.5 - 5.0 g/dL   AST 26 15 - 41 U/L   ALT 35 0 - 44 U/L   Alkaline Phosphatase 72 38 - 126 U/L   Total Bilirubin 0.6 0.3 - 1.2 mg/dL   GFR calc non Af Amer >60 >60 mL/min   GFR calc Af Amer >60 >60 mL/min    Comment: (NOTE) The eGFR has been calculated using the CKD EPI equation. This calculation has not been validated in all clinical situations. eGFR's persistently <60 mL/min signify possible Chronic Kidney Disease.     Anion gap 7 5 - 15    Comment: Performed at [facility], [place] [address], [place], [place] [address]  Ethanol     Status: None   Collection Time: 11/06/17  5:35 PM  Result Value Ref Range   Alcohol, Ethyl (B) <10 <10 mg/dL    Comment: (NOTE) Lowest detectable limit for serum alcohol is 10 mg/dL. For medical purposes only. Performed at [facility], [place] [address] [place], [place] [address]   Salicylate level     Status: None   Collection Time: 11/06/17  5:35 PM  Result Value Ref Range   Salicylate Lvl <8.3 2.8 - 30.0 mg/dL    Comment: Performed at [facility], [place] [address], [place], [place] [address]  Acetaminophen level     Status: Abnormal   Collection Time: 11/06/17  5:35 PM  Result Value Ref Range   Acetaminophen (Tylenol), Serum <10 (L) 10 - 30 ug/mL    Comment: (NOTE) Therapeutic concentrations vary significantly. A range of 10-30 ug/mL  may be an effective concentration for many patients. However, some  are best treated at concentrations outside of this range. Acetaminophen concentrations >150 ug/mL at 4 hours after ingestion  and >50 ug/mL at 12 hours after ingestion are often associated with  toxic reactions. Performed at [facility], [place] [address], [place], [place] [address]   cbc     Status: None   Collection Time: 11/06/17  5:35 PM  Result Value Ref Range   WBC 10.0 4.0 - 10.5 K/uL   RBC 4.61 3.87 - 5.11 MIL/uL   Hemoglobin 14.2 12.0 - 15.0 g/dL   HCT 43.7 36.0 - 46.0 %   MCV 94.8 80.0 - 100.0 fL   MCH 30.8 26.0 - 34.0 pg   MCHC 32.5 30.0 - 36.0 g/dL   RDW 13.2 11.5 - 15.5 %   Platelets 342 150 - 400 K/uL   nRBC 0.0 0.0 - 0.2 %    Comment: Performed at [facility], [place] [address], [place], [place] [address]  I-Stat beta hCG blood, ED     Status: None   Collection Time: 11/06/17  6:20 PM  Result Value Ref Range   I-stat hCG, quantitative <5.0 <5 mIU/mL    Comment 3  Comment:   GEST. AGE      CONC.  (mIU/mL)   <=1 WEEK        5 - 50     2 WEEKS       50 - 500     3 WEEKS       100 - 10,000     4 WEEKS     1,000 - 30,000        FEMALE AND NON-PREGNANT FEMALE:     LESS THAN 5 mIU/mL   Rapid urine drug screen (hospital performed)     Status: None   Collection Time: 11/07/17  9:09 AM  Result Value Ref Range   Opiates NONE DETECTED NONE DETECTED   Cocaine NONE DETECTED NONE DETECTED   Benzodiazepines NONE DETECTED NONE DETECTED   Amphetamines NONE DETECTED NONE DETECTED   Tetrahydrocannabinol NONE DETECTED NONE DETECTED   Barbiturates NONE DETECTED NONE DETECTED    Comment: (NOTE) DRUG SCREEN FOR MEDICAL PURPOSES ONLY.  IF CONFIRMATION IS NEEDED FOR ANY PURPOSE, NOTIFY LAB WITHIN 5 DAYS. LOWEST DETECTABLE LIMITS FOR URINE DRUG SCREEN Drug Class                     Cutoff (ng/mL) Amphetamine and metabolites    1000 Barbiturate and metabolites    200 Benzodiazepine                 342 Tricyclics and metabolites     300 Opiates and metabolites        300 Cocaine and metabolites        300 THC                            50 Performed at [facility], [place] [address], [place], [place] [address]     Blood Alcohol level:  Lab Results  Component Value Date   ETH <10 15/72/6203    Metabolic Disorder Labs:  Lab Results  Component Value Date   HGBA1C 5.6 12/14/2016   No results found for: PROLACTIN Lab Results  Component Value Date   CHOL 173 12/14/2016   TRIG 247.0 (H) 12/14/2016   HDL 36.70 (L) 12/14/2016   CHOLHDL 5 12/14/2016   VLDL 49.4 (H) 12/14/2016   LDLCALC 65 04/15/2009   LDLCALC 134 (H) 01/22/2009    Current Medications: Current Facility-Administered Medications  Medication Dose Route Frequency Provider Last Rate Last Dose  . acetaminophen (TYLENOL) tablet 650 mg  650 mg Oral Q6H PRN Derrill Center, NP   650 mg at 11/08/17 0138  . alum & mag hydroxide-simeth (MAALOX/MYLANTA)  200-200-20 MG/5ML suspension 30 mL  30 mL Oral Q4H PRN Derrill Center, NP      . amLODipine (NORVASC) tablet 5 mg  5 mg Oral Daily Derrill Center, NP   5 mg at 11/08/17 0738  . [START ON 11/09/2017] cariprazine (VRAYLAR) capsule 3 mg  3 mg Oral Daily Camron Monday T, MD      . cloNIDine (CATAPRES) tablet 0.1 mg  0.1 mg Oral BID Derrill Center, NP   0.1 mg at 11/08/17 0736  . LORazepam (ATIVAN) tablet 1 mg  1 mg Oral Q6H PRN Pennelope Bracken, MD       Or  . LORazepam (ATIVAN) injection 1 mg  1 mg Intramuscular Q6H PRN Pennelope Bracken, MD      . magnesium hydroxide (MILK OF MAGNESIA) suspension 30 mL  30 mL Oral Daily PRN Derrill Center, NP      . nicotine polacrilex (NICORETTE) gum 2 mg  2 mg Oral PRN Derrill Center, NP   2 mg at 11/08/17 0738  . OLANZapine zydis (ZYPREXA) disintegrating tablet 5 mg  5 mg Oral Q8H PRN Pennelope Bracken, MD       Or  . ziprasidone (GEODON) injection 20 mg  20 mg Intramuscular Q12H PRN Pennelope Bracken, MD      . traZODone (DESYREL) tablet 300 mg  300 mg Oral QHS Derrill Center, NP   300 mg at 11/07/17 2111   PTA Medications: Medications Prior to Admission  Medication Sig Dispense Refill Last Dose  . amLODipine (NORVASC) 5 MG tablet TAKE 1 TABLET BY MOUTH ONCE DAILY 30 tablet 6 11/05/2017 at Unknown time  . ARIPiprazole (ABILIFY) 10 MG tablet Take 5 mg by mouth daily.   0 11/05/2017 at Unknown time  . Ascorbic Acid (VITAMIN C PO) Take 1 tablet by mouth daily.   11/06/2017 at Unknown time  . atorvastatin (LIPITOR) 20 MG tablet Take 1 tablet (20 mg total) by mouth at bedtime. 30 tablet 6 11/05/2017 at Unknown time  . cariprazine (VRAYLAR) capsule Take 1.5 mg by mouth daily.   11/06/2017 at Unknown time  . cloNIDine (CATAPRES) 0.1 MG tablet Take 1 tablet (0.1 mg total) by mouth 2 (two) times daily. 60 tablet 6 11/06/2017 at Unknown time  . ferrous sulfate 325 (65 FE) MG tablet Take 1 tablet (325 mg total) by mouth 2  (two) times daily. On an empty stomach (Patient not taking: Reported on 08/29/2017) 60 tablet 6 Completed Course at Unknown time  . Multiple Vitamins-Minerals (MULTIVITAMIN ADULT) TABS Take 1 tablet by mouth daily.   11/06/2017 at Unknown time  . trazodone (DESYREL) 300 MG tablet Take 300 mg by mouth at bedtime.   11/05/2017 at Unknown time    Musculoskeletal: Strength & Muscle Tone: within normal limits Gait & Station: normal Patient leans: N/A  Psychiatric Specialty Exam: Physical Exam  Nursing note and vitals reviewed.   Review of Systems  Constitutional: Negative for chills and fever.  Respiratory: Negative for cough and shortness of breath.   Cardiovascular: Negative for chest pain.  Gastrointestinal: Negative for abdominal pain, heartburn, nausea and vomiting.  Psychiatric/Behavioral: Negative for depression, hallucinations and suicidal ideas. The patient has insomnia. The patient is not nervous/anxious.     Blood pressure 140/88, pulse 83, temperature 98.4 F (36.9 C), temperature source Oral, resp. rate 18, height '5\' 6"'  (1.676 m), weight 77.1 kg, last menstrual period 11/06/2017.Body mass index is 27.44 kg/m.  General Appearance: Casual and Fairly Groomed  Eye Contact:  Good  Speech:  Clear and Coherent and Normal Rate  Volume:  Normal  Mood:  Euthymic  Affect:  Flat  Thought Process:  Coherent and Goal Directed  Orientation:  Full (Time, Place, and Person)  Thought Content:  Logical  Suicidal Thoughts:  No  Homicidal Thoughts:  No  Memory:  Immediate;   Fair Recent;   Fair Remote;   Fair  Judgement:  Fair  Insight:  Lacking  Psychomotor Activity:  Normal  Concentration:  Concentration: Fair  Recall:  [employer] of Knowledge:  Fair  Language:  Fair  Akathisia:  No  Handed:    AIMS (if indicated):     Assets:  Desire for Improvement Housing Resilience Social Support  ADL's:  Intact  Cognition:  WNL  Sleep:  Number of  Hours: 4    Treatment Plan  Summary: Daily contact with patient to assess and evaluate symptoms and progress in treatment and Medication management  Observation Level/Precautions:  15 minute checks  Laboratory:  CBC Chemistry Profile HbAIC UDS UA  Psychotherapy:  Encourage participation in groups and therapeutic milieu   Medications:  DC abilify. Change vraylar 1.41m po qDay to vraylary 3106mpo qDay. Continue trazodone 30070mo qhs. Continue Clonidine 0.1mg42m BID. Continue norvasc 5mg 75mqDay. Continue all other current orders and PRN's without changes - see MAR.  Consultations:    Discharge Concerns:    Estimated LOS: 5-7 days  Other:     Physician Treatment Plan for Primary Diagnosis: Schizoaffective disorder, bipolar type (HCC) [place] Term Goal(s): Improvement in symptoms so as ready for discharge  Short Term Goals: Ability to identify and develop effective coping behaviors will improve  Physician Treatment Plan for Secondary Diagnosis: Principal Problem:   Schizoaffective disorder, bipolar type (HCC) [place] Term Goal(s): Improvement in symptoms so as ready for discharge  Short Term Goals: Ability to demonstrate self-control will improve  I certify that inpatient services furnished can reasonably be expected to improve the patient's condition.    ChrisPennelope Bracken10/31/20191:04 PM

## 2017-11-08 NOTE — Progress Notes (Signed)
Recreation Therapy Notes  Date: 10.31.19 Time: 1000 Location: [address] Dayroom   Group Topic: Communication, Team Building, Problem Solving  Goal Area(s) Addresses:  Patient will effectively work with peer towards shared goal.  Patient will identify skill used to make activity successful.  Patient will identify how skills used during activity can be used to reach post d/c goals.   Intervention: STEM Activity   Activity: [employer]. Patients were provided the following materials: 5 drinking straws, 5 rubber bands, 5 paper clips, 2 index cards, 2 drinking cups, and 2 toilet paper rolls. Using the provided materials patients were asked to build a launching mechanisms to launch a ping pong ball approximately 12 feet. Patients were divided into teams of 3-5.   Education: Pharmacist, community, Building control surveyor.   Education Outcome: Acknowledges education/In group clarification offered/Needs additional education.   Clinical Observations/Feedback: Pt did not attend group.    Caroll Rancher, LRT/CTRS     Caroll Rancher A 11/08/2017 11:55 AM

## 2017-11-08 NOTE — [facility] Counselor (Signed)
Adult Comprehensive Assessment  Patient ID: Chelsea [name], female   DOB: April 12, 1970, 47 y.o.   MRN: [id number]  Information Source: Information source: Patient  Current Stressors:  Patient states their primary concerns and needs for treatment are:: Chelsea [name] stated that her sleep pattern has been off; while watching tv, her mom worried she would take her life because she was joking about it; Patient states their goals for this hospitilization and ongoing recovery are:: Chelsea [name] stated that she will work on her sleep pattern because her mom would appreciate her sleeping at night. Family Relationships: "My sleep and I smoke, they do not want me awake at night and they do not want me to smoke" Chelsea [name] is referring to her parents)  Living/Environment/Situation:  Living Arrangements: Parent Living conditions (as described by patient or guardian): "Everything is cool" Who else lives in the home?: "My older brother, my dad and my mom" How long has patient lived in current situation?: "All my life" What is atmosphere in current home: Loving, Comfortable  Family History:  Marital status: Divorced(Brother confirmed she was divorced approximately 20 years ago) Divorced, when?: see above Are you sexually active?: No What is your sexual orientation?: heterosexual Has your sexual activity been affected by drugs, alcohol, medication, or emotional stress?: None reported Does patient have children?: No  Childhood History:  By whom was/is the patient raised?: Both parents Additional childhood history information: Her brother confirmed that when she was younger she was bright, communicated well and enjoyed being with the family Description of patient's relationship with caregiver when they were a child: Chelsea [name] stated and her brother confirmed that she had gotten along well with her parents  Patient's description of current relationship with people who raised him/her: "It's normal", Chelsea [name] reported. Her  brother confirmed that when Chelsea [name] has a good relationship with her parents when she is well How were you disciplined when you got in trouble as a child/adolescent?: Chelsea [name]'s brother stated that there were no real discipline isssues Does patient have siblings?: Yes Number of Siblings: 1 Description of patient's current relationship with siblings: Her brother confirmed that they get along well Did patient suffer any verbal/emotional/physical/sexual abuse as a child?: No(Her brother stated that he no abuse occurred) Did patient suffer from severe childhood neglect?: No(Confirmed by brother that no neglect took place) Patient description of severe childhood neglect: Refused Has patient ever been sexually abused/assaulted/raped as an adolescent or adult?: No(Chelsea [name], Chelsea [name]'s brother stated no abuse took place) Was the patient ever a victim of a crime or a disaster?: No Witnessed domestic violence?: No Has patient been effected by domestic violence as an adult?: No  Education:  Highest grade of school patient has completed: Chelsea [name] confirmed that she graduated from college Currently a Consulting civil engineer?: No Learning disability?: No  Employment/Work Situation:   Employment situation: On disability Why is patient on disability: Chelsea [name] confirmed that there had been several hospitalizations and the doctors agreed she was unable to work How long has patient been on disability: "About a year and a half" Chelsea [name] stated and Chelsea [name] confirmed. Patient's job has been impacted by current illness: Yes Describe how patient's job has been impacted: She was unable to be productive at work What is the longest time patient has a held a job?: Chelsea [name] stated 3 years Where was the patient employed at that time?: [employer] confirmed that she had worked at [employer] or Education officer, community in Your Home?: No(Chelsea [name] reported that there are no guns in the home) Are These Weapons  Safely Secured?: (Refused to answer)  Financial  Resources:   Financial resources: [employer] SSDI Does patient have a Lawyer or guardian?: No  Alcohol/Substance Abuse:   What has been your use of drugs/alcohol within the last 12 months?: None reported If attempted suicide, did drugs/alcohol play a role in this?: No Alcohol/Substance Abuse Treatment Hx: Denies past history Has alcohol/substance abuse ever caused legal problems?: No  Social Support System:   Patient's Community Support System: Good Describe Community Support System: Chelsea [name] stated that she has a Therapist, sports and her family members. Chelsea [name] confirmed this.  Type of faith/religion: Chelsea [name] stated that Chelsea [name] is nondenominational How does patient's faith help to cope with current illness?: When she is well Chelsea [name] attends [name] stated.  Leisure/Recreation:   Leisure and Hobbies: Going out with her family, reading, enjoying coffee out with her sister-in-law, her brother reported  Strengths/Needs:   What is the patient's perception of their strengths?: Chelsea [name] stated that she would not answer any more questions Patient states they can use these personal strengths during their treatment to contribute to their recovery: Chelsea [name] reported that she would follow up with her psychiatrist as she always has.  Patient states these barriers may affect/interfere with their treatment: Chelsea [name] stated that she did not need any assistance and that it was a misunderstanding between her and her mother that was the only reason she was here. Here brother stated that they cannot make her go if she refuses. Patient states these barriers may affect their return to the community: Chelsea [name] stated that she did not need to answer these questions and was becoming increasingly agitated.  Other important information patient would like considered in planning for their treatment: Refused to answer. Chelsea [name] stated that she has a good rapport with her psychiatrist, Chelsea Newport, [place], in  [name].  Discharge Plan:   Currently receiving community mental health services: Yes (From Whom)(see above) Patient states concerns and preferences for aftercare planning are: None reported Patient states they will know when they are safe and ready for discharge when: None reported Does patient have access to transportation?: Yes(The family will provide) Does patient have financial barriers related to discharge medications?: No Patient description of barriers related to discharge medications: None reported Will patient be returning to same living situation after discharge?: Yes  Summary/Recommendations:   Summary and Recommendations (to be completed by the evaluator): Chelsea [name] is a 47 yo Hispanic female living with her parents in [place]. She is diagnosed with Schizoaffective disorder, Bipolar type. She presents involuntarily. Collateral information was obtained through her brother Chelsea [name] [phone]. Blayke stated she will return home. A hospital follow up appointment will be made with her psychiatrist from [facility] [place]. While here, Chelsea [name] may benefit from crisies stabilization, medication management, a therapeutic milieu and referral to services.  Chelsea [name], MSW Intern. 11/08/2017

## 2017-11-09 MED ORDER — ZOLPIDEM TARTRATE 5 MG PO TABS
5.0000 mg | ORAL_TABLET | Freq: Every evening | ORAL | Status: DC | PRN
  Administered 2017-11-09 – 2017-11-10 (×2): 5 mg via ORAL
  Filled 2017-11-09 (×2): qty 1

## 2017-11-09 NOTE — Progress Notes (Signed)
[facility] MD Progress Note  11/09/2017 1:01 PM Chelsea [name]  MRN:  [id number] Subjective:   History as per psychiatric intake: Chelsea [name] is a 47 y/o F with history of schizophrenia (elsewhere schizoaffective disorder bipolar type) who was admitted from [facility] after being brought in by [facility] on IVC initiated by her family with concern for worsening symptoms of depression, SI, HI, and decreased need for sleep. Pt was medically cleared and then transferred to [facility] for additional treatment and stabilization. Upon initial interview, pt shares, "I was having a little bit of issues at home because I smoke in the house and my sleep pattern has been off." Pt is pleasant, calm, and cooperative with the interview. She feels that the concerns expressed in the IVC filed by her family are exaggerated. She reports that she did make a statement about SI but it was done in a joking manner and she has no intent of harming herself. She has no history of suicide attempt. She denies HI/AH/VH. She reports that her mood has been "a little hyper." She is sleeping 4-5 hours per night and then she will get up and do chores and other activities which is disturbing her family. She denies other symptoms of depression. She denies symptoms of mania, OCD, and PTSD. She denies all illicit substance use including alcohol with exception of tobacco 1/2 ppd.  Discussed with patient about treatment options. She currently is taking abilify, vraylar, trazodone 300mg  qhs, and clonidine. She does not know her other doses, but she knows that she has been on abilify for about 1 year, and her mother would like her to change to a different medication. Pt reports previous trials of zyprexa (caused weight gain) and Invega sustenna (caused intrusive negative thoughts). We discussed about option of choosing a medication with long-acting injectable form, and pt declines due to her previous experience with [facility]. We discussed about discontinuing  abilify and increasing dose of vraylar while continuing her other medications and pt was in agreement. She is in agreement to allow the treatment team to gather additional collateral information from her family. Pt was in agreement with the above treatment plan, and she had no further questions, comments, or concerns.    As per evaluation today: Today upon evaluation, pt shares, "I'm pretty good, but I had trouble sleeping last night." Pt specifies that she had been napping during the day. Discussed with patient about limiting/eliminating naps during the daytime as to help consolidate her sleep to just the evenings, and pt states she will attempt to make that change. She denies any other specific concerns. Her appetite is good. She denies other physical complaints. She denies SI/HI/AH/VH. RN staff report pt appears slightly pressured / hypomanic at times, and she is often pacing on the unit. She is tolerating her medications well. We discussed continuing Vraylar at current dose. Pt reports previous trial of Remus Loffler was very helpful for her sleep, and she is unsure why it was discontinued. We discussed changing trazodone to [place] as to eliminate concern for possible worsening of symptoms of mania/hypomania and to better aid in treating insomnia. Pt was in agreement with that plan, and she had no further questions, comments, or concerns.  Principal Problem: Schizoaffective disorder, bipolar type (HCC) Diagnosis:   Patient Active Problem List   Diagnosis Date Noted  . MDD (major depressive disorder) [F32.9] 11/07/2017  . Schizophrenia (HCC) [F20.9]   . PCP NOTES >>>>>>>>>>>>>>>>>>>> [Z09] 11/28/2016  . Eczema of both upper extremities [L30.9] 10/16/2016  .  Essential hypertension [I10] 10/16/2016  . Smoker [F17.200] 01/12/2016  . Leukocytosis [D72.829] 10/04/2013  . Hyperkalemia [E87.5] 10/04/2013  . Serum ammonia increased (HCC) [E72.20] 10/04/2013  . ALLERGIC RHINITIS [J30.9] 10/26/2008  .  HYPERLIPIDEMIA [E78.5] 01/18/2008  . Schizoaffective disorder, bipolar type (HCC) [F25.0] 01/16/2008   Total Time spent with patient: 30 minutes  Past Psychiatric History: see H&P  Past Medical History:  Past Medical History:  Diagnosis Date  . Allergy   . Bipolar disorder (HCC)   . Depression   . Eczema   . Elevated cholesterol   . Hypertension   . Schizophrenia [facility])     Past Surgical History:  Procedure Laterality Date  . NO PAST SURGERIES     Family History:  Family History  Problem Relation Age of Onset  . Breast cancer Mother   . Hypertension Father   . Diabetes Paternal Grandmother   . Colon cancer Neg Hx    Family Psychiatric  History: see H&P Social History:  Social History   Substance and Sexual Activity  Alcohol Use Yes  . Frequency: Never   Comment:  socially , rare      Social History   Substance and Sexual Activity  Drug Use No    Social History   Socioeconomic History  . Marital status: Divorced    Spouse name: Not on file  . Number of children: Not on file  . Years of education: Not on file  . Highest education level: Not on file  Occupational History  . Occupation: disability   Social Needs  . Financial resource strain: Not on file  . Food insecurity:    Worry: Not on file    Inability: Not on file  . Transportation needs:    Medical: Not on file    Non-medical: Not on file  Tobacco Use  . Smoking status: Current Every Day Smoker    Packs/day: 1.50    Types: Cigarettes  . Smokeless tobacco: Never Used  Substance and Sexual Activity  . Alcohol use: Yes    Frequency: Never    Comment:  socially , rare   . Drug use: No  . Sexual activity: Not Currently  Lifestyle  . Physical activity:    Days per week: Not on file    Minutes per session: Not on file  . Stress: Not on file  Relationships  . Social connections:    Talks on phone: Not on file    Gets together: Not on file    Attends religious service: Not on file    Active  member of club or organization: Not on file    Attends meetings of clubs or organizations: Not on file    Relationship status: Not on file  Other Topics Concern  . Not on file  Social History Narrative   [name]   Lives w/ parents    Father Aubery Date    Additional Social History:                         Sleep: Poor  Appetite:  Good  Current Medications: Current Facility-Administered Medications  Medication Dose Route Frequency Provider Last Rate Last Dose  . acetaminophen (TYLENOL) tablet 650 mg  650 mg Oral Q6H PRN Oneta Rack, NP   650 mg at 11/09/17 0033  . alum & mag hydroxide-simeth (MAALOX/MYLANTA) 200-200-20 MG/5ML suspension 30 mL  30 mL Oral Q4H PRN Oneta Rack, NP      .  amLODipine (NORVASC) tablet 5 mg  5 mg Oral Daily Oneta Rack, NP   5 mg at 11/09/17 0757  . cariprazine (VRAYLAR) capsule 3 mg  3 mg Oral Daily Micheal Likens, MD   3 mg at 11/09/17 0757  . cloNIDine (CATAPRES) tablet 0.1 mg  0.1 mg Oral BID Oneta Rack, NP   0.1 mg at 11/09/17 0757  . LORazepam (ATIVAN) tablet 1 mg  1 mg Oral Q6H PRN Micheal Likens, MD       Or  . LORazepam (ATIVAN) injection 1 mg  1 mg Intramuscular Q6H PRN Micheal Likens, MD      . magnesium hydroxide (MILK OF MAGNESIA) suspension 30 mL  30 mL Oral Daily PRN Oneta Rack, NP      . nicotine polacrilex (NICORETTE) gum 2 mg  2 mg Oral PRN Oneta Rack, NP   2 mg at 11/09/17 0757  . OLANZapine zydis (ZYPREXA) disintegrating tablet 5 mg  5 mg Oral Q8H PRN Micheal Likens, MD       Or  . ziprasidone (GEODON) injection 20 mg  20 mg Intramuscular Q12H PRN Micheal Likens, MD      . traZODone (DESYREL) tablet 300 mg  300 mg Oral QHS Oneta Rack, NP   300 mg at 11/08/17 2117    Lab Results: No results found for this or any previous visit (from the past 48 hour(s)).  Blood Alcohol level:  Lab Results  Component Value Date   ETH <10 11/06/2017     Metabolic Disorder Labs: Lab Results  Component Value Date   HGBA1C 5.6 12/14/2016   No results found for: PROLACTIN Lab Results  Component Value Date   CHOL 173 12/14/2016   TRIG 247.0 (H) 12/14/2016   HDL 36.70 (L) 12/14/2016   CHOLHDL 5 12/14/2016   VLDL 49.4 (H) 12/14/2016   LDLCALC 65 04/15/2009   LDLCALC 134 (H) 01/22/2009    Physical Findings: AIMS: Facial and Oral Movements Muscles of Facial Expression: None, normal Lips and Perioral Area: None, normal Jaw: None, normal Tongue: None, normal,Extremity Movements Upper (arms, wrists, hands, fingers): None, normal Lower (legs, knees, ankles, toes): None, normal, Trunk Movements Neck, shoulders, hips: None, normal, Overall Severity Severity of abnormal movements (highest score from questions above): None, normal Incapacitation due to abnormal movements: None, normal Patient's awareness of abnormal movements (rate only patient's report): No Awareness, Dental Status Current problems with teeth and/or dentures?: No Does patient usually wear dentures?: No  CIWA:    COWS:     Musculoskeletal: Strength & Muscle Tone: within normal limits Gait & Station: normal Patient leans: N/A  Psychiatric Specialty Exam:  Physical Exam  Nursing note and vitals reviewed.   Review of Systems  Constitutional: Negative for chills and fever.  Respiratory: Negative for cough and shortness of breath.   Cardiovascular: Negative for chest pain.  Gastrointestinal: Negative for abdominal pain, heartburn, nausea and vomiting.  Psychiatric/Behavioral: Negative for depression, hallucinations and suicidal ideas. The patient has insomnia. The patient is not nervous/anxious.     Blood pressure 131/84, pulse 72, temperature 98.4 F (36.9 C), temperature source Oral, resp. rate 18, height 5\' 6"  (1.676 m), weight 77.1 kg, last menstrual period 11/06/2017.Body mass index is 27.44 kg/m.  General Appearance: Casual and Fairly Groomed  Eye  Contact:  Good  Speech:  Clear and Coherent and Normal Rate  Volume:  Normal  Mood:  Euthymic  Affect:  Flat  Thought Process:  Coherent and Goal Directed  Orientation:  Full (Time, Place, and Person)  Thought Content:  Illogical  Suicidal Thoughts:  No  Homicidal Thoughts:  No  Memory:  Immediate;   Fair Recent;   Fair Remote;   Fair  Judgement:  Poor  Insight:  Lacking  Psychomotor Activity:  Normal  Concentration:  Concentration: Fair  Recall:  [employer] of Knowledge:  Fair  Language:  Fair  Akathisia:  No  Handed:    AIMS (if indicated):     Assets:  Communication Skills Resilience Social Support  ADL's:  Intact  Cognition:  WNL  Sleep:  Number of Hours: 2   Treatment Plan Summary: Daily contact with patient to assess and evaluate symptoms and progress in treatment and Medication management   -Continue inpatient hospitalization  -Schizoaffective disorder, bipolar type  -Continue vraylar 3mg  po qDay  -anxiety  -Continue clonidine 0.1mg  po BID  -Insomnia   -Discontinue trazodone 300mg  po qhs   -Start ambien 5mg  po qhs prn insomnia  -HTN  -Continue amlodipine 5mg  po qDay  -agitation   -Continue ativan 1mg  po/IM q6h prn agitation   -Continue zydis 5mg  po q8h prn agitation   -Continue geodon 20mg  IM q12h prn severe agitation  -Encourage participation in groups and therapeutic milieu  -disposition planning will be ongoing  Micheal Likens, MD 11/09/2017, 1:01 PM

## 2017-11-09 NOTE — Progress Notes (Signed)
Pt up to the nursing station requesting for something to drink. Pt was offered water, but pt continued to refuse stating she wanted juice, pt was informed she more than enough juice and ginger ale for the evening and her body needed water, but pt stated she did not drink water. Pt was told that she was being too loud but pt continued to get louder. Pt was informed that she could have water. Pt continued to escalate and she woke up another pt Shanda Bumps) who stated she has been listening to pt making noise that woke her up. Pt the became verbally aggressive at staff " Stevie Kern, FUCK YOU " pt continues to try to talk down to writer and at times trying to not even Printmaker. Pt walked by the double doors and started doing leg exercises and when pt was informed she could not do that there she continued and stated she heard Clinical research associate, but continued to be confrontational with Clinical research associate.

## 2017-11-09 NOTE — [facility] Group Notes (Signed)
Date: 11/09/17, 1315  Type of Therapy and Topic: Chaplain group, "Hope is.." Chaplain engaged group in discussion about hope and what it looks like in each patients life and current situation.  Participation level:Did not attend  Modes of Intervention: Discussion, Education and Socialization  Summary of Progress/Problems:   Chelsea [name] 11/09/2017 1:36 PM  [facility] LCSW Group Therapy Note

## 2017-11-09 NOTE — Progress Notes (Signed)
Pt back up to the nursing station requesting something to drink. Pt offered water. "I don't want water, can't I have some juice, milk or coffee . Pt continued to get loud demanding to speak to a supervisor. AC spoke with pt and gave pt pudding.  Through out the night pt received: 5 juices, couple snacks, Meal Tray from dinner, 2 ginger ales . Pt complained that she did not get anything to eat .

## 2017-11-09 NOTE — Progress Notes (Signed)
Pt up by the nursing station talking loud and when pt was asked to bring her voice down due to her being loud , pt stated she was not being loud and started becoming verbally aggressive towards Clinical research associate. Pt continued to ask for something to drink . Pt was informed that she could have water , pt stated she was tired of drinking water , but pt was not observed drinking water on our shift. Pt did have 2 ginger ales and numerous juices through the evening. Pt also told writer earlier in the evening" I do't want any of ya'll water"

## 2017-11-09 NOTE — Plan of Care (Signed)
Progress Note  D: pt found in the dayroom; complaint but paranoid with medications. Pt states she slept well with 5 hours of sleep. Nursing staff stated 2 hours of sleep. Pt rates her depression/hopelessness/anxiety a 0/0/2 out of 10 respectively. Pt denies any physical symptoms or pain, rating her pain a 0/10. Pt failed to have a goal for today or how she would achieve this. Pt continues to respond to internal stimuli, be intrusive with nursing staff, and sometimes argumentative. Pt is having mild flight of ideas. Pt denies any si/hi/ah/vh and verbally agrees to approach staff if these become apparent or before harming herself while at [facility]. A: pt provided support and encouragement. Pt given medications per protocol and standing orders. Q75m safety checks implemented and continued.  R: pt safe on the unit. Will continue to monitor.   Pt progressing in the following metrics  Problem: Education: Goal: Knowledge of [facility] Education information/materials will improve Outcome: Progressing Goal: Verbalization of understanding the information provided will improve Outcome: Progressing   Problem: Coping: Goal: Ability to verbalize frustrations and anger appropriately will improve Outcome: Progressing Goal: Ability to demonstrate self-control will improve Outcome: Progressing   Problem: Health Behavior/Discharge Planning: Goal: Compliance with treatment plan for underlying cause of condition will improve Outcome: Progressing   Problem: Physical Regulation: Goal: Ability to maintain clinical measurements within normal limits will improve Outcome: Progressing   Problem: Activity: Goal: Will verbalize the importance of balancing activity with adequate rest periods Outcome: Progressing   Problem: Health Behavior/Discharge Planning: Goal: Compliance with prescribed medication regimen will improve Outcome: Progressing

## 2017-11-09 NOTE — Progress Notes (Signed)
When pt does not get her way, pt gets upset and state staff doing things to make her upset . Pt does not listen to staff when staff tries to explain the rules, pt just gets loud and ignores staff. Pt presents as if she is entitled and the rules on the unit don't apply to her. Pt was told the dayroom would be open at 6 am and pt did not agree with that ,so she decided to argue with staff stating that was not right  and continuing to raise her voice and not listening when staff tried to talk to he. Pt presents very entitled and condescending toward anyone not giving her what she wants, especially this Clinical research associate.

## 2017-11-09 NOTE — Tx Team (Addendum)
Interdisciplinary Treatment and Diagnostic Plan Update  11/09/2017 Time of Session: [name] MRN: [id number]  Principal Diagnosis: Schizoaffective disorder, bipolar type [facility])  Secondary Diagnoses: Principal Problem:   Schizoaffective disorder, bipolar type ([name])   Current Medications:  Current Facility-Administered Medications  Medication Dose Route Frequency Provider Last Rate Last Dose  . acetaminophen (TYLENOL) tablet 650 mg  650 mg Oral Q6H PRN Derrill Center, NP   650 mg at 11/09/17 0033  . alum & mag hydroxide-simeth (MAALOX/MYLANTA) 200-200-20 MG/5ML suspension 30 mL  30 mL Oral Q4H PRN Derrill Center, NP      . amLODipine (NORVASC) tablet 5 mg  5 mg Oral Daily Derrill Center, NP   5 mg at 11/09/17 0757  . cariprazine (VRAYLAR) capsule 3 mg  3 mg Oral Daily Pennelope Bracken, MD   3 mg at 11/09/17 0757  . cloNIDine (CATAPRES) tablet 0.1 mg  0.1 mg Oral BID Derrill Center, NP   0.1 mg at 11/09/17 0757  . LORazepam (ATIVAN) tablet 1 mg  1 mg Oral Q6H PRN Pennelope Bracken, MD       Or  . LORazepam (ATIVAN) injection 1 mg  1 mg Intramuscular Q6H PRN Pennelope Bracken, MD      . magnesium hydroxide (MILK OF MAGNESIA) suspension 30 mL  30 mL Oral Daily PRN Derrill Center, NP      . nicotine polacrilex (NICORETTE) gum 2 mg  2 mg Oral PRN Derrill Center, NP   2 mg at 11/09/17 1305  . OLANZapine zydis (ZYPREXA) disintegrating tablet 5 mg  5 mg Oral Q8H PRN Pennelope Bracken, MD       Or  . ziprasidone (GEODON) injection 20 mg  20 mg Intramuscular Q12H PRN Pennelope Bracken, MD      . zolpidem (AMBIEN) tablet 5 mg  5 mg Oral QHS PRN Pennelope Bracken, MD       PTA Medications: Medications Prior to Admission  Medication Sig Dispense Refill Last Dose  . amLODipine (NORVASC) 5 MG tablet TAKE 1 TABLET BY MOUTH ONCE DAILY 30 tablet 6 11/05/2017 at Unknown time  . ARIPiprazole (ABILIFY) 10 MG tablet Take 5 mg by mouth daily.   0  11/05/2017 at Unknown time  . Ascorbic Acid (VITAMIN C PO) Take 1 tablet by mouth daily.   11/06/2017 at Unknown time  . atorvastatin (LIPITOR) 20 MG tablet Take 1 tablet (20 mg total) by mouth at bedtime. 30 tablet 6 11/05/2017 at Unknown time  . cariprazine (VRAYLAR) capsule Take 1.5 mg by mouth daily.   11/06/2017 at Unknown time  . cloNIDine (CATAPRES) 0.1 MG tablet Take 1 tablet (0.1 mg total) by mouth 2 (two) times daily. 60 tablet 6 11/06/2017 at Unknown time  . ferrous sulfate 325 (65 FE) MG tablet Take 1 tablet (325 mg total) by mouth 2 (two) times daily. On an empty stomach (Patient not taking: Reported on 08/29/2017) 60 tablet 6 Completed Course at Unknown time  . Multiple Vitamins-Minerals (MULTIVITAMIN ADULT) TABS Take 1 tablet by mouth daily.   11/06/2017 at Unknown time  . trazodone (DESYREL) 300 MG tablet Take 300 mg by mouth at bedtime.   11/05/2017 at Unknown time    Patient Stressors: Marital or family conflict  Patient Strengths: Ability for insight Financial means  Treatment Modalities: Medication Management, Group therapy, Case management,  1 to 1 session with clinician, Psychoeducation, Recreational therapy.   Physician Treatment Plan for Primary Diagnosis: Schizoaffective  disorder, bipolar type ([name]) Long Term Goal(s): Improvement in symptoms so as ready for discharge Improvement in symptoms so as ready for discharge   Short Term Goals: Ability to identify and develop effective coping behaviors will improve Ability to demonstrate self-control will improve  Medication Management: Evaluate patient's response, side effects, and tolerance of medication regimen.  Therapeutic Interventions: 1 to 1 sessions, Unit Group sessions and Medication administration.  Evaluation of Outcomes: Not Met  Physician Treatment Plan for Secondary Diagnosis: Principal Problem:   Schizoaffective disorder, bipolar type ([name])  Long Term Goal(s): Improvement in symptoms so as ready for  discharge Improvement in symptoms so as ready for discharge   Short Term Goals: Ability to identify and develop effective coping behaviors will improve Ability to demonstrate self-control will improve     Medication Management: Evaluate patient's response, side effects, and tolerance of medication regimen.  Therapeutic Interventions: 1 to 1 sessions, Unit Group sessions and Medication administration.  Evaluation of Outcomes: Not Met   RN Treatment Plan for Primary Diagnosis: Schizoaffective disorder, bipolar type ([name]) Long Term Goal(s): Knowledge of disease and therapeutic regimen to maintain health will improve  Short Term Goals: Ability to identify and develop effective coping behaviors will improve and Compliance with prescribed medications will improve  Medication Management: RN will administer medications as ordered by provider, will assess and evaluate patient's response and provide education to patient for prescribed medication. RN will report any adverse and/or side effects to prescribing provider.  Therapeutic Interventions: 1 on 1 counseling sessions, Psychoeducation, Medication administration, Evaluate responses to treatment, Monitor vital signs and CBGs as ordered, Perform/monitor CIWA, COWS, AIMS and Fall Risk screenings as ordered, Perform wound care treatments as ordered.  Evaluation of Outcomes: Not Met   LCSW Treatment Plan for Primary Diagnosis: Schizoaffective disorder, bipolar type ([name]) Long Term Goal(s): Safe transition to appropriate next level of care at discharge, Engage patient in therapeutic group addressing interpersonal concerns.  Short Term Goals: Engage patient in aftercare planning with referrals and resources, Increase social support and Increase skills for wellness and recovery  Therapeutic Interventions: Assess for all discharge needs, 1 to 1 time with Social worker, Explore available resources and support systems, Assess for adequacy in community  support network, Educate family and significant other(s) on suicide prevention, Complete Psychosocial Assessment, Interpersonal group therapy.  Evaluation of Outcomes: Not Met   Progress in Treatment: Attending groups: No. Participating in groups: No. Taking medication as prescribed: Yes. Toleration medication: Yes. Family/Significant other contact made: No, will contact:  daughter Patient understands diagnosis: No. Discussing patient identified problems/goals with staff: Yes. Medical problems stabilized or resolved: Yes. Denies suicidal/homicidal ideation: Yes. Issues/concerns per patient self-inventory: No. Other: none  New problem(s) identified: No, Describe:  none  New Short Term/Long Term Goal(s):  Patient Goals:  "I have no goals"  Discharge Plan or Barriers:   Reason for Continuation of Hospitalization: Depression Medication stabilization  Estimated Length of Stay: 3-5 days.  Attendees: Patient:Chelsea [name] 11/09/2017   Physician: Dr. Nancy Fetter, MD 11/09/2017   Nursing: Neldon Newport, RN 11/09/2017   RN Care Manager: 11/09/2017   Social Worker: Lurline Idol, LCSW 11/09/2017   Recreational Therapist:  11/09/2017  Other:  11/09/2017   Other:  11/09/2017   Other: 11/09/2017        Scribe for Treatment Team: Joanne Chars, LCSW 11/09/2017 1:20 PM

## 2017-11-09 NOTE — Progress Notes (Signed)
Recreation Therapy Notes  Date: 11.1.19 Time: 1000 Location: 500 Hall  Group Topic: Communication  Goal Area(s) Addresses:  Patient will effectively communicate with peers in group.  Patient will effectively communicate to achieve shared group goal. Patient will identify techniques that made activity effective for group.   Intervention: Round rubber discs  Activity: Sharks in [employer].  Each person was given a rubber disc and one disc was given to the group.  Patients were to use the discs to move the group from the starting point to the end of the hall and back.  If anyone stepped off their disc, the group would have to start over.  Education: Communication, Discharge Planning  Education Outcome: Acknowledges understanding/In group clarification offered/Needs additional education.   Clinical Observations/Feedback: Pt did not attend group.    Caroll Rancher, LRT/CTRS         Lillia Abed, Alyannah Sanks A 11/09/2017 11:22 AM

## 2017-11-10 NOTE — Progress Notes (Signed)
Patient has been up on the unit back and forth from her room to the dayroom. Her parents visited on tonight. She took her Remus Loffler but since taking it has been lying down, then up taking a shower, then back up to the dayroom, back to her room for a brief time and then back up the hall requesting snacks. She will not stay in bed and allow medication to work for her. Patient was encouraged to try staying in her bed. Safety maintained on unit with 15 min checks.

## 2017-11-10 NOTE — Progress Notes (Signed)
DAR NOTE: Patient presents with anxious affect and depressed mood. Pt became irritable this morning about her medication, but as the day goes by , pt relaxed but isolative. Stayed in the room most of the shift, not interacting much.  Reports good night sleep, good appetite, normal energy, and good concentration. Denies pain, auditory and visual hallucinations.  Rates depression at 2, hopelessness at 0, and anxiety at 2.  Maintained on routine safety checks.  Medications given as prescribed.  Support and encouragement offered as needed. Will continue to monitor.

## 2017-11-10 NOTE — Progress Notes (Signed)
Writer spoke with patient 1:1 and she reports that she has had a good day. She reports that she still has not been sleeping well and they changed her sleeping medicine. Writer informed her that she will be getting Ambien tonight. She also reports that they are working on her medications since they seemed to not work for her anymore. She as been back and forth to her room tonight but was compliant with her medication. Safety maintained on unit with 15 min checks.

## 2017-11-10 NOTE — Progress Notes (Signed)
Did not attend group 

## 2017-11-10 NOTE — Progress Notes (Addendum)
[facility] MD Progress Note  11/10/2017 1:57 PM Chelsea [name]  MRN:  [id number] Subjective:   Evaluation: Chelsea [name] seen resting in bed.  She is awake alert and oriented x3.  Continues to report that she is feeling better and feels ready to discharge.  Reports she rested well last night with medication adjustment.  Patient reports she is hopeful to see her mom today at visitation.  Reports attending daily group sessions.  Patient reports taking and tolerating medication well.  Continues to deny depression or depressive symptoms.  Denies suicidal or homicidal ideations.  Denies auditory or visual hallucinations.  Chelsea [name] reports a good appetite.  Denies paranoia or paranoia ideations during this assessment.  Patient reports a misunderstanding between she and her mother and her brother as she reports previous inpatient admissions.  Patient continues to report that she was taking medications while at home. Support encouragement and reassurance was provided    Per assessment notes: Chelsea [name] is a 47 y/o F with history of schizophrenia (elsewhere schizoaffective disorder bipolar type) who was admitted from [facility] after being brought in by [facility] on IVC initiated by her family with concern for worsening symptoms of depression, SI, HI, and decreased need for sleep. Pt was medically cleared and then transferred to [facility] for additional treatment and stabilization.Upon initial interview, pt shares, "I was having a little bit of issues at home because I smoke in the house and my sleep pattern has been off." Pt is pleasant, calm, and cooperative with the interview. She feels that the concerns expressed in the IVC filed by her family are exaggerated. She reports that she did make a statement about SI but it was done in a joking manner and she has no intent of harming herself. She has no history of suicide attempt. She denies HI/AH/VH. She reports that her mood has been "a little hyper." She is sleeping 4-5 hours per night and  then she will get up and do chores and other activities which is disturbing her family. She denies other symptoms of depression. She denies symptoms of mania, OCD, and PTSD. She denies all illicit substance use including alcohol with exception of tobacco 1/2 ppd.  .  Principal Problem: Schizoaffective disorder, bipolar type (H[facility]) Diagnosis:   Patient Active Problem List   Diagnosis Date Noted  . MDD (major depressive disorder) [F32.9] 11/07/2017  . Schizophrenia (HCC) [F20.9]   . PCP NOTES >>>>>>>>>>>>>>>>>>>> [Z09] 11/28/2016  . Eczema of both upper extremities [L30.9] 10/16/2016  . Essential hypertension [I10] 10/16/2016  . Smoker [F17.200] 01/12/2016  . Leukocytosis [D72.829] 10/04/2013  . Hyperkalemia [E87.5] 10/04/2013  . Serum ammonia increased (HCC) [E72.20] 10/04/2013  . ALLERGIC RHINITIS [J30.9] 10/26/2008  . HYPERLIPIDEMIA [E78.5] 01/18/2008  . Schizoaffective disorder, bipolar type (HCC) [F25.0] 01/16/2008   Total Time spent with patient: 30 minutes  Past Psychiatric History: see H&P  Past Medical History:  Past Medical History:  Diagnosis Date  . Allergy   . Bipolar disorder (HCC)   . Depression   . Eczema   . Elevated cholesterol   . Hypertension   . Schizophrenia [facility])     Past Surgical History:  Procedure Laterality Date  . NO PAST SURGERIES     Family History:  Family History  Problem Relation Age of Onset  . Breast cancer Mother   . Hypertension Father   . Diabetes Paternal Grandmother   . Colon cancer Neg Hx    Family Psychiatric  History: see H&P Social History:  Social History  Substance and Sexual Activity  Alcohol Use Yes  . Frequency: Never   Comment:  socially , rare      Social History   Substance and Sexual Activity  Drug Use No    Social History   Socioeconomic History  . Marital status: Divorced    Spouse name: Not on file  . Number of children: Not on file  . Years of education: Not on file  . Highest education level:  Not on file  Occupational History  . Occupation: disability   Social Needs  . Financial resource strain: Not on file  . Food insecurity:    Worry: Not on file    Inability: Not on file  . Transportation needs:    Medical: Not on file    Non-medical: Not on file  Tobacco Use  . Smoking status: Current Every Day Smoker    Packs/day: 1.50    Types: Cigarettes  . Smokeless tobacco: Never Used  Substance and Sexual Activity  . Alcohol use: Yes    Frequency: Never    Comment:  socially , rare   . Drug use: No  . Sexual activity: Not Currently  Lifestyle  . Physical activity:    Days per week: Not on file    Minutes per session: Not on file  . Stress: Not on file  Relationships  . Social connections:    Talks on phone: Not on file    Gets together: Not on file    Attends religious service: Not on file    Active member of club or organization: Not on file    Attends meetings of clubs or organizations: Not on file    Relationship status: Not on file  Other Topics Concern  . Not on file  Social History Narrative   [name]   Lives w/ parents    Father Chelsea [name]    Additional Social History:                         Sleep: Poor  Appetite:  Good  Current Medications: Current Facility-Administered Medications  Medication Dose Route Frequency Provider Last Rate Last Dose  . acetaminophen (TYLENOL) tablet 650 mg  650 mg Oral Q6H PRN Oneta Rack, NP   650 mg at 11/09/17 0033  . alum & mag hydroxide-simeth (MAALOX/MYLANTA) 200-200-20 MG/5ML suspension 30 mL  30 mL Oral Q4H PRN Oneta Rack, NP      . amLODipine (NORVASC) tablet 5 mg  5 mg Oral Daily Oneta Rack, NP   5 mg at 11/10/17 0831  . cariprazine (VRAYLAR) capsule 3 mg  3 mg Oral Daily Micheal Likens, MD   3 mg at 11/10/17 0831  . cloNIDine (CATAPRES) tablet 0.1 mg  0.1 mg Oral BID Oneta Rack, NP   0.1 mg at 11/10/17 0831  . LORazepam (ATIVAN) tablet 1 mg  1 mg Oral Q6H PRN Micheal Likens, MD   1 mg at 11/10/17 0147   Or  . LORazepam (ATIVAN) injection 1 mg  1 mg Intramuscular Q6H PRN Micheal Likens, MD      . magnesium hydroxide (MILK OF MAGNESIA) suspension 30 mL  30 mL Oral Daily PRN Oneta Rack, NP      . nicotine polacrilex (NICORETTE) gum 2 mg  2 mg Oral PRN Oneta Rack, NP   2 mg at 11/10/17 1310  . OLANZapine zydis (ZYPREXA) disintegrating tablet 5 mg  5  mg Oral Q8H PRN Micheal Likens, MD       Or  . ziprasidone (GEODON) injection 20 mg  20 mg Intramuscular Q12H PRN Micheal Likens, MD      . zolpidem (AMBIEN) tablet 5 mg  5 mg Oral QHS PRN Micheal Likens, MD   5 mg at 11/09/17 2144    Lab Results: No results found for this or any previous visit (from the past 48 hour(s)).  Blood Alcohol level:  Lab Results  Component Value Date   ETH <10 11/06/2017    Metabolic Disorder Labs: Lab Results  Component Value Date   HGBA1C 5.6 12/14/2016   No results found for: PROLACTIN Lab Results  Component Value Date   CHOL 173 12/14/2016   TRIG 247.0 (H) 12/14/2016   HDL 36.70 (L) 12/14/2016   CHOLHDL 5 12/14/2016   VLDL 49.4 (H) 12/14/2016   LDLCALC 65 04/15/2009   LDLCALC 134 (H) 01/22/2009    Physical Findings: AIMS: Facial and Oral Movements Muscles of Facial Expression: None, normal Lips and Perioral Area: None, normal Jaw: None, normal Tongue: None, normal,Extremity Movements Upper (arms, wrists, hands, fingers): None, normal Lower (legs, knees, ankles, toes): None, normal, Trunk Movements Neck, shoulders, hips: None, normal, Overall Severity Severity of abnormal movements (highest score from questions above): None, normal Incapacitation due to abnormal movements: None, normal Patient's awareness of abnormal movements (rate only patient's report): No Awareness, Dental Status Current problems with teeth and/or dentures?: No Does patient usually wear dentures?: No  CIWA:    COWS:      Musculoskeletal: Strength & Muscle Tone: within normal limits Gait & Station: normal Patient leans: N/A  Psychiatric Specialty Exam:  Physical Exam  Nursing note and vitals reviewed. Constitutional: She appears well-developed.    Review of Systems  Respiratory: Negative for cough and shortness of breath.   Psychiatric/Behavioral: Negative for depression, hallucinations and suicidal ideas. The patient has insomnia (improving ). The patient is not nervous/anxious.   All other systems reviewed and are negative.   Blood pressure (!) 128/98, pulse 90, temperature 98.4 F (36.9 C), temperature source Oral, resp. rate 18, height 5\' 6"  (1.676 m), weight 77.1 kg, last menstrual period 11/06/2017.Body mass index is 27.44 kg/m.  General Appearance: Casual  Eye Contact:  Good  Speech:  Clear and Coherent  Volume:  Normal  Mood:  Euthymic  Affect:  Flat  Thought Process:  Coherent and Goal Directed  Orientation:  Full (Time, Place, and Person)  Thought Content:  Illogical  Suicidal Thoughts:  No  Homicidal Thoughts:  No  Memory:  Immediate;   Fair Recent;   Fair Remote;   Fair  Judgement:  Poor  Insight:  Lacking  Psychomotor Activity:  Normal  Concentration:  Concentration: Fair  Recall:  [employer] of Knowledge:  Fair  Language:  Fair  Akathisia:  No  Handed:    AIMS (if indicated):     Assets:  Communication Skills Resilience Social Support  ADL's:  Intact  Cognition:  WNL  Sleep:  Number of Hours: 2   Treatment Plan Summary: Daily contact with patient to assess and evaluate symptoms and progress in treatment and Medication management   Continue with current treatment plan on 11/10/2017 as listed below except were noted    Schizoaffective disorder, bipolar type  -Continue vraylar 3mg  po qDay  -anxiety  -Continue clonidine 0.1mg  po BID  -Insomnia   -Discontinue trazodone 300mg  po qhs   -Continue Ambien 5mg  po qhs  prn insomnia  -HTN  -Continue amlodipine 5mg   po qDay  -agitation   -Continue ativan 1mg  po/IM q6h prn agitation   -Continue zydis 5mg  po q8h prn agitation   -Continue geodon 20mg  IM q12h prn severe agitation  -Encourage participation in groups and therapeutic milieu -disposition planning will be ongoing  Oneta Rack, NP 11/10/2017, 1:57 PM    ..Agree with NP Progress Note

## 2017-11-10 NOTE — [facility] Group Notes (Signed)
[facility] Group Notes:  (Nursing)  Date:  11/10/2017  Time: 130 PM Type of Therapy:  Nurse Education  Participation Level:  Did Not Attend   Shela Nevin 11/10/2017, 4:46 PM

## 2017-11-10 NOTE — [facility] Group Notes (Signed)
  [facility] LCSW Group Therapy Note  Date/Time:  11/10/2017 11:15AM-12:00PM  Type of Therapy and Topic:  Group Therapy:  Feelings About Hospitalization  Participation Level:  Active   Description of Group This process group involved patients discussing their feelings related to being hospitalized, as well as the benefits they see to being in the hospital.  These feelings and benefits were itemized.  The group then brainstormed specific ways in which they could seek those same benefits when they discharge and return home.  Therapeutic Goals 1. Patient will identify and describe positive and negative feelings related to hospitalization 2. Patient will verbalize benefits of hospitalization to themselves personally 3. Patients will brainstorm together ways they can obtain similar benefits in the outpatient setting, identify barriers to wellness and possible solutions  Summary of Patient Progress:  The patient expressed her primary feelings about being hospitalized are fairly positive because her mother wanted her medications to be changed, as this is happening.  She stated she is ready to go home.  She is tired of a schedule being imposed on her that is unlike her typical schedule at home.  She was able to name staying on her medicine as a significant help toward being well when she goes home.  Therapeutic Modalities Cognitive Behavioral Therapy Motivational Interviewing    Ambrose Mantle, LCSW 11/10/2017, 1:11 PM  .

## 2017-11-11 MED ORDER — ZOLPIDEM TARTRATE 5 MG PO TABS
10.0000 mg | ORAL_TABLET | Freq: Every evening | ORAL | Status: DC | PRN
  Administered 2017-11-11: 10 mg via ORAL
  Filled 2017-11-11: qty 2

## 2017-11-11 MED ORDER — LORAZEPAM 2 MG/ML IJ SOLN
1.0000 mg | Freq: Once | INTRAMUSCULAR | Status: AC
  Administered 2017-11-11: 1 mg via INTRAMUSCULAR

## 2017-11-11 NOTE — Progress Notes (Signed)
Patient ID: Chelsea [name], female   DOB: 1970-02-24, 47 y.o.   MRN: [id number]    D: Pt has remained very isolative today on the unit, she did not interact with peers or staff. Pt did take all medications as prescribed by the doctor, but did not attend any groups. Pt did not engage in any treatment. Pt reported that she was not depressed, hopeless, or anxious. Pt reported that she had no goal for today. Pt reported being negative SI/HI, no AH/VH noted. A: 15 min checks continued for patient safety. R: Pt safety maintained.

## 2017-11-11 NOTE — Progress Notes (Signed)
Patient came to nursing station around 2 am requesting a toothbrush. Writer asked if she could wait until a more appropriate time to brush her teeth and she became upset and reported she was not going back to sleep so she wants to brush her teeth. Writer encouraged her to lie back down which she did . Writer was charting  on patient that is a 1:1 in the hallway and heard someone talking down the hall. Writer walked down the hall and heard Chelsea [name] in the bathroom with the light on and door to bathroom open standing in front of the mirror talking loudly in another language other than english. Writer stepped in the door and asked her to lower her voice so she did not wake up her roommate. She immediately started yelling loudly and pointing her fingers at me saying the I had no right coming in her room while she was in the bathroom. She slammed the bathroom door and continued to yell at me to get out. She had already woke up her roommate who sat up in her bed. Another patient down the hall got up and walked to his door to see what was going on. Writer received an order for one time dose of Ativan 1 mg IM. She also received Geodon 20 mg IM for agitation. She was seated on her bed talking softly to herself when writer informed her that she would be receiving injections to help her calm down. Writer informed her that she had woken others on the hall and she needs to be considerate of others. She reported that she didn't know she was that loud. She took the IM injections and continued to sit on her bed after writer left the room. Safety maintained on unit with 15 min checks.

## 2017-11-11 NOTE — [facility] Group Notes (Signed)
[facility] LCSW Group Therapy Note  Date/Time:  11/11/2017  11:00AM-12:00PM  Type of Therapy and Topic:  Group Therapy:  Music and Mood  Participation Level:  Did Not Attend   Description of Group: In this process group, members listened to a variety of genres of music and identified that different types of music evoke different responses.  Patients were encouraged to identify music that was soothing for them and music that was energizing for them.  Patients discussed how this knowledge can help with wellness and recovery in various ways including managing depression and anxiety as well as encouraging healthy sleep habits.    Therapeutic Goals: 1. Patients will explore the impact of different varieties of music on mood 2. Patients will verbalize the thoughts they have when listening to different types of music 3. Patients will identify music that is soothing to them as well as music that is energizing to them 4. Patients will discuss how to use this knowledge to assist in maintaining wellness and recovery 5. Patients will explore the use of music as a coping skill  Summary of Patient Progress:  N/A  Therapeutic Modalities: Solution Focused Brief Therapy Activity   Ambrose Mantle, LCSW

## 2017-11-11 NOTE — Progress Notes (Addendum)
[facility] MD Progress Note  11/11/2017 11:13 AM Chelsea [name]  MRN:  [id number] Subjective:   Evaluation: Chelsea [name] seen resting in bed, awake alert and oriented x3.  During this assessment patient makes minimal eye contact.  Continues to deny suicidal or homicidal ideations.  Denies auditory or visual hallucinations.  Was reported agitation protocol was initiated on 11/10/2017- RN expressed concerns with patient's insomnia. Reports patient hasn't  been unable to rest well at night and has been irritable and agitated.  Chelsea [name] reports her mother came to visit yesterday, states the family session went well, reports her mom continues to have concern  with her medications. States she need her medication to  being changed and adjusted.  She reports mother said her mood is not the same while at home.  Patient is hopeful to discharge soon.  NP increase Ambien 5 mg to 10 mg p.o. Nightly.  Patient reports taking medications as prescribed and tolerating them well.  Support, encouragement and  reassurance was provided.   Per assessment notes: Chelsea [name] is a 47 y/o F with history of schizophrenia (elsewhere schizoaffective disorder bipolar type) who was admitted from [facility] after being brought in by [facility] on IVC initiated by her family with concern for worsening symptoms of depression, SI, HI, and decreased need for sleep. Pt was medically cleared and then transferred to [facility] for additional treatment and stabilization.Upon initial interview, pt shares, "I was having a little bit of issues at home because I smoke in the house and my sleep pattern has been off." Pt is pleasant, calm, and cooperative with the interview. She feels that the concerns expressed in the IVC filed by her family are exaggerated. She reports that she did make a statement about SI but it was done in a joking manner and she has no intent of harming herself. She has no history of suicide attempt. She denies HI/AH/VH. She reports that her mood has been "a  little hyper." She is sleeping 4-5 hours per night and then she will get up and do chores and other activities which is disturbing her family. She denies other symptoms of depression. She denies symptoms of mania, OCD, and PTSD. She denies all illicit substance use including alcohol with exception of tobacco 1/2 ppd.  .  Principal Problem: Schizoaffective disorder, bipolar type (HCC) Diagnosis:   Patient Active Problem List   Diagnosis Date Noted  . MDD (major depressive disorder) [F32.9] 11/07/2017  . Schizophrenia (HCC) [F20.9]   . PCP NOTES >>>>>>>>>>>>>>>>>>>> [Z09] 11/28/2016  . Eczema of both upper extremities [L30.9] 10/16/2016  . Essential hypertension [I10] 10/16/2016  . Smoker [F17.200] 01/12/2016  . Leukocytosis [D72.829] 10/04/2013  . Hyperkalemia [E87.5] 10/04/2013  . Serum ammonia increased (HCC) [E72.20] 10/04/2013  . ALLERGIC RHINITIS [J30.9] 10/26/2008  . HYPERLIPIDEMIA [E78.5] 01/18/2008  . Schizoaffective disorder, bipolar type (HCC) [F25.0] 01/16/2008   Total Time spent with patient: 30 minutes  Past Psychiatric History: see H&P  Past Medical History:  Past Medical History:  Diagnosis Date  . Allergy   . Bipolar disorder (HCC)   . Depression   . Eczema   . Elevated cholesterol   . Hypertension   . Schizophrenia [facility])     Past Surgical History:  Procedure Laterality Date  . NO PAST SURGERIES     Family History:  Family History  Problem Relation Age of Onset  . Breast cancer Mother   . Hypertension Father   . Diabetes Paternal Grandmother   . Colon cancer Neg Hx  Family Psychiatric  History: see H&P Social History:  Social History   Substance and Sexual Activity  Alcohol Use Yes  . Frequency: Never   Comment:  socially , rare      Social History   Substance and Sexual Activity  Drug Use No    Social History   Socioeconomic History  . Marital status: Divorced    Spouse name: Not on file  . Number of children: Not on file  . Years  of education: Not on file  . Highest education level: Not on file  Occupational History  . Occupation: disability   Social Needs  . Financial resource strain: Not on file  . Food insecurity:    Worry: Not on file    Inability: Not on file  . Transportation needs:    Medical: Not on file    Non-medical: Not on file  Tobacco Use  . Smoking status: Current Every Day Smoker    Packs/day: 1.50    Types: Cigarettes  . Smokeless tobacco: Never Used  Substance and Sexual Activity  . Alcohol use: Yes    Frequency: Never    Comment:  socially , rare   . Drug use: No  . Sexual activity: Not Currently  Lifestyle  . Physical activity:    Days per week: Not on file    Minutes per session: Not on file  . Stress: Not on file  Relationships  . Social connections:    Talks on phone: Not on file    Gets together: Not on file    Attends religious service: Not on file    Active member of club or organization: Not on file    Attends meetings of clubs or organizations: Not on file    Relationship status: Not on file  Other Topics Concern  . Not on file  Social History Narrative   [name]   Lives w/ parents    Father Chelsea [name]    Additional Social History:                         Sleep: Poor  Appetite:  Good  Current Medications: Current Facility-Administered Medications  Medication Dose Route Frequency Provider Last Rate Last Dose  . acetaminophen (TYLENOL) tablet 650 mg  650 mg Oral Q6H PRN Oneta Rack, NP   650 mg at 11/09/17 0033  . alum & mag hydroxide-simeth (MAALOX/MYLANTA) 200-200-20 MG/5ML suspension 30 mL  30 mL Oral Q4H PRN Oneta Rack, NP      . amLODipine (NORVASC) tablet 5 mg  5 mg Oral Daily Oneta Rack, NP   5 mg at 11/11/17 0744  . cariprazine (VRAYLAR) capsule 3 mg  3 mg Oral Daily Micheal Likens, MD   3 mg at 11/11/17 0744  . cloNIDine (CATAPRES) tablet 0.1 mg  0.1 mg Oral BID Oneta Rack, NP   0.1 mg at 11/11/17 0744  .  LORazepam (ATIVAN) tablet 1 mg  1 mg Oral Q6H PRN Micheal Likens, MD   1 mg at 11/10/17 2357   Or  . LORazepam (ATIVAN) injection 1 mg  1 mg Intramuscular Q6H PRN Micheal Likens, MD      . magnesium hydroxide (MILK OF MAGNESIA) suspension 30 mL  30 mL Oral Daily PRN Oneta Rack, NP      . nicotine polacrilex (NICORETTE) gum 2 mg  2 mg Oral PRN Oneta Rack, NP   2 mg at  11/11/17 1021  . OLANZapine zydis (ZYPREXA) disintegrating tablet 5 mg  5 mg Oral Q8H PRN Micheal Likens, MD       Or  . ziprasidone (GEODON) injection 20 mg  20 mg Intramuscular Q12H PRN Micheal Likens, MD   20 mg at 11/11/17 0215  . zolpidem (AMBIEN) tablet 5 mg  5 mg Oral QHS PRN Micheal Likens, MD   5 mg at 11/10/17 2024    Lab Results: No results found for this or any previous visit (from the past 48 hour(s)).  Blood Alcohol level:  Lab Results  Component Value Date   ETH <10 11/06/2017    Metabolic Disorder Labs: Lab Results  Component Value Date   HGBA1C 5.6 12/14/2016   No results found for: PROLACTIN Lab Results  Component Value Date   CHOL 173 12/14/2016   TRIG 247.0 (H) 12/14/2016   HDL 36.70 (L) 12/14/2016   CHOLHDL 5 12/14/2016   VLDL 49.4 (H) 12/14/2016   LDLCALC 65 04/15/2009   LDLCALC 134 (H) 01/22/2009    Physical Findings: AIMS: Facial and Oral Movements Muscles of Facial Expression: None, normal Lips and Perioral Area: None, normal Jaw: None, normal Tongue: None, normal,Extremity Movements Upper (arms, wrists, hands, fingers): None, normal Lower (legs, knees, ankles, toes): None, normal, Trunk Movements Neck, shoulders, hips: None, normal, Overall Severity Severity of abnormal movements (highest score from questions above): None, normal Incapacitation due to abnormal movements: None, normal Patient's awareness of abnormal movements (rate only patient's report): No Awareness, Dental Status Current problems with teeth and/or  dentures?: No Does patient usually wear dentures?: No  CIWA:    COWS:     Musculoskeletal: Strength & Muscle Tone: within normal limits Gait & Station: normal Patient leans: N/A  Psychiatric Specialty Exam:  Physical Exam  Nursing note and vitals reviewed. Constitutional: She is oriented to person, place, and time. She appears well-developed.  Neurological: She is alert and oriented to person, place, and time.  Psychiatric: She has a normal mood and affect.    Review of Systems  Psychiatric/Behavioral: Negative for depression, hallucinations and suicidal ideas. The patient has insomnia (improving ). The patient is not nervous/anxious.   All other systems reviewed and are negative.   Blood pressure 132/88, pulse 92, temperature 98.1 F (36.7 C), temperature source Oral, resp. rate 20, height 5\' 6"  (1.676 m), weight 77.1 kg, last menstrual period 11/06/2017, SpO2 99 %.Body mass index is 27.44 kg/m.  General Appearance: Casual  Eye Contact:  Good  Speech:  Clear and Coherent  Volume:  Normal  Mood:  Euthymic  Affect:  Flat  Thought Process:  Coherent and Goal Directed  Orientation:  Full (Time, Place, and Person)  Thought Content:  Illogical  Suicidal Thoughts:  No  Homicidal Thoughts:  No  Memory:  Immediate;   Fair Recent;   Fair Remote;   Fair  Judgement:  Poor  Insight:  Lacking  Psychomotor Activity:  Normal  Concentration:  Concentration: Fair  Recall:  [employer] of Knowledge:  Fair  Language:  Fair  Akathisia:  No  Handed:    AIMS (if indicated):     Assets:  Communication Skills Resilience Social Support  ADL's:  Intact  Cognition:  WNL  Sleep:  Number of Hours: 5.5   Treatment Plan Summary: Daily contact with patient to assess and evaluate symptoms and progress in treatment and Medication management   Continue with current treatment plan on 11/11/2017 as listed below except were noted  Schizoaffective disorder, bipolar type  -Continue vraylar 3mg   po qDay  -anxiety  -Continue clonidine 0.1mg  po BID  -Insomnia   -Discontinue trazodone 300mg  po qhs   Increased Ambien 5 mg to 10 mg  po qhs prn insomnia  -HTN  -Continue amlodipine 5mg  po qDay  -agitation   -Continue ativan 1mg  po/IM q6h prn agitation   -Continue zydis 5mg  po q8h prn agitation   -Continue geodon 20mg  IM q12h prn severe agitation  -Encourage participation in groups and therapeutic milieu -disposition planning will be ongoing  Oneta Rack, NP 11/11/2017, 11:13 AM   ..Agree with NP Progress Note

## 2017-11-11 NOTE — Progress Notes (Signed)
Patient ID: Chelsea [name], female   DOB: 29-May-1970, 47 y.o.   MRN: [id number] PER STATE REGULATIONS 482.30  THIS CHART WAS REVIEWED FOR MEDICAL NECESSITY WITH RESPECT TO THE PATIENT'S ADMISSION/DURATION OF STAY.  NEXT REVIEW DATE:11/15/17  Loura Halt, RN, BSN CASE MANAGER

## 2017-11-11 NOTE — Progress Notes (Signed)
Patient has been in her room resting she did not attend group but was observed at the end of the hall staring out the window. Writer spoke with her earlier 1:1 and she reports that she has had a good day. Writer informed her of her sleep medication being increased. She has not been up in the dayroom mostly isolating in her room. Safety maintained on unit with 15 min checks.

## 2017-11-11 NOTE — [facility] Group Notes (Signed)
[facility] Group Notes:  (Nursing)  Date:  11/11/2017  Time:  945 am Type of Therapy:  Nurse Education  Participation Level:  Did Not Attend   Shela Nevin 11/11/2017, 10:37 AM

## 2017-11-12 MED ORDER — RISPERIDONE 2 MG PO TABS
2.0000 mg | ORAL_TABLET | Freq: Two times a day (BID) | ORAL | Status: DC
  Administered 2017-11-13 – 2017-11-14 (×3): 2 mg via ORAL
  Filled 2017-11-12 (×8): qty 1

## 2017-11-12 MED ORDER — TEMAZEPAM 15 MG PO CAPS
30.0000 mg | ORAL_CAPSULE | Freq: Every day | ORAL | Status: DC
  Administered 2017-11-12: 30 mg via ORAL
  Filled 2017-11-12: qty 2

## 2017-11-12 MED ORDER — BENZTROPINE MESYLATE 0.5 MG PO TABS
0.5000 mg | ORAL_TABLET | Freq: Two times a day (BID) | ORAL | Status: DC
  Administered 2017-11-13 – 2017-11-14 (×3): 0.5 mg via ORAL
  Filled 2017-11-12 (×8): qty 1

## 2017-11-12 NOTE — Progress Notes (Signed)
[facility] MD Progress Note  11/12/2017 11:10 AM Chelsea [name]  MRN:  [id number] Subjective: remains psychotic - talking to unseen others- not sleeping yet denies sxs in 1- 1asks for d-c  Principal Problem: Schizoaffective disorder, bipolar type (HCC) Diagnosis:   Patient Active Problem List   Diagnosis Date Noted  . MDD (major depressive disorder) [F32.9] 11/07/2017  . Schizophrenia (HCC) [F20.9]   . PCP NOTES >>>>>>>>>>>>>>>>>>>> [Z09] 11/28/2016  . Eczema of both upper extremities [L30.9] 10/16/2016  . Essential hypertension [I10] 10/16/2016  . Smoker [F17.200] 01/12/2016  . Leukocytosis [D72.829] 10/04/2013  . Hyperkalemia [E87.5] 10/04/2013  . Serum ammonia increased (HCC) [E72.20] 10/04/2013  . ALLERGIC RHINITIS [J30.9] 10/26/2008  . HYPERLIPIDEMIA [E78.5] 01/18/2008  . Schizoaffective disorder, bipolar type (HCC) [F25.0] 01/16/2008   Total Time spent with patient: 20 minutes  Past Psychiatric History: as above  Past Medical History:  Past Medical History:  Diagnosis Date  . Allergy   . Bipolar disorder (HCC)   . Depression   . Eczema   . Elevated cholesterol   . Hypertension   . Schizophrenia [facility])     Past Surgical History:  Procedure Laterality Date  . NO PAST SURGERIES     Family History:  Family History  Problem Relation Age of Onset  . Breast cancer Mother   . Hypertension Father   . Diabetes Paternal Grandmother   . Colon cancer Neg Hx    Family Psychiatric  History: no changes  Social History:  Social History   Substance and Sexual Activity  Alcohol Use Yes  . Frequency: Never   Comment:  socially , rare      Social History   Substance and Sexual Activity  Drug Use No    Social History   Socioeconomic History  . Marital status: Divorced    Spouse name: Not on file  . Number of children: Not on file  . Years of education: Not on file  . Highest education level: Not on file  Occupational History  . Occupation: disability   Social Needs  .  Financial resource strain: Not on file  . Food insecurity:    Worry: Not on file    Inability: Not on file  . Transportation needs:    Medical: Not on file    Non-medical: Not on file  Tobacco Use  . Smoking status: Current Every Day Smoker    Packs/day: 1.50    Types: Cigarettes  . Smokeless tobacco: Never Used  Substance and Sexual Activity  . Alcohol use: Yes    Frequency: Never    Comment:  socially , rare   . Drug use: No  . Sexual activity: Not Currently  Lifestyle  . Physical activity:    Days per week: Not on file    Minutes per session: Not on file  . Stress: Not on file  Relationships  . Social connections:    Talks on phone: Not on file    Gets together: Not on file    Attends religious service: Not on file    Active member of club or organization: Not on file    Attends meetings of clubs or organizations: Not on file    Relationship status: Not on file  Other Topics Concern  . Not on file  Social History Narrative   [name]   Lives w/ parents    Father Layla Kesling    Additional Social History:  Sleep: Poor  Appetite:  Good  Current Medications: Current Facility-Administered Medications  Medication Dose Route Frequency Provider Last Rate Last Dose  . acetaminophen (TYLENOL) tablet 650 mg  650 mg Oral Q6H PRN Oneta Rack, NP   650 mg at 11/09/17 0033  . alum & mag hydroxide-simeth (MAALOX/MYLANTA) 200-200-20 MG/5ML suspension 30 mL  30 mL Oral Q4H PRN Oneta Rack, NP      . amLODipine (NORVASC) tablet 5 mg  5 mg Oral Daily Oneta Rack, NP   5 mg at 11/12/17 0818  . benztropine (COGENTIN) tablet 0.5 mg  0.5 mg Oral BID Malvin Johns, MD      . cariprazine (VRAYLAR) capsule 3 mg  3 mg Oral Daily Micheal Likens, MD   3 mg at 11/12/17 0818  . cloNIDine (CATAPRES) tablet 0.1 mg  0.1 mg Oral BID Oneta Rack, NP   0.1 mg at 11/12/17 0817  . LORazepam (ATIVAN) tablet 1 mg  1 mg Oral Q6H PRN Micheal Likens, MD   1 mg at 11/11/17 2300   Or  . LORazepam (ATIVAN) injection 1 mg  1 mg Intramuscular Q6H PRN Micheal Likens, MD      . magnesium hydroxide (MILK OF MAGNESIA) suspension 30 mL  30 mL Oral Daily PRN Oneta Rack, NP      . nicotine polacrilex (NICORETTE) gum 2 mg  2 mg Oral PRN Oneta Rack, NP   2 mg at 11/11/17 1608  . risperiDONE (RISPERDAL) tablet 2 mg  2 mg Oral BID Malvin Johns, MD      . temazepam (RESTORIL) capsule 30 mg  30 mg Oral QHS Malvin Johns, MD      . ziprasidone (GEODON) injection 20 mg  20 mg Intramuscular Q12H PRN Micheal Likens, MD   20 mg at 11/11/17 0215    Lab Results: No results found for this or any previous visit (from the past 48 hour(s)).  Blood Alcohol level:  Lab Results  Component Value Date   ETH <10 11/06/2017    Metabolic Disorder Labs: Lab Results  Component Value Date   HGBA1C 5.6 12/14/2016   No results found for: PROLACTIN Lab Results  Component Value Date   CHOL 173 12/14/2016   TRIG 247.0 (H) 12/14/2016   HDL 36.70 (L) 12/14/2016   CHOLHDL 5 12/14/2016   VLDL 49.4 (H) 12/14/2016   LDLCALC 65 04/15/2009   LDLCALC 134 (H) 01/22/2009    Physical Findings: AIMS: Facial and Oral Movements Muscles of Facial Expression: None, normal Lips and Perioral Area: None, normal Jaw: None, normal Tongue: None, normal,Extremity Movements Upper (arms, wrists, hands, fingers): None, normal Lower (legs, knees, ankles, toes): None, normal, Trunk Movements Neck, shoulders, hips: None, normal, Overall Severity Severity of abnormal movements (highest score from questions above): None, normal Incapacitation due to abnormal movements: None, normal Patient's awareness of abnormal movements (rate only patient's report): No Awareness, Dental Status Current problems with teeth and/or dentures?: No Does patient usually wear dentures?: No  CIWA:    COWS:     Musculoskeletal: Strength & Muscle Tone: within normal  limits Gait & Station: normal Patient leans: N/A  Psychiatric Specialty Exam: Physical Exam  ROS  Blood pressure (!) 132/94, pulse 79, temperature 98.1 F (36.7 C), temperature source Oral, resp. rate 20, height 5\' 6"  (1.676 m), weight 77.1 kg, last menstrual period 11/06/2017, SpO2 99 %.Body mass index is 27.44 kg/m.  General Appearance: Casual  Eye Contact:  Good  Speech:  Clear and Coherent  Volume:  Normal  Mood:  Dysphoric  Affect:  Congruent  Thought Process:  Disorganized  Orientation:  Full (Time, Place, and Person)  Thought Content:  Hallucinations: Auditory and Tangential  Suicidal Thoughts:  No  Homicidal Thoughts:  No  Memory:  Immediate;   Fair  Judgement:  Fair  Insight:  Fair  Psychomotor Activity:  Negative  Concentration:  Concentration: Good  Recall:  Good  Fund of Knowledge:  Good  Language:  Good  Akathisia:  Negative  Handed:  Right  AIMS (if indicated):     Assets:  Communication Skills Desire for Improvement  ADL's:  Intact  Cognition:  WNL  Sleep:  Number of Hours: 0.5   Remains symptomatic - try risperdal and restoril Treatment Plan Summary: Daily contact with patient to assess and evaluate symptoms and progress in treatment  Tanvi Gatling, MD 11/12/2017, 11:10 AM

## 2017-11-12 NOTE — Progress Notes (Signed)
Recreation Therapy Notes  Date: 11.4.19 Time: 1000 Location: 500 Hall Dayroom  Group Topic: Coping Skills  Goal Area(s) Addresses:  Patient will be able to identify positive coping skills. Patient will identify benefits of using coping skills post d/c.  Intervention: Worksheet  Activity: Mind map.  LRT and patients filled in the first 8 boxes of the mind map together (anxiety, depression, exhaustion, mood, frustration, stress, work and anger).  Individually, patients would come up with 3 coping skills for each situation.  The group would comeback together and LRT would fill in the coping skills on the board.  Education: Pharmacologist, Building control surveyor.   Education Outcome: Acknowledges understanding/In group clarification offered/Needs additional education.   Clinical Observations/Feedback: Pt did not attend group.     Caroll Rancher, LRT/CTRS     Caroll Rancher A 11/12/2017 11:43 AM

## 2017-11-12 NOTE — Progress Notes (Signed)
Patient denies SI, HI and AVH this shift.  Patient has been noted to talk with unseen other.  Patient has not been compliant with medications nor has she aattended groups this shift.  Patient has had no incidents of behavioral dyscontrol.   Assess patient for safety, offer medications as prescribed, engage patient in 1:1 staff talks.   Patient able to contract for safety.  Continue to monitor as planned.

## 2017-11-12 NOTE — Progress Notes (Signed)
D: Pt denies SI/HI/AVH. Pt has stayed in her room majority of the evening, pt will only come out for food and coffee . Pt continues to interact with an arrogant undertone in her voice.   A: Pt was offered support and encouragement. Pt was given scheduled medications. Pt was encourage to attend groups. Q 15 minute checks were done for safety.  R:  safety maintained on unit.  Problem: Coping: Goal: Ability to demonstrate self-control will improve Outcome: Progressing

## 2017-11-13 MED ORDER — TEMAZEPAM 15 MG PO CAPS
45.0000 mg | ORAL_CAPSULE | Freq: Every day | ORAL | Status: DC
  Administered 2017-11-13: 45 mg via ORAL
  Filled 2017-11-13: qty 3

## 2017-11-13 MED ORDER — MENTHOL 3 MG MT LOZG
1.0000 | LOZENGE | OROMUCOSAL | Status: DC | PRN
  Administered 2017-11-13: 3 mg via ORAL

## 2017-11-13 NOTE — Progress Notes (Signed)
Pt up requesting ginger ale, orange juice . Pt offered water but pt stated she drank too much water yesterday, then she later stated I don't drink water. "who drinks water at this time of night, who starts their day drinking water"

## 2017-11-13 NOTE — Progress Notes (Signed)
Pt continues to to get change the subject and continue to state she needs something to drink "juice" "I feel dizzy and I'm sweating" . Even though pt continues to pace and shout and does not show any signs of being dizzy. When pt asked what her symptoms are pt continues to raise her voice stating she needs a juice. Pt was asked did she feel her blood sugar was dropping, but pt continued to escalate and discount what writer was asking and pt only wanted to focus on what she wanted to talk about. "can I have one of those survey things" . Pt was informed she would get one on D/C . Pt has continued to constantly request juice and snacks stating the last time she had something to eat was at 5 oclock in the afternoon, but pt was informed she has had numerous snacks since then, and pt gets upset stating "that's not food". Pt continues to have a problem with rules when she does not agree with them.

## 2017-11-13 NOTE — Progress Notes (Signed)
Pt came back to the nursing station stating I need to speak to someone who can fix my problem . The Charge nurse brandon was called and he offered her some water and talked to pt , but pt continued to stress to him " I'm dizzy and I need juice or a cheese stick to help me  " " I've been here 8 days and I've lost 20 lbs already" Pt continues to pace the hall complaining showing no signs of dizziness. Then pt started to berate staff and making fun of staff.

## 2017-11-13 NOTE — Progress Notes (Signed)
Pt up and in the dayroom, pt had no complaints of being dizzy nor did she appear to be in any distress.

## 2017-11-13 NOTE — Progress Notes (Signed)
Recreation Therapy Notes  Date: 11.5.19 Time: 1000 Location: 500 Hall Dayroom  Group Topic: Wellness  Goal Area(s) Addresses:  Patient will define components of whole wellness. Patient will verbalize benefit of whole wellness.  Intervention: Music  Activity:  Exercise.  LRT lead group in a series of stretches to get them warmed up to complete the exercises.  Each patient would then lead the group in an exercise of their choice.  The group would complete 30 minutes of exercise.  Patients could take water break when needed.  Education: Wellness, Building control surveyor.   Education Outcome: Acknowledges education/In group clarification offered/Needs additional education.   Clinical Observations/Feedback: Pt did not attend group.    Caroll Rancher, LRT/CTRS      Lillia Abed, Noell Lorensen A 11/13/2017 11:07 AM

## 2017-11-13 NOTE — Progress Notes (Signed)
Pt comes to the nursing station numerous times" Can I have that juice or gingerale " pt was told numerous times she could only have water . Pt was educated that her body needs plenty of water to function properly. Pt did finally request some water and was given some.

## 2017-11-13 NOTE — [facility] Group Notes (Signed)
[facility] LCSW Group Therapy Note  Date/Time: 11/13/17, 1100  Type of Therapy/Topic:  Group Therapy:  Feelings about Diagnosis  Participation Level:  Active   Mood:pleasant   Description of Group:    This group will allow patients to explore their thoughts and feelings about diagnoses they have received. Patients will be guided to explore their level of understanding and acceptance of these diagnoses. Facilitator will encourage patients to process their thoughts and feelings about the reactions of others to their diagnosis, and will guide patients in identifying ways to discuss their diagnosis with significant others in their lives. This group will be process-oriented, with patients participating in exploration of their own experiences as well as giving and receiving support and challenge from other group members.   Therapeutic Goals: 1. Patient will demonstrate understanding of diagnosis as evidence by identifying two or more symptoms of the disorder:  2. Patient will be able to express two feelings regarding the diagnosis 3. Patient will demonstrate ability to communicate their needs through discussion and/or role plays  Summary of Patient Progress:Pt more engaged in group today, attentive, and participated somewhat in the group discussion.  Pt said she did not know what her diagnosis was but did make comments about several symptoms of schizophrenia and bipolar disorder.  Pt made several comments identifying with mental health stigma during that part of the discussion.        Therapeutic Modalities:   Cognitive Behavioral Therapy Brief Therapy Feelings Identification   Daleen Squibb, LCSW

## 2017-11-13 NOTE — Progress Notes (Signed)
Psychoeducational Group Note  Date:  11/13/2017 Time:  2114  Group Topic/Focus:  Wrap-Up Group:   The focus of this group is to help patients review their daily goal of treatment and discuss progress on daily workbooks.  Participation Level: Did Not Attend  Participation Quality:  Not Applicable  Affect:  Not Applicable  Cognitive:  Not Applicable  Insight:  Not Applicable  Engagement in Group: Not Applicable  Additional Comments:  The patient did not attend group this evening since she was asleep in her bed.   Hazle Coca S 11/13/2017, 9:14 PM

## 2017-11-13 NOTE — Progress Notes (Signed)
Pt up at the nursing station complaining she wants her Ambien. Pt was informed that the doctor D/C her Ambien and she received Restoril this evening . Pt continues to become loud and defiant towards Clinical research associate. Pt given 1 mg of Ativan per [facility]

## 2017-11-13 NOTE — Progress Notes (Signed)
Pt pacing the unit talking to people not seen by staff in spanish at times.

## 2017-11-13 NOTE — Progress Notes (Signed)
Pt came back to the nursing station" I just couldn't drink that water, I just can't tolerate water"

## 2017-11-13 NOTE — Plan of Care (Signed)
  Problem: Education: Goal: Emotional status will improve Outcome: Not Progressing   Problem: Activity: Goal: Interest or engagement in activities will improve Outcome: Progressing   Problem: Safety: Goal: Periods of time without injury will increase Outcome: Progressing  DAR NOTE: Patient presents with anxious affect and depressed mood.  Denies suicidal thoughts, pain, auditory and visual hallucinations.  Very argumentative with staff.  Rates depression at 0, hopelessness at 0, and anxiety at 0.  Maintained on routine safety checks.  Medications given as prescribed.  Support and encouragement offered as needed.  States goal for today is discharge."  Patient observed pacing the unit and responding to internal stimuli.  Patient remained safe on and off the unit.

## 2017-11-13 NOTE — Progress Notes (Signed)
Pt acts very childish and has no concept of rules and if she does not get her way she becomes upset.

## 2017-11-13 NOTE — Progress Notes (Signed)
[facility] MD Progress Note  11/13/2017 8:57 AM Chelsea [name]  MRN:  [id number] Subjective:  Described as argumentative and less sleep than optimal- pt irritable at times states we did not discuss med changes in detail - may have confused risperdal and restoril discussion as one-  Principal Problem: Schizoaffective disorder, bipolar type (HCC) Diagnosis:   Patient Active Problem List   Diagnosis Date Noted  . MDD (major depressive disorder) [F32.9] 11/07/2017  . Schizophrenia (HCC) [F20.9]   . PCP NOTES >>>>>>>>>>>>>>>>>>>> [Z09] 11/28/2016  . Eczema of both upper extremities [L30.9] 10/16/2016  . Essential hypertension [I10] 10/16/2016  . Smoker [F17.200] 01/12/2016  . Leukocytosis [D72.829] 10/04/2013  . Hyperkalemia [E87.5] 10/04/2013  . Serum ammonia increased (HCC) [E72.20] 10/04/2013  . ALLERGIC RHINITIS [J30.9] 10/26/2008  . HYPERLIPIDEMIA [E78.5] 01/18/2008  . Schizoaffective disorder, bipolar type (HCC) [F25.0] 01/16/2008   Total Time spent with patient: 20 minutes  Past Psychiatric History: past h/o schizoaffectve  Past Medical History:  Past Medical History:  Diagnosis Date  . Allergy   . Bipolar disorder (HCC)   . Depression   . Eczema   . Elevated cholesterol   . Hypertension   . Schizophrenia [facility])     Past Surgical History:  Procedure Laterality Date  . NO PAST SURGERIES     Family History:  Family History  Problem Relation Age of Onset  . Breast cancer Mother   . Hypertension Father   . Diabetes Paternal Grandmother   . Colon cancer Neg Hx    Family Psychiatric  History: no changes  Social History:  Social History   Substance and Sexual Activity  Alcohol Use Yes  . Frequency: Never   Comment:  socially , rare      Social History   Substance and Sexual Activity  Drug Use No    Social History   Socioeconomic History  . Marital status: Divorced    Spouse name: Not on file  . Number of children: Not on file  . Years of education: Not on file   . Highest education level: Not on file  Occupational History  . Occupation: disability   Social Needs  . Financial resource strain: Not on file  . Food insecurity:    Worry: Not on file    Inability: Not on file  . Transportation needs:    Medical: Not on file    Non-medical: Not on file  Tobacco Use  . Smoking status: Current Every Day Smoker    Packs/day: 1.50    Types: Cigarettes  . Smokeless tobacco: Never Used  Substance and Sexual Activity  . Alcohol use: Yes    Frequency: Never    Comment:  socially , rare   . Drug use: No  . Sexual activity: Not Currently  Lifestyle  . Physical activity:    Days per week: Not on file    Minutes per session: Not on file  . Stress: Not on file  Relationships  . Social connections:    Talks on phone: Not on file    Gets together: Not on file    Attends religious service: Not on file    Active member of club or organization: Not on file    Attends meetings of clubs or organizations: Not on file    Relationship status: Not on file  Other Topics Concern  . Not on file  Social History Narrative   [name]   Lives w/ parents    Father Ellasyn Swilling    Additional  Social History:                         Sleep: Poor  Appetite:  Good  Current Medications: Current Facility-Administered Medications  Medication Dose Route Frequency Provider Last Rate Last Dose  . acetaminophen (TYLENOL) tablet 650 mg  650 mg Oral Q6H PRN Oneta Rack, NP   650 mg at 11/09/17 0033  . alum & mag hydroxide-simeth (MAALOX/MYLANTA) 200-200-20 MG/5ML suspension 30 mL  30 mL Oral Q4H PRN Oneta Rack, NP      . amLODipine (NORVASC) tablet 5 mg  5 mg Oral Daily Oneta Rack, NP   5 mg at 11/13/17 0753  . benztropine (COGENTIN) tablet 0.5 mg  0.5 mg Oral BID Malvin Johns, MD   0.5 mg at 11/13/17 0754  . cariprazine (VRAYLAR) capsule 3 mg  3 mg Oral Daily Micheal Likens, MD   3 mg at 11/13/17 0754  . cloNIDine (CATAPRES) tablet 0.1  mg  0.1 mg Oral BID Oneta Rack, NP   0.1 mg at 11/13/17 0754  . LORazepam (ATIVAN) tablet 1 mg  1 mg Oral Q6H PRN Micheal Likens, MD   1 mg at 11/13/17 0031   Or  . LORazepam (ATIVAN) injection 1 mg  1 mg Intramuscular Q6H PRN Micheal Likens, MD      . magnesium hydroxide (MILK OF MAGNESIA) suspension 30 mL  30 mL Oral Daily PRN Oneta Rack, NP      . menthol-cetylpyridinium (CEPACOL) lozenge 3 mg  1 lozenge Oral PRN Nira Conn A, NP   3 mg at 11/13/17 0355  . nicotine polacrilex (NICORETTE) gum 2 mg  2 mg Oral PRN Oneta Rack, NP   2 mg at 11/13/17 0756  . risperiDONE (RISPERDAL) tablet 2 mg  2 mg Oral BID Malvin Johns, MD   2 mg at 11/13/17 0754  . temazepam (RESTORIL) capsule 45 mg  45 mg Oral QHS Malvin Johns, MD      . ziprasidone (GEODON) injection 20 mg  20 mg Intramuscular Q12H PRN Micheal Likens, MD   20 mg at 11/11/17 0215    Lab Results: No results found for this or any previous visit (from the past 48 hour(s)).  Blood Alcohol level:  Lab Results  Component Value Date   ETH <10 11/06/2017    Metabolic Disorder Labs: Lab Results  Component Value Date   HGBA1C 5.6 12/14/2016   No results found for: PROLACTIN Lab Results  Component Value Date   CHOL 173 12/14/2016   TRIG 247.0 (H) 12/14/2016   HDL 36.70 (L) 12/14/2016   CHOLHDL 5 12/14/2016   VLDL 49.4 (H) 12/14/2016   LDLCALC 65 04/15/2009   LDLCALC 134 (H) 01/22/2009    Physical Findings: AIMS: Facial and Oral Movements Muscles of Facial Expression: None, normal Lips and Perioral Area: None, normal Jaw: None, normal Tongue: None, normal,Extremity Movements Upper (arms, wrists, hands, fingers): None, normal Lower (legs, knees, ankles, toes): None, normal, Trunk Movements Neck, shoulders, hips: None, normal, Overall Severity Severity of abnormal movements (highest score from questions above): None, normal Incapacitation due to abnormal movements: None,  normal Patient's awareness of abnormal movements (rate only patient's report): No Awareness, Dental Status Current problems with teeth and/or dentures?: No Does patient usually wear dentures?: No  CIWA:    COWS:     Musculoskeletal: Strength & Muscle Tone: within normal limits Gait & Station: normal Patient leans: N/A  Psychiatric Specialty Exam: Physical Exam  ROS  Blood pressure (!) 132/98, pulse 85, temperature 98.7 F (37.1 C), temperature source Oral, resp. rate 20, height 5\' 6"  (1.676 m), weight 77.1 kg, last menstrual period 11/06/2017, SpO2 99 %.Body mass index is 27.44 kg/m.  General Appearance: Casual  Eye Contact:  Good  Speech:  Clear and Coherent  Volume:  Increased  Mood:  Irritable  Affect:  Congruent  Thought Process:  Goal Directed  Orientation:  Full (Time, Place, and Person)  Thought Content:  Tangential  Suicidal Thoughts:  No  Homicidal Thoughts:  No  Memory:  Immediate;   Good  Judgement:  Good  Insight:  Good  Psychomotor Activity:  Normal  Concentration:  Concentration: Good  Recall:  Good  Fund of Knowledge:  Good  Language:  Good  Akathisia:  NA  Handed:  Right  AIMS (if indicated):     Assets:  Communication Skills  ADL's:  Intact  Cognition:  WNL  Sleep:  Number of Hours: 3.5   Still hypomanic but improved meds reviewed may go in am   Treatment Plan Summary: Daily contact with patient to assess and evaluate symptoms and progress in treatment  Altovise Wahler, MD 11/13/2017, 8:57 AM

## 2017-11-14 MED ORDER — CLONIDINE HCL 0.1 MG PO TABS: 0.1000 mg | ORAL_TABLET | Freq: Two times a day (BID) | ORAL | 0 refills | Status: DC

## 2017-11-14 MED ORDER — CARIPRAZINE HCL 3 MG PO CAPS: 3.0000 mg | ORAL_CAPSULE | Freq: Every day | ORAL | 0 refills | Status: DC

## 2017-11-14 MED ORDER — TEMAZEPAM 22.5 MG PO CAPS: 45.0000 mg | ORAL_CAPSULE | Freq: Every day | ORAL | 0 refills | Status: DC

## 2017-11-14 MED ORDER — BENZTROPINE MESYLATE 0.5 MG PO TABS: 0.5000 mg | ORAL_TABLET | Freq: Two times a day (BID) | ORAL | 0 refills | Status: DC

## 2017-11-14 MED ORDER — ATORVASTATIN CALCIUM 20 MG PO TABS: 20.0000 mg | ORAL_TABLET | Freq: Every day | ORAL | 0 refills | Status: DC

## 2017-11-14 MED ORDER — AMLODIPINE BESYLATE 5 MG PO TABS: 5.0000 mg | ORAL_TABLET | Freq: Every day | ORAL | 0 refills | Status: DC

## 2017-11-14 MED ORDER — NICOTINE POLACRILEX 2 MG MT GUM: 2.0000 mg | CHEWING_GUM | OROMUCOSAL | 0 refills | Status: DC | PRN

## 2017-11-14 MED ORDER — RISPERIDONE 2 MG PO TABS: 2.0000 mg | ORAL_TABLET | Freq: Two times a day (BID) | ORAL | 0 refills | Status: DC

## 2017-11-14 NOTE — Progress Notes (Signed)
D: Pt A & O X 3. Denies SI, HI, AVH and pain at this time. Per pt "I'm ready to go home, the doctor told me I will leave today and I already told my mom". Rates her depression, hopelessness and anxiety all 0/10 when assessed. Reports she slept well with good appetite, high energy and good concentration level. Pt goal today is "go home". Pt d/c home as ordered. Unit was called that pt's brother was in lobby, however, upon d/c and arrival to lobby brother was not there. Pt decided to walk to [place] "last time my mom went over there to get me so I rather walk over there". Pt was advised to come back to lobby and call mom so mom can locate her brother but she declined "no, I rather walk because he took a break from work to come and get me so he maybe over there". A: D/C instructions reviewed with pt including prescriptions and follow up appointment; compliance encouraged. All belongings from locker given to pt at time of departure. Scheduled and PRN medications given with verbal education and effects monitored. Safety checks maintained without incident till time of d/c.  R: Pt receptive to care. Compliant with medications when offered. Denies adverse drug reactions when assessed. Verbalized understanding related to d/c instructions. Signed belonging sheet in agreement with items received from locker. Ambulatory with a steady gait. Appears to be in no physical distress at time of departure.

## 2017-11-14 NOTE — Progress Notes (Signed)
Recreation Therapy Notes  Date: 11.6.19 Time: 1000 Location: 500 Hall Dayroom  Group Topic: Self-Esteem  Goal Area(s) Addresses:  Patient will successfully identify positive attributes about themselves.  Patient will successfully identify benefit of improved self-esteem.   Behavioral Response: Engaged  Intervention: Scientist, clinical (histocompatibility and immunogenetics), scissors, glue stick, magazines  Activity: Brochure About Me.  Patients were to use the supplies provided to create a collage that highlighted things they like, things they would like to do and good qualities about them.    Education:  Self-Esteem, Building control surveyor.   Education Outcome: Acknowledges education/In group clarification offered/Needs additional education  Clinical Observations/Feedback: Pt was appropriate and participated in group.  Pt highlighted food, clothes, coffee and her cell phone.  Pt left before group ended and did not return.    Caroll Rancher, LRT/CTRS         Caroll Rancher A 11/14/2017 11:33 AM

## 2017-11-14 NOTE — Progress Notes (Signed)
Pt up to the nursing station " may I have something sweet my head hurts, maybe my blood sugar dropped , I haven't been eating the food has been horrible" pt continues to walk by the nursing station seeking attention from staff. Pt continues to try to do or say something to get staff to notice her or what she was saying.

## 2017-11-14 NOTE — [facility] Suicide Risk Assessment (Signed)
[facility] Discharge Suicide Risk Assessment   Principal Problem: Schizoaffective disorder, bipolar type [facility]) Discharge Diagnoses: as abive Patient Active Problem List   Diagnosis Date Noted  . MDD (major depressive disorder) [F32.9] 11/07/2017  . Schizophrenia (HCC) [F20.9]   . PCP NOTES >>>>>>>>>>>>>>>>>>>> [Z09] 11/28/2016  . Eczema of both upper extremities [L30.9] 10/16/2016  . Essential hypertension [I10] 10/16/2016  . Smoker [F17.200] 01/12/2016  . Leukocytosis [D72.829] 10/04/2013  . Hyperkalemia [E87.5] 10/04/2013  . Serum ammonia increased (HCC) [E72.20] 10/04/2013  . ALLERGIC RHINITIS [J30.9] 10/26/2008  . HYPERLIPIDEMIA [E78.5] 01/18/2008  . Schizoaffective disorder, bipolar type (HCC) [F25.0] 01/16/2008    Total Time spent with patient: 45 minutes  Musculoskeletal: Strength & Muscle Tone: within normal limits Gait & Station: normal Patient leans: N/A  Psychiatric Specialty Exam: ROS  Blood pressure 118/88, pulse 78, temperature 98.7 F (37.1 C), temperature source Oral, resp. rate 18, height 5\' 6"  (1.676 m), weight 77.1 kg, last menstrual period 11/06/2017, SpO2 99 %.Body mass index is 27.44 kg/m.  General Appearance: Casual  Eye Contact::  Good  Speech:  Clear and Coherent409  Volume:  Normal  Mood:  Euthymic  Affect:  Appropriate  Thought Process:  Goal Directed  Orientation:  Full (Time, Place, and Person)  Thought Content:  Logical  Suicidal Thoughts:  No  Homicidal Thoughts:  No  Memory:  Immediate;   Good  Judgement:  Good  Insight:  Good  Psychomotor Activity:  Normal  Concentration:  Good  Recall:  Good  Fund of Knowledge:Good  Language: Good  Akathisia:  Negative  Handed:  Right  AIMS (if indicated):     Assets:  Communication Skills  Sleep:  Number of Hours: 5.5  Cognition: WNL  ADL's:  Intact   Mental Status Per Nursing Assessment::   On Admission:  Suicidal ideation indicated by others  Demographic Factors:  Unemployed  Loss  Factors: Decrease in vocational status  Historical Factors: NA  Risk Reduction Factors:   Positive coping skills or problem solving skills  Continued Clinical Symptoms:  Depression:   Severe  Cognitive Features That Contribute To Risk:  None    Suicide Risk:  Minimal: No identifiable suicidal ideation.  Patients presenting with no risk factors but with morbid ruminations; may be classified as minimal risk based on the severity of the depressive symptoms  Follow-up Information    [facility] [employer]. Go on 11/20/2017.   Why:  Please attend your medication appt with Yvette Rack on Tuesday, 11/20/17, at 1:30pm.   Contact information: [id number] [place] [address] [phone]           Plan Of Care/Follow-up recommendations:  Activity:  full  Chelsea Claire, MD 11/14/2017, 8:50 AM

## 2017-11-14 NOTE — Discharge Summary (Signed)
Physician Discharge Summary Note  Patient:  Chelsea [name] is an 47 y.o., female  MRN:  [id number]  DOB:  1970/11/04  Patient phone:  [phone] (home)   Patient address:   [address] [place] [address],   Total Time spent with patient: Greater than 30 minutes  Date of Admission:  11/07/2017  Date of Discharge: 11-14-17  Reason for Admission: Worsening symptoms of depression, SI, HI, and decreased need for sleep.  Principal Problem: Schizoaffective disorder, bipolar type [facility])  Discharge Diagnoses: Patient Active Problem List   Diagnosis Date Noted  . MDD (major depressive disorder) [F32.9] 11/07/2017  . Schizophrenia (HCC) [F20.9]   . PCP NOTES >>>>>>>>>>>>>>>>>>>> [Z09] 11/28/2016  . Eczema of both upper extremities [L30.9] 10/16/2016  . Essential hypertension [I10] 10/16/2016  . Smoker [F17.200] 01/12/2016  . Leukocytosis [D72.829] 10/04/2013  . Hyperkalemia [E87.5] 10/04/2013  . Serum ammonia increased (HCC) [E72.20] 10/04/2013  . ALLERGIC RHINITIS [J30.9] 10/26/2008  . HYPERLIPIDEMIA [E78.5] 01/18/2008  . Schizoaffective disorder, bipolar type (HCC) [F25.0] 01/16/2008   Past Psychiatric History: Hx. MDD, Schizophrenia.  Past Medical History:  Past Medical History:  Diagnosis Date  . Allergy   . Bipolar disorder (HCC)   . Depression   . Eczema   . Elevated cholesterol   . Hypertension   . Schizophrenia [facility])     Past Surgical History:  Procedure Laterality Date  . NO PAST SURGERIES     Family History:  Family History  Problem Relation Age of Onset  . Breast cancer Mother   . Hypertension Father   . Diabetes Paternal Grandmother   . Colon cancer Neg Hx    Family Psychiatric  History: See H&P.  Social History:  Social History   Substance and Sexual Activity  Alcohol Use Yes  . Frequency: Never   Comment:  socially , rare      Social History   Substance and Sexual Activity  Drug Use No    Social History   Socioeconomic  History  . Marital status: Divorced    Spouse name: Not on file  . Number of children: Not on file  . Years of education: Not on file  . Highest education level: Not on file  Occupational History  . Occupation: disability   Social Needs  . Financial resource strain: Not on file  . Food insecurity:    Worry: Not on file    Inability: Not on file  . Transportation needs:    Medical: Not on file    Non-medical: Not on file  Tobacco Use  . Smoking status: Current Every Day Smoker    Packs/day: 1.50    Types: Cigarettes  . Smokeless tobacco: Never Used  Substance and Sexual Activity  . Alcohol use: Yes    Frequency: Never    Comment:  socially , rare   . Drug use: No  . Sexual activity: Not Currently  Lifestyle  . Physical activity:    Days per week: Not on file    Minutes per session: Not on file  . Stress: Not on file  Relationships  . Social connections:    Talks on phone: Not on file    Gets together: Not on file    Attends religious service: Not on file    Active member of club or organization: Not on file    Attends meetings of clubs or organizations: Not on file    Relationship status: Not on file  Other Topics Concern  . Not on file  Social History Narrative   [name]   Lives w/ parents    Father [place] [facility]: (Per Md's admission evaluation): Chelsea [name] is a 47 y/o F with history of schizophrenia (elsewhere schizoaffective disorder bipolar type) who was admitted from [facility] after being brought in by [facility] on IVC initiated by her family with concern for worsening symptoms of depression, SI, HI, and decreased need for sleep. Pt was medically cleared and then transferred to [facility] for additional treatment and stabilization. Upon initial interview, pt shares, "I was having a little bit of issues at home because I smoke in the house and my sleep pattern has been off." Pt is pleasant, calm, and cooperative with the interview. She feels that the concerns  expressed in the IVC filed by her family are exaggerated. She reports that she did make a statement about SI but it was done in a joking manner and she has no intent of harming herself. She has no history of suicide attempt. She denies HI/AH/VH. She reports that her mood has been "a little hyper." She is sleeping 4-5 hours per night and then she will get up and do chores and other activities which is disturbing her family. She denies other symptoms of depression. She denies symptoms of mania, OCD, and PTSD. She denies all illicit substance use including alcohol with exception of tobacco 1/2 ppd.   Chelsea [name] was admitted to the [facility] for worsening symptoms Schizoaffective disorder & crisis management due to suicidal thoughts with plans to shoot herself. She was evaluated & admitted for mood stabilization treatments.  After the above admission assessment, Chelsea [name]'s presenting symptoms were identified. The medication regimen targeting those symptoms were discussed & initiated. She was medicated & discharged on; Cogentin 0.5 mg for EPS, Cariprazine  (Vraylar) 3 mg for depression, Risperdal 2 mg for mood control & Restoril 45 mg for insomnia. She presented other significant pre-existing medical problems that required treatment or monitoring. She was resumed on all her pertinent home medications for those health issues. She tolerated her treatment regimen without any adverse effects reported.  As her treatment progressed, Chelsea [name]'s improvement was monitored by observation & her daily reports of symptom reduction. Her emotional & mental status were also monitored by daily self-inventory assessment reports completed by her & the clinical staff. She was evaluated by the treatment team for mood stability & plans for continued recovery after discharge. She was recommended for further treatment options upon discharge by referring & scheduling her an outpatient psychiatric appointment for follow-up visits & medication  managment as listed below.     Upon discharge, Chelsea [name] was both mentally & medically stable. denying SIHI,, auditory/visual/tactile hallucinations, delusional thoughts & or paranoia.  She left [facility] in no apparent distress.  Physical Findings: AIMS: Facial and Oral Movements Muscles of Facial Expression: None, normal Lips and Perioral Area: None, normal Jaw: None, normal Tongue: None, normal,Extremity Movements Upper (arms, wrists, hands, fingers): None, normal Lower (legs, knees, ankles, toes): None, normal, Trunk Movements Neck, shoulders, hips: None, normal, Overall Severity Severity of abnormal movements (highest score from questions above): None, normal Incapacitation due to abnormal movements: None, normal Patient's awareness of abnormal movements (rate only patient's report): No Awareness, Dental Status Current problems with teeth and/or dentures?: No Does patient usually wear dentures?: No  CIWA:    COWS:     Musculoskeletal: Strength & Muscle Tone: within normal limits Gait & Station: normal Patient leans: N/A  Psychiatric Specialty Exam: Physical Exam  Nursing note and  vitals reviewed. Constitutional: She appears well-developed.  HENT:  Head: Normocephalic.  Eyes: Pupils are equal, round, and reactive to light.  Neck: Normal range of motion.  Cardiovascular: Normal rate.  Respiratory: Effort normal.  GI: Soft.  Genitourinary:  Genitourinary Comments: Deferred  Musculoskeletal: Normal range of motion.  Neurological: She is alert.  Skin: Skin is warm.    Review of Systems  Constitutional: Negative.   HENT: Negative.   Eyes: Negative.   Respiratory: Negative.  Negative for cough.   Cardiovascular: Negative.  Negative for chest pain and palpitations.  Gastrointestinal: Negative.  Negative for abdominal pain, heartburn, nausea and vomiting.  Genitourinary: Negative.   Musculoskeletal: Negative.   Skin: Negative.   Neurological: Negative.   Endo/Heme/Allergies:  Negative.   Psychiatric/Behavioral: Positive for depression (Stable) and hallucinations (Hx. psychosis (Stable)). Negative for memory loss, substance abuse and suicidal ideas. The patient has insomnia (Stable). The patient is not nervous/anxious.     Blood pressure 118/88, pulse 78, temperature 98.7 F (37.1 C), temperature source Oral, resp. rate 18, height 5\' 6"  (1.676 m), weight 77.1 kg, last menstrual period 11/06/2017, SpO2 99 %.Body mass index is 27.44 kg/m.  See Md's discharge SRA   Have you used any form of tobacco in the last 30 days? (Cigarettes, Smokeless Tobacco, Cigars, and/or Pipes): Yes  Has this patient used any form of tobacco in the last 30 days? (Cigarettes, Smokeless Tobacco, Cigars, and/or Pipes):Yes, an FDA-approved tobacco cessation medication was offered at discharge.  Blood Alcohol level:  Lab Results  Component Value Date   ETH <10 11/06/2017   Metabolic Disorder Labs:  Lab Results  Component Value Date   HGBA1C 5.6 12/14/2016   No results found for: PROLACTIN Lab Results  Component Value Date   CHOL 173 12/14/2016   TRIG 247.0 (H) 12/14/2016   HDL 36.70 (L) 12/14/2016   CHOLHDL 5 12/14/2016   VLDL 49.4 (H) 12/14/2016   LDLCALC 65 04/15/2009   LDLCALC 134 (H) 01/22/2009   See Psychiatric Specialty Exam and Suicide Risk Assessment completed by Attending Physician prior to discharge.  Discharge destination:  Home  Is patient on multiple antipsychotic therapies at discharge:  No   Has Patient had three or more failed trials of antipsychotic monotherapy by history:  No  Recommended Plan for Multiple Antipsychotic Therapies: NA  Allergies as of 11/14/2017      Reactions   Divalproex Sodium Er Other (See Comments)   Delirium with hyperammonemia   Aspirin    REACTION: swelling   Fish Allergy       Medication List    STOP taking these medications   ARIPiprazole 10 MG tablet Commonly known as:  ABILIFY   ferrous sulfate 325 (65 FE) MG tablet    MULTIVITAMIN ADULT Tabs   trazodone 300 MG tablet Commonly known as:  DESYREL   VITAMIN C PO     TAKE these medications     Indication  amLODipine 5 MG tablet Commonly known as:  NORVASC Take 1 tablet (5 mg total) by mouth daily. For high blood pressure What changed:  additional instructions  Indication:  High Blood Pressure Disorder   atorvastatin 20 MG tablet Commonly known as:  LIPITOR Take 1 tablet (20 mg total) by mouth at bedtime. For high cholesterol What changed:  additional instructions  Indication:  Inherited Heterozygous Hypercholesterolemia, High Amount of Triglycerides in the Blood   benztropine 0.5 MG tablet Commonly known as:  COGENTIN Take 1 tablet (0.5 mg total) by mouth 2 (two)  times daily. For prevention of drug induced tremors  Indication:  Extrapyramidal Reaction caused by Medications   cariprazine capsule Commonly known as:  VRAYLAR Take 1 capsule (3 mg total) by mouth daily. For depression Start taking on:  11/15/2017 What changed:    medication strength  how much to take  additional instructions  Indication:  Major Depressive Disorder   cloNIDine 0.1 MG tablet Commonly known as:  CATAPRES Take 1 tablet (0.1 mg total) by mouth 2 (two) times daily. For high blood pressure What changed:  additional instructions  Indication:  High Blood Pressure Disorder   nicotine polacrilex 2 MG gum Commonly known as:  NICORETTE Take 1 each (2 mg total) by mouth as needed for smoking cessation. (May but from over the counter): For smoking cessation  Indication:  Nicotine Addiction   risperiDONE 2 MG tablet Commonly known as:  RISPERDAL Take 1 tablet (2 mg total) by mouth 2 (two) times daily. For mood control  Indication:  Mood control   temazepam 22.5 MG capsule Commonly known as:  RESTORIL Take 2 capsules (45 mg total) by mouth at bedtime. For sleep  Indication:  Trouble Sleeping      Follow-up Information    Plc, [facility] [employer]. Go on 11/20/2017.   Why:  Please attend your medication appt with Yvette Rack on Tuesday, 11/20/17, at 1:30pm.   Contact information: [address] [place] [address] [phone]          Follow-up recommendations: Activity:  As tolerated Diet: As recommended by your primary care doctor. Keep all scheduled follow-up appointments as recommended.   Comments: Patient is instructed prior to discharge to: Take all medications as prescribed by his/her mental healthcare provider. Report any adverse effects and or reactions from the medicines to his/her outpatient provider promptly. Patient has been instructed & cautioned: To not engage in alcohol and or illegal drug use while on prescription medicines. In the event of worsening symptoms, patient is instructed to call the crisis hotline, 911 and or go to the nearest ED for appropriate evaluation and treatment of symptoms. To follow-up with his/her primary care provider for your other medical issues, concerns and or health care needs.   Signed: Armandina Stammer, NP, PMHNP, FNP-BC 11/14/2017, 9:55 AM

## 2017-11-14 NOTE — Plan of Care (Signed)
Pt attended one recreation therapy group session.    Eydie Wormley, LRT/CTRS 

## 2017-11-14 NOTE — Progress Notes (Signed)
Recreation Therapy Notes  INPATIENT RECREATION TR PLAN  Patient Details Name: Chelsea [name] MRN: [id number] DOB: 20-Jun-1970 Today's Date: 11/14/2017  Rec Therapy Plan Is patient appropriate for Therapeutic Recreation?: Yes Treatment times per week: about 3 days Estimated Length of Stay: 5-7 days TR Treatment/Interventions: Group participation ([name])  Discharge Criteria Pt will be discharged from therapy if:: Discharged Treatment plan/goals/alternatives discussed and agreed upon by:: Patient/family  Discharge Summary Short term goals set: See patient care plan Short term goals met: Not met Progress toward goals comments: Groups attended Which groups?: Self-esteem Reason goals not met: Pt attended one group session. Therapeutic equipment acquired: N/[name] Reason patient discharged from therapy: Discharge from hospital Pt/family agrees with progress & goals achieved: Yes Date patient discharged from therapy: 11/14/17    Victorino Sparrow, LRT/CTRS  Chelsea [name], Chelsea [name] 11/14/2017, 11:46 AM

## 2017-11-14 NOTE — Progress Notes (Signed)
D: Pt denies SI/HI/AVH. Pt presented more somatic this evening. Pt complained about her feet, pt stated her knee caps were hurting, pt stated she had a H/A and wanted her BP taken because her head was hurting and she felt her Bp was high. 130/95 P-74 RR 18 sitting, 118/88 P- 78 standing . Pt continues to come to the nursing station for numerous complaints.   A: Pt was offered support and encouragement. Pt was given scheduled medications. Pt was encourage to attend groups. Q 15 minute checks were done for safety.  R: safety maintained on unit.

## 2017-11-14 NOTE — Progress Notes (Signed)
°  [facility] Adult Case Management Discharge Plan :  Will you be returning to the same living situation after discharge:  Yes,  with Mother At discharge, do you have transportation home?: Yes,  with Mother Do you have the ability to pay for your medications: Yes,  Medicare  Release of information consent forms completed and in the chart;  Patient's signature needed at discharge.  Patient to Follow up at: Follow-up Information    Plc, [facility] [employer]. Go on 11/20/2017.   Why:  Please attend your medication appt with Yvette Rack on Tuesday, 11/20/17, at 1:30pm.   Contact information: [address] [place] [address] [phone]           Next level of care provider has access to [facility] Link:no  Safety Planning and Suicide Prevention discussed: Yes,  with Mother  Have you used any form of tobacco in the last 30 days? (Cigarettes, Smokeless Tobacco, Cigars, and/or Pipes): Yes  Has patient been referred to the Quitline?: Patient refused referral  Patient has been referred for addiction treatment: N/A  Cherie Bohaboy 11/14/2017, 10:20 AM

## 2017-11-14 NOTE — [facility] Group Notes (Signed)
[facility] Occupational Therapist Group Therapy Note  Date/Time: 11/14/17, 0900  Type of Therapy/Topic:  Stress Management  Participation Level:  Active   Mood:pleasant  Description of Group:    The purpose of this group is to assist patients in learning to develop effective methods to manage stress.  Patients will discuss causes of stress in their lives, potential new strategies to manage stress, and will participate in a group activity to put this into practice.   Therapeutic Goals: 1. Patient will identify two specific strategies to manage stress. 2. Patient will identify and discuss situations that cause stress in their lives.   Summary of Patient Progress: Pt active in group discussion identifying potential ways to manage stress.  Pt participated in the group activity as well and was engaged throughout.         Daleen Squibb, LCSW

## 2017-11-20 DIAGNOSIS — F250 Schizoaffective disorder, bipolar type: Secondary | ICD-10-CM | POA: Diagnosis not present

## 2017-11-20 DIAGNOSIS — F209 Schizophrenia, unspecified: Secondary | ICD-10-CM | POA: Diagnosis not present

## 2017-11-20 DIAGNOSIS — F329 Major depressive disorder, single episode, unspecified: Secondary | ICD-10-CM | POA: Diagnosis not present

## 2017-11-29 ENCOUNTER — Encounter: Payer: Self-pay | Admitting: Internal Medicine

## 2017-11-29 ENCOUNTER — Ambulatory Visit (INDEPENDENT_AMBULATORY_CARE_PROVIDER_SITE_OTHER): Payer: Medicare Other | Admitting: Internal Medicine

## 2017-11-29 VITALS — BP 132/86 | HR 86 | Temp 98.0°F | Resp 16 | Ht 66.0 in | Wt 192.0 lb

## 2017-11-29 DIAGNOSIS — I10 Essential (primary) hypertension: Secondary | ICD-10-CM

## 2017-11-29 DIAGNOSIS — F250 Schizoaffective disorder, bipolar type: Secondary | ICD-10-CM | POA: Diagnosis not present

## 2017-11-29 DIAGNOSIS — E785 Hyperlipidemia, unspecified: Secondary | ICD-10-CM | POA: Diagnosis not present

## 2017-11-29 DIAGNOSIS — Z0000 Encounter for general adult medical examination without abnormal findings: Secondary | ICD-10-CM | POA: Insufficient documentation

## 2017-11-29 NOTE — Assessment & Plan Note (Addendum)
Preventive care reviewed: - declined all vaccines  - PAP 01/2016 per [facility]; had a MMG, no documantation -never had a cscope

## 2017-11-29 NOTE — Patient Instructions (Addendum)
  GO TO THE FRONT DESK Schedule your next appointment for a  Physical exam in 6 months, fasting   Check the  blood pressure  monthly  Be sure your blood pressure is between 110/65 and  135/85. If it is consistently higher or lower, let me know

## 2017-11-29 NOTE — Progress Notes (Signed)
Pre visit review using our clinic review tool, if applicable. No additional management support is needed unless otherwise documented below in the visit note. 

## 2017-11-29 NOTE — Assessment & Plan Note (Signed)
HTN: Currently on Amlodipine, Catapres.  BP satisfactory, last CMP very good.  No change. Hyperlipidemia: On Lipitor, last LFTs normal, will recheck labs on RTC Bipolar, schizophrenia: S/p  admission to the hospital, also saw psychiatrist for outpatient follow-up last week.  Good compliance with medication except that she does not have currently temazepam, her psychiatrist is working on getting that approved by her insurance, in the meantime is having trazodone.  Sleep is broken but overall she is doing well. RTC CPX 6 months

## 2017-11-29 NOTE — Progress Notes (Signed)
Subjective:    Patient ID: Chelsea [name], female    DOB: 1970/03/06, 47 y.o.   MRN: [id number]  DOS:  11/29/2017 Type of visit - description : Follow-up, also recently admitted to the hospital Admitted to the hospital and discharged 11/14/2017 due to worsening depression, suicidal - homicidal ideas and insomnia.  She was evaluated & taking care off by the psychiatry team. Was recommended to follow-up with her psychiatry providers. Since she left the hospital she is doing well. Good compliance with cholesterol and BP meds.  No ambulatory BPs.  Review of Systems Denies suicidal or homicidal ideas at this point.  No depression.  Past Medical History:  Diagnosis Date  . Allergy   . Bipolar disorder (HCC)   . Depression   . Eczema   . Elevated cholesterol   . Hypertension   . Schizophrenia [facility])     Past Surgical History:  Procedure Laterality Date  . NO PAST SURGERIES      Social History   Socioeconomic History  . Marital status: Divorced    Spouse name: Not on file  . Number of children: Not on file  . Years of education: Not on file  . Highest education level: Not on file  Occupational History  . Occupation: disability   Social Needs  . Financial resource strain: Not on file  . Food insecurity:    Worry: Not on file    Inability: Not on file  . Transportation needs:    Medical: Not on file    Non-medical: Not on file  Tobacco Use  . Smoking status: Current Every Day Smoker    Packs/day: 1.50    Types: Cigarettes  . Smokeless tobacco: Never Used  Substance and Sexual Activity  . Alcohol use: Yes    Frequency: Never    Comment:  socially , rare   . Drug use: No  . Sexual activity: Not Currently  Lifestyle  . Physical activity:    Days per week: Not on file    Minutes per session: Not on file  . Stress: Not on file  Relationships  . Social connections:    Talks on phone: Not on file    Gets together: Not on file    Attends religious service: Not on  file    Active member of club or organization: Not on file    Attends meetings of clubs or organizations: Not on file    Relationship status: Not on file  . Intimate partner violence:    Fear of current or ex partner: Not on file    Emotionally abused: Not on file    Physically abused: Not on file    Forced sexual activity: Not on file  Other Topics Concern  . Not on file  Social History Narrative   [name]   Lives w/ parents    Father Taraoluwa Thakur       Allergies as of 11/29/2017      Reactions   Divalproex Sodium Er Other (See Comments)   Delirium with hyperammonemia   Aspirin    REACTION: swelling   Fish Allergy       Medication List        Accurate as of 11/29/17  5:35 PM. Always use your most recent med list.          amLODipine 5 MG tablet Commonly known as:  NORVASC Take 1 tablet (5 mg total) by mouth daily. For high blood pressure   atorvastatin 20 MG tablet  Commonly known as:  LIPITOR Take 1 tablet (20 mg total) by mouth at bedtime. For high cholesterol   benztropine 0.5 MG tablet Commonly known as:  COGENTIN Take 1 tablet (0.5 mg total) by mouth 2 (two) times daily. For prevention of drug induced tremors   cariprazine capsule Commonly known as:  VRAYLAR Take 1 capsule (3 mg total) by mouth daily. For depression   cloNIDine 0.1 MG tablet Commonly known as:  CATAPRES Take 1 tablet (0.1 mg total) by mouth 2 (two) times daily. For high blood pressure   nicotine polacrilex 2 MG gum Commonly known as:  NICORETTE Take 1 each (2 mg total) by mouth as needed for smoking cessation. (May but from over the counter): For smoking cessation   risperiDONE 2 MG tablet Commonly known as:  RISPERDAL Take 1 tablet (2 mg total) by mouth 2 (two) times daily. For mood control   temazepam 22.5 MG capsule Commonly known as:  RESTORIL Take 2 capsules (45 mg total) by mouth at bedtime. For sleep           Objective:   Physical Exam BP 132/86 (BP Location: Left  Arm, Patient Position: Sitting, Cuff Size: Large)   Pulse 86   Temp 98 F (36.7 C)   Resp 16   Ht 5\' 6"  (1.676 m)   Wt 192 lb (87.1 kg)   LMP 11/06/2017   SpO2 97%   BMI 30.99 kg/m  General:   Well developed, NAD, BMI noted. HEENT:  Normocephalic . Face symmetric, atraumatic Lungs:  CTA B Normal respiratory effort, no intercostal retractions, no accessory muscle use. Heart: RRR,  no murmur.  No pretibial edema bilaterally  Skin: Not pale. Not jaundice Neurologic:  alert & oriented X3.  Speech normal, gait appropriate for age and unassisted Psych--  Cognition and judgment appear intact.  Cooperative with normal attention span and concentration.  Behavior appropriate. No anxious or depressed appearing.      Assessment & Plan:    Assessment HTN Hyperlipidemia Bipolar disorder; schizophrenia (dx as teenager); Dr Lorra HalsBetancourt [facility]  Eczema. [facility] Tobacco abuse  Admitted 11-2017 s/i, h/i   PLAN: HTN: Currently on Amlodipine, Catapres.  BP satisfactory, last CMP very good.  No change. Hyperlipidemia: On Lipitor, last LFTs normal, will recheck labs on RTC Bipolar, schizophrenia: S/p  admission to the hospital, also saw psychiatrist for outpatient follow-up last week.  Good compliance with medication except that she does not have currently temazepam, her psychiatrist is working on getting that approved by her insurance, in the meantime is having trazodone.  Sleep is broken but overall she is doing well. RTC CPX 6 months

## 2017-12-10 DIAGNOSIS — F411 Generalized anxiety disorder: Secondary | ICD-10-CM | POA: Diagnosis not present

## 2017-12-10 DIAGNOSIS — G4700 Insomnia, unspecified: Secondary | ICD-10-CM | POA: Diagnosis not present

## 2018-01-14 DIAGNOSIS — F411 Generalized anxiety disorder: Secondary | ICD-10-CM | POA: Diagnosis not present

## 2018-01-14 DIAGNOSIS — G4700 Insomnia, unspecified: Secondary | ICD-10-CM | POA: Diagnosis not present

## 2018-02-11 DIAGNOSIS — G4700 Insomnia, unspecified: Secondary | ICD-10-CM | POA: Diagnosis not present

## 2018-02-11 DIAGNOSIS — F411 Generalized anxiety disorder: Secondary | ICD-10-CM | POA: Diagnosis not present

## 2018-02-25 ENCOUNTER — Telehealth: Payer: Self-pay | Admitting: Internal Medicine

## 2018-02-25 NOTE — Telephone Encounter (Signed)
Pt dropped off document to be filled out by provider ( [employer]- 12 pages) Pt would like to be called when document is ready to pick up at [facility] [phone]. Document put at front office tray under providers name.

## 2018-03-04 NOTE — Telephone Encounter (Signed)
° ° ° °  Pt call to see if her paperwork was ready for pick up

## 2018-03-06 NOTE — Telephone Encounter (Signed)
Completed as much as possible om [employer] DMV paperwork regarding pt's medical conditions; forwarded to provider/SLS 02/26

## 2018-03-07 NOTE — Telephone Encounter (Signed)
Copied from CRM [id number]. Topic: General - Other >> Mar 04, 2018  9:40 AM Percival Spanish wrote:  Pt call to see if her paperwork is ready for pick up >> Mar 06, 2018 11:57 AM Mcneil, Jannifer Rodney wrote: Pt called in once again to see if the paperwork was ready for pick up. Pt requests a call once the paperwork is ready to be picked up.

## 2018-03-08 ENCOUNTER — Emergency Department (HOSPITAL_BASED_OUTPATIENT_CLINIC_OR_DEPARTMENT_OTHER)
Admission: EM | Admit: 2018-03-08 | Discharge: 2018-03-08 | Disposition: A | Discharge status: Home / Self Care | Payer: Medicare Other | Attending: Emergency Medicine | Admitting: Emergency Medicine

## 2018-03-08 ENCOUNTER — Ambulatory Visit (INDEPENDENT_AMBULATORY_CARE_PROVIDER_SITE_OTHER): Payer: Medicare Other | Admitting: Internal Medicine

## 2018-03-08 ENCOUNTER — Other Ambulatory Visit: Payer: Self-pay

## 2018-03-08 ENCOUNTER — Encounter (HOSPITAL_BASED_OUTPATIENT_CLINIC_OR_DEPARTMENT_OTHER): Payer: Self-pay

## 2018-03-08 ENCOUNTER — Encounter: Payer: Self-pay | Admitting: Internal Medicine

## 2018-03-08 ENCOUNTER — Emergency Department (HOSPITAL_BASED_OUTPATIENT_CLINIC_OR_DEPARTMENT_OTHER): Payer: Medicare Other

## 2018-03-08 VITALS — BP 126/72 | HR 77 | Temp 98.0°F | Resp 16 | Ht 66.0 in | Wt 195.1 lb

## 2018-03-08 DIAGNOSIS — I10 Essential (primary) hypertension: Secondary | ICD-10-CM | POA: Insufficient documentation

## 2018-03-08 DIAGNOSIS — R079 Chest pain, unspecified: Secondary | ICD-10-CM

## 2018-03-08 DIAGNOSIS — Z79899 Other long term (current) drug therapy: Secondary | ICD-10-CM | POA: Diagnosis not present

## 2018-03-08 DIAGNOSIS — F17210 Nicotine dependence, cigarettes, uncomplicated: Secondary | ICD-10-CM | POA: Diagnosis not present

## 2018-03-08 LAB — COMPREHENSIVE METABOLIC PANEL
ALBUMIN: 4 g/dL (ref 3.5–5.0)
ALK PHOS: 105 U/L (ref 38–126)
ALT: 44 U/L (ref 0–44)
AST: 24 U/L (ref 15–41)
Anion gap: 7 (ref 5–15)
BUN: 9 mg/dL (ref 6–20)
CALCIUM: 9 mg/dL (ref 8.9–10.3)
CO2: 25 mmol/L (ref 22–32)
CREATININE: 0.62 mg/dL (ref 0.44–1.00)
Chloride: 102 mmol/L (ref 98–111)
GFR calc Af Amer: >60 mL/min (ref >60)
GFR calc non Af Amer: >60 mL/min (ref >60)
GLUCOSE: 86 mg/dL (ref 70–99)
Potassium: 3.7 mmol/L (ref 3.5–5.1)
SODIUM: 134 mmol/L — AB (ref 135–145)
Total Bilirubin: 0.7 mg/dL (ref 0.3–1.2)
Total Protein: 7.5 g/dL (ref 6.5–8.1)

## 2018-03-08 LAB — CBC
HCT: 43.7 % (ref 36.0–46.0)
Hemoglobin: 14.2 g/dL (ref 12.0–15.0)
MCH: 30.1 pg (ref 26.0–34.0)
MCHC: 32.5 g/dL (ref 30.0–36.0)
MCV: 92.6 fL (ref 80.0–100.0)
PLATELETS: 337 10*3/uL (ref 150–400)
RBC: 4.72 MIL/uL (ref 3.87–5.11)
RDW: 13.6 % (ref 11.5–15.5)
WBC: 8.4 10*3/uL (ref 4.0–10.5)
nRBC: 0 % (ref 0.0–0.2)

## 2018-03-08 LAB — PREGNANCY, URINE: Preg Test, Ur: NEGATIVE

## 2018-03-08 LAB — TROPONIN I: Troponin I: <0.03 ng/mL (ref <0.03)

## 2018-03-08 NOTE — Progress Notes (Signed)
Subjective:    Patient ID: Chelsea [name], female    DOB: [birth year], 48 y.o.   MRN: [id number]  DOS:  03/08/2018 Type of visit - description: Acute visit, last visit 01-2017 Symptoms a started 2 days ago Had 2 episodes of left-sided chest pain with no radiation.  Episodes lasted between 1 and 2 hours, 1 of the episodes were associated with nausea and she actually vomited once. No diaphoresis. In only one occasion she raise her left arm and the pain got worse.  Also, she had 2 episodes of pressure on both sides of the shoulder, they also lasted 1 or 2 hours and they were not concomitant with the chest pain.   Review of Systems  Denies fever or chills When asked, admits to some DOE 1 day prior to the chest pain. She occasionally coughs whitish sputum, patient thinks these are "smoker's cough". No prolonged car trips or recent airplane trip. No lower extremity pain or edema. No GERD symptoms  Past Medical History:  Diagnosis Date  . Allergy   . Bipolar disorder (HCC)   . Depression   . Eczema   . Elevated cholesterol   . Hypertension   . Schizophrenia [facility])     Past Surgical History:  Procedure Laterality Date  . NO PAST SURGERIES      Social History   Socioeconomic History  . Marital status: Divorced    Spouse name: Not on file  . Number of children: Not on file  . Years of education: Not on file  . Highest education level: Not on file  Occupational History  . Occupation: disability   Social Needs  . Financial resource strain: Not on file  . Food insecurity:    Worry: Not on file    Inability: Not on file  . Transportation needs:    Medical: Not on file    Non-medical: Not on file  Tobacco Use  . Smoking status: Current Every Day Smoker    Packs/day: 1.50    Types: Cigarettes  . Smokeless tobacco: Never Used  Substance and Sexual Activity  . Alcohol use: Yes    Frequency: Never    Comment:  socially , rare   . Drug use: No  . Sexual activity: Not  Currently  Lifestyle  . Physical activity:    Days per week: Not on file    Minutes per session: Not on file  . Stress: Not on file  Relationships  . Social connections:    Talks on phone: Not on file    Gets together: Not on file    Attends religious service: Not on file    Active member of club or organization: Not on file    Attends meetings of clubs or organizations: Not on file    Relationship status: Not on file  . Intimate partner violence:    Fear of current or ex partner: Not on file    Emotionally abused: Not on file    Physically abused: Not on file    Forced sexual activity: Not on file  Other Topics Concern  . Not on file  Social History Narrative   [name]   Lives w/ parents    Father Jnia Rasmuson       Allergies as of 03/08/2018      Reactions   Divalproex Sodium Er Other (See Comments)   Delirium with hyperammonemia   Aspirin    REACTION: swelling   Fish Allergy       Medication  List       Accurate as of March 08, 2018 11:53 AM. Always use your most recent med list.        amLODipine 5 MG tablet Commonly known as:  NORVASC Take 1 tablet (5 mg total) by mouth daily. For high blood pressure   atorvastatin 20 MG tablet Commonly known as:  LIPITOR Take 1 tablet (20 mg total) by mouth at bedtime. For high cholesterol   benztropine 0.5 MG tablet Commonly known as:  COGENTIN Take 1 tablet (0.5 mg total) by mouth 2 (two) times daily. For prevention of drug induced tremors   cariprazine capsule Commonly known as:  VRAYLAR Take 1 capsule (3 mg total) by mouth daily. For depression   cloNIDine 0.1 MG tablet Commonly known as:  CATAPRES Take 1 tablet (0.1 mg total) by mouth 2 (two) times daily. For high blood pressure   nicotine polacrilex 2 MG gum Commonly known as:  NICORETTE Take 1 each (2 mg total) by mouth as needed for smoking cessation. (May but from over the counter): For smoking cessation   risperiDONE 2 MG tablet Commonly known as:   RISPERDAL Take 1 tablet (2 mg total) by mouth 2 (two) times daily. For mood control   temazepam 22.5 MG capsule Commonly known as:  RESTORIL Take 2 capsules (45 mg total) by mouth at bedtime. For sleep           Objective:   Physical Exam BP 126/72 (BP Location: Left Arm, Patient Position: Sitting, Cuff Size: Small)   Pulse 77   Temp 98 F (36.7 C) (Oral)   Resp 16   Ht 5\' 6"  (1.676 m)   Wt 195 lb 2 oz (88.5 kg)   LMP 03/01/2018 (Approximate)   SpO2 96%   BMI 31.49 kg/m  General:   Well developed, NAD, BMI noted.  HEENT:  Normocephalic . Face symmetric, atraumatic Lungs:  CTA B Normal respiratory effort, no intercostal retractions, no accessory muscle use. Heart: RRR,  no murmur.  no pretibial edema bilaterally  Abdomen:  Not distended, soft, non-tender. No rebound or rigidity.   Skin: Not pale. Not jaundice Neurologic:  alert & oriented X3.  Speech normal, gait appropriate for age and unassisted Psych--  Cognition and judgment appear intact.  Cooperative with normal attention span and concentration.  Behavior appropriate. No anxious or depressed appearing.     Assessment    Assessment HTN Hyperlipidemia Bipolar disorder; schizophrenia (dx as teenager); Dr Lorra Hals [facility])  Eczema. [facility]) Tobacco abuse  Admitted 11-2017 s/i, h/i   PLAN: Chest pain: 48 year old female, heavy smoker, history of hypertension, no family history of CAD presents with chest pain a "tension" around the neck for 2 days. EKG showed no acute changes.  Symptoms are nevertheless concerning for a cardiovascular etiology. CAD?  PE?  Others?. Recommend ER evaluation for work-up, spoke with the ER physician who agreed.  Appreciate her help. The patient is agreeable with the plan, one  of my nurses work for down to the ER.

## 2018-03-08 NOTE — Progress Notes (Signed)
Pre visit review using our clinic review tool, if applicable. No additional management support is needed unless otherwise documented below in the visit note. 

## 2018-03-08 NOTE — Patient Instructions (Signed)
Please go to the ER  

## 2018-03-08 NOTE — ED Provider Notes (Signed)
[facility] EMERGENCY DEPARTMENT Provider Note   CSN: [id number] Arrival date & time: 03/08/18  1208    History   Chief Complaint Chief Complaint  Patient presents with  . Chest Pain    HPI Chelsea [name] is a 48 y.o. female.  HPI: A 48 year old patient with a history of hypertension and hypercholesterolemia presents for evaluation of chest pain. Initial onset of pain was less than one hour ago. The patient's chest pain is sharp and is not worse with exertion. The patient's chest pain is middle- or left-sided, is not well-localized, is not described as heaviness/pressure/tightness and does not radiate to the arms/jaw/neck. The patient does not complain of nausea and denies diaphoresis. The patient has smoked in the past 90 days. The patient has no history of stroke, has no history of peripheral artery disease, denies any history of treated diabetes, has no relevant family history of coronary artery disease (first degree relative at less than age 48) and does not have an elevated BMI (>=30).   48 year old female with past medical history including bipolar disorder, hypertension, hyperlipidemia, eczema who presents with chest pain.  2 days ago in the evening, she began having tension and pain in her shoulders and neck.  She then noticed some central to left-sided chest discomfort.  She got in the shower and when she raised her left arm, she had a sharp worsening of the pain.  It eventually subsided and she felt fine until yesterday evening when the pain recurred again.  Like the first time, pain spontaneously resolved.  She has had no pain today.  No associated shortness of breath, nausea, vomiting, or diaphoresis.  Pain was nonexertional.  No associated leg swelling or pain, recent travel, history of blood clots, OCP use, or history of cancer.  She saw her PCP today for a routine visit and when she reported the chest pain, he sent her here for further evaluation.  Currently she denies  any complaints.  She has had no recent illness including no cough/cold symptoms, fevers, or URI symptoms.  Family history negative for heart disease.  She smokes, denies any drug use.  Occasional alcohol use.  The history is provided by the patient.  Chest Pain    Past Medical History:  Diagnosis Date  . Allergy   . Bipolar disorder (HCC)   . Depression   . Eczema   . Elevated cholesterol   . Hypertension   . Schizophrenia [facility](HCC)     Patient Active Problem List   Diagnosis Date Noted  . Annual physical exam 11/29/2017  . MDD (major depressive disorder) 11/07/2017  . PCP NOTES >>>>>>>>>>>>>>>>>>>> 11/28/2016  . Eczema of both upper extremities 10/16/2016  . Essential hypertension 10/16/2016  . Smoker 01/12/2016  . Leukocytosis 10/04/2013  . Hyperkalemia 10/04/2013  . Serum ammonia increased (HCC) 10/04/2013  . ALLERGIC RHINITIS 10/26/2008  . HYPERLIPIDEMIA 01/18/2008  . Schizoaffective disorder, bipolar type (HCC) 01/16/2008    Past Surgical History:  Procedure Laterality Date  . NO PAST SURGERIES       OB History    Gravida  0   Para  0   Term  0   Preterm  0   AB  0   Living  0     SAB  0   TAB  0   Ectopic  0   Multiple  0   Live Births  0            Home Medications  Prior to Admission medications   Medication Sig Start Date End Date Taking? Authorizing Provider  amLODipine (NORVASC) 5 MG tablet Take 1 tablet (5 mg total) by mouth daily. For high blood pressure 11/14/17   Nwoko, Nicole Kindred I, NP  atorvastatin (LIPITOR) 20 MG tablet Take 1 tablet (20 mg total) by mouth at bedtime. For high cholesterol 11/14/17   Armandina Stammer I, NP  benztropine (COGENTIN) 0.5 MG tablet Take 1 tablet (0.5 mg total) by mouth 2 (two) times daily. For prevention of drug induced tremors 11/14/17   Armandina Stammer I, NP  cariprazine (VRAYLAR) capsule Take 1 capsule (3 mg total) by mouth daily. For depression 11/15/17   Armandina Stammer I, NP  cloNIDine (CATAPRES) 0.1 MG tablet  Take 1 tablet (0.1 mg total) by mouth 2 (two) times daily. For high blood pressure 11/14/17   Armandina Stammer I, NP  nicotine polacrilex (NICORETTE) 2 MG gum Take 1 each (2 mg total) by mouth as needed for smoking cessation. (May but from over the counter): For smoking cessation 11/14/17   Armandina Stammer I, NP  risperiDONE (RISPERDAL) 2 MG tablet Take 1 tablet (2 mg total) by mouth 2 (two) times daily. For mood control 11/14/17   Armandina Stammer I, NP  temazepam (RESTORIL) 22.5 MG capsule Take 2 capsules (45 mg total) by mouth at bedtime. For sleep 11/14/17   Sanjuana Kava, NP    Family History Family History  Problem Relation Age of Onset  . Breast cancer Mother   . Hypertension Father   . Diabetes Paternal Grandmother   . Colon cancer Neg Hx   . CAD Neg Hx     Social History Social History   Tobacco Use  . Smoking status: Current Every Day Smoker    Packs/day: 1.50    Types: Cigarettes  . Smokeless tobacco: Never Used  Substance Use Topics  . Alcohol use: Yes    Frequency: Never    Comment:  socially , rare   . Drug use: No     Allergies   Divalproex sodium er; Aspirin; and Fish allergy   Review of Systems Review of Systems  Cardiovascular: Positive for chest pain.     Physical Exam Updated Vital Signs BP (!) 139/92   Pulse 80   Temp 98.6 F (37 C) (Oral)   Resp 20   Ht 5\' 6"  (1.676 m)   Wt 88 kg   LMP 03/01/2018 (Approximate)   SpO2 99%   BMI 31.31 kg/m   Physical Exam Vitals signs and nursing note reviewed.  Constitutional:      General: She is not in acute distress.    Appearance: She is well-developed.  HENT:     Head: Normocephalic and atraumatic.  Eyes:     Conjunctiva/sclera: Conjunctivae normal.  Neck:     Musculoskeletal: Neck supple.  Cardiovascular:     Rate and Rhythm: Normal rate and regular rhythm.     Heart sounds: Normal heart sounds. No murmur.  Pulmonary:     Effort: Pulmonary effort is normal.     Breath sounds: Normal breath sounds.    Abdominal:     General: Bowel sounds are normal. There is no distension.     Palpations: Abdomen is soft.     Tenderness: There is no abdominal tenderness.  Musculoskeletal:     Right lower leg: No edema.     Left lower leg: No edema.     Comments: No chest wall tenderness  Skin:  General: Skin is warm and dry.  Neurological:     Mental Status: She is alert and oriented to person, place, and time.     Comments: Fluent speech  Psychiatric:        Mood and Affect: Mood normal.        Judgment: Judgment normal.      ED Treatments / Results  Labs (all labs ordered are listed, but only abnormal results are displayed) Labs Reviewed  COMPREHENSIVE METABOLIC PANEL - Abnormal; Notable for the following components:      Result Value   Sodium 134 (*)    All other components within normal limits  CBC  TROPONIN I  PREGNANCY, URINE    EKG EKG Interpretation  Date/Time:  Friday March 08 2018 12:13:45 EST Ventricular Rate:  84 PR Interval:    QRS Duration: 90 QT Interval:  364 QTC Calculation: 431 R Axis:   23 Text Interpretation:  Sinus rhythm Borderline short PR interval Borderline T wave abnormalities T wave inversion III, no previous tracing Confirmed by Frederick Peers [phone]) on 03/08/2018 12:20:34 PM   Radiology Dg Chest 2 View  Result Date: 03/08/2018 CLINICAL DATA:  Intermittent chest pain. EXAM: CHEST - 2 VIEW COMPARISON:  No recent prior. FINDINGS: Mediastinum and hilar structures normal. Lungs are clear. No pleural effusion pneumothorax. Heart size normal. IMPRESSION: No acute cardiopulmonary disease. Electronically Signed   By: Maisie Fus  Register   On: 03/08/2018 13:18    Procedures Procedures (including critical care time)  Medications Ordered in ED Medications - No data to display   Initial Impression / Assessment and Plan / ED Course  I have reviewed the triage vital signs and the nursing notes.  Pertinent labs & imaging results that were available  during my care of the patient were reviewed by me and considered in my medical decision making (see chart for details).     HEAR Score: 4  Well appearing, comfortable on exam, very mildly hypertensive. EKG with isolated T wave inversion in III without other abnormalities. Pt has had no chest pain in 24 hours. CXR negative. Trop normal, screening labs reassuring. PERC negative and VS reassuring therefore PE unlikely. Although her HEAR score is 4, I feel her description is very atypical for ACS especially given that her first episode of pain was provoked by raising L arm above head. I discussed options with her including admission for further cardiac testing based on HEAR score vs discharge with close PCP f/u.  She voiced understanding of risks and benefits of both options and wants to go home with PCP follow-up.  She understands that she must immediately return if she has a recurrence of symptoms or any new symptoms.  Final Clinical Impressions(s) / ED Diagnoses   Final diagnoses:  None    ED Discharge Orders    None       Calvin Chura, Ambrose Finland, MD 03/08/18 1407

## 2018-03-08 NOTE — ED Triage Notes (Signed)
Pt states she had CP 2 days ago-denies at present-NAD-steady gait

## 2018-03-08 NOTE — ED Notes (Signed)
Pt on cardiac monitor and auto VS 

## 2018-03-09 NOTE — Assessment & Plan Note (Signed)
Chest pain: 48 year old female, heavy smoker, history of hypertension, no family history of CAD presents with chest pain a "tension" around the neck for 2 days. EKG showed no acute changes.  Symptoms are nevertheless concerning for a cardiovascular etiology. CAD?  PE?  Others?. Recommend ER evaluation for work-up, spoke with the ER physician who agreed.  Appreciate her help. The patient is agreeable with the plan, one  of my nurses work for down to the ER.

## 2018-03-11 ENCOUNTER — Telehealth: Payer: Self-pay

## 2018-03-11 NOTE — Telephone Encounter (Signed)
Please call Pt- needs ED f/u this week. Thank you.

## 2018-03-12 NOTE — Telephone Encounter (Signed)
Appt scheduled 03/14/2018.

## 2018-03-14 ENCOUNTER — Encounter: Payer: Self-pay | Admitting: Internal Medicine

## 2018-03-14 ENCOUNTER — Ambulatory Visit (INDEPENDENT_AMBULATORY_CARE_PROVIDER_SITE_OTHER): Payer: Medicare Other | Admitting: Internal Medicine

## 2018-03-14 VITALS — BP 126/68 | HR 68 | Temp 98.0°F | Resp 16 | Ht 66.0 in | Wt 196.1 lb

## 2018-03-14 DIAGNOSIS — I10 Essential (primary) hypertension: Secondary | ICD-10-CM | POA: Diagnosis not present

## 2018-03-14 DIAGNOSIS — F17200 Nicotine dependence, unspecified, uncomplicated: Secondary | ICD-10-CM

## 2018-03-14 DIAGNOSIS — R079 Chest pain, unspecified: Secondary | ICD-10-CM | POA: Diagnosis not present

## 2018-03-14 DIAGNOSIS — F250 Schizoaffective disorder, bipolar type: Secondary | ICD-10-CM

## 2018-03-14 NOTE — Assessment & Plan Note (Signed)
Chest pain: Work-up by the ER was negative, resolved. MSK? Related to arm movement?  No further intervention at this time.  Knows to call if symptoms increase HTN: Seems controlled, continue amlodipine, clonidine. Weight gain: Patient suspect these related to bipolar medications, that is possible but recommend strongly not to stop them unless she d/w her psychiatrist. We had a long discussion about eating healthy and start a gradual exercise program. Tobacco abuse: Encouraged to think about quitting, see AVS. RTC scheduled for CPX in June

## 2018-03-14 NOTE — Progress Notes (Signed)
Subjective:    Patient ID: Chelsea [name], female    DOB: Sep 24, 1970, 48 y.o.   MRN: [id number]  DOS:  03/14/2018 Type of visit - description: ER follow-up The patient was seen at the office few days ago with chest pain, was referred to the ER, at the ER a CMP was okay, CBC normal, troponin negative, pregnancy test negative, chest x-ray negative. She was offered an admission but at the end she was felt to be stable,  referred back to me.   Review of Systems  Since the ER visit, she is back to normal.  Denies further chest pain or shoulder discomfort. No difficulty breathing, lower extremity edema or palpitations. She is concerned about weight gain.  Past Medical History:  Diagnosis Date  . Allergy   . Bipolar disorder (HCC)   . Depression   . Eczema   . Elevated cholesterol   . Hypertension   . Schizophrenia [facility])     Past Surgical History:  Procedure Laterality Date  . NO PAST SURGERIES      Social History   Socioeconomic History  . Marital status: Divorced    Spouse name: Not on file  . Number of children: Not on file  . Years of education: Not on file  . Highest education level: Not on file  Occupational History  . Occupation: disability   Social Needs  . Financial resource strain: Not on file  . Food insecurity:    Worry: Not on file    Inability: Not on file  . Transportation needs:    Medical: Not on file    Non-medical: Not on file  Tobacco Use  . Smoking status: Current Every Day Smoker    Packs/day: 1.50    Types: Cigarettes  . Smokeless tobacco: Never Used  Substance and Sexual Activity  . Alcohol use: Yes    Frequency: Never    Comment:  socially , rare   . Drug use: No  . Sexual activity: Not Currently  Lifestyle  . Physical activity:    Days per week: Not on file    Minutes per session: Not on file  . Stress: Not on file  Relationships  . Social connections:    Talks on phone: Not on file    Gets together: Not on file    Attends  religious service: Not on file    Active member of club or organization: Not on file    Attends meetings of clubs or organizations: Not on file    Relationship status: Not on file  . Intimate partner violence:    Fear of current or ex partner: Not on file    Emotionally abused: Not on file    Physically abused: Not on file    Forced sexual activity: Not on file  Other Topics Concern  . Not on file  Social History Narrative   [name]   Lives w/ parents    Father Chelsea [name]       Allergies as of 03/14/2018      Reactions   Divalproex Sodium Er Other (See Comments)   Delirium with hyperammonemia   Aspirin    REACTION: swelling   Fish Allergy       Medication List       Accurate as of March 14, 2018 11:11 AM. Always use your most recent med list.        amLODipine 5 MG tablet Commonly known as:  NORVASC Take 1 tablet (5 mg total) by  mouth daily. For high blood pressure   atorvastatin 20 MG tablet Commonly known as:  LIPITOR Take 1 tablet (20 mg total) by mouth at bedtime. For high cholesterol   benztropine 0.5 MG tablet Commonly known as:  COGENTIN Take 1 tablet (0.5 mg total) by mouth 2 (two) times daily. For prevention of drug induced tremors   cariprazine capsule Commonly known as:  VRAYLAR Take 1 capsule (3 mg total) by mouth daily. For depression   cloNIDine 0.1 MG tablet Commonly known as:  CATAPRES Take 1 tablet (0.1 mg total) by mouth 2 (two) times daily. For high blood pressure   nicotine polacrilex 2 MG gum Commonly known as:  NICORETTE Take 1 each (2 mg total) by mouth as needed for smoking cessation. (May but from over the counter): For smoking cessation   risperiDONE 2 MG tablet Commonly known as:  RISPERDAL Take 1 tablet (2 mg total) by mouth 2 (two) times daily. For mood control   temazepam 22.5 MG capsule Commonly known as:  RESTORIL Take 2 capsules (45 mg total) by mouth at bedtime. For sleep           Objective:   Physical Exam BP  126/68 (BP Location: Left Arm, Patient Position: Sitting, Cuff Size: Small)   Pulse 68   Temp 98 F (36.7 C) (Oral)   Resp 16   Ht 5\' 6"  (1.676 m)   Wt 196 lb 2 oz (89 kg)   LMP 03/01/2018 (Approximate)   SpO2 96%   BMI 31.66 kg/m  General:   Well developed, NAD, BMI noted. HEENT:  Normocephalic . Face symmetric, atraumatic Lungs:  CTA B Normal respiratory effort, no intercostal retractions, no accessory muscle use. Heart: RRR,  no murmur.  No pretibial edema bilaterally  Skin: Not pale. Not jaundice Neurologic:  alert & oriented X3.  Speech normal, gait appropriate for age and unassisted Psych--  Cognition and judgment appear intact.  Cooperative with normal attention span and concentration.  Behavior appropriate. No anxious or depressed appearing.      Assessment     Assessment HTN Hyperlipidemia Bipolar disorder; schizophrenia (dx as teenager); seen in [place] Eczema. [facility]) Tobacco abuse  Admitted 11-2017 s/i, h/i   PLAN: Chest pain: Work-up by the ER was negative, resolved. MSK? Related to arm movement?  No further intervention at this time.  Knows to call if symptoms increase HTN: Seems controlled, continue amlodipine, clonidine. Weight gain: Patient suspect these related to bipolar medications, that is possible but recommend strongly not to stop them unless she d/w her psychiatrist. We had a long discussion about eating healthy and start a gradual exercise program. Tobacco abuse: Encouraged to think about quitting, see AVS. RTC scheduled for CPX in June  Chest pain: 48 year old female, heavy smoker, history of hypertension, no family history of CAD presents with chest pain a "tension" around the neck for 2 days. EKG showed no acute changes.  Symptoms are nevertheless concerning for a cardiovascular etiology. CAD?  PE?  Others?. Recommend ER evaluation for work-up, spoke with the ER physician who agreed.  Appreciate her help. The patient is agreeable  with the plan, one  of my nurses work for down to the ER.

## 2018-03-14 NOTE — Patient Instructions (Signed)
GENERAL INFORMATION ABOUT HEALTHY EATING, CHOLESTEROL, HIGH BLOOD PRESSURE, QUIT TOBACCO (if you smoke):  The American Heart Association, www.heart.org  Check the "Life's Simple 7" from the [facility] The American diabetes Association www.diabetes.org  "[facility] A to Z Health Guide" book

## 2018-03-14 NOTE — Progress Notes (Signed)
Pre visit review using our clinic review tool, if applicable. No additional management support is needed unless otherwise documented below in the visit note. 

## 2018-03-15 NOTE — Telephone Encounter (Signed)
Patient informed paperwork ready for p/u during regular business hours; understood & agreed/SLS 03/06

## 2018-03-18 DIAGNOSIS — F411 Generalized anxiety disorder: Secondary | ICD-10-CM | POA: Diagnosis not present

## 2018-03-25 ENCOUNTER — Other Ambulatory Visit: Payer: Self-pay

## 2018-03-25 DIAGNOSIS — G4700 Insomnia, unspecified: Secondary | ICD-10-CM | POA: Diagnosis not present

## 2018-03-25 DIAGNOSIS — F411 Generalized anxiety disorder: Secondary | ICD-10-CM | POA: Diagnosis not present

## 2018-03-25 MED ORDER — AMLODIPINE BESYLATE 5 MG PO TABS: 5.0000 mg | ORAL_TABLET | Freq: Every day | ORAL | 1 refills | Status: DC

## 2018-04-29 DIAGNOSIS — F411 Generalized anxiety disorder: Secondary | ICD-10-CM | POA: Diagnosis not present

## 2018-05-09 ENCOUNTER — Telehealth: Payer: Self-pay | Admitting: Internal Medicine

## 2018-05-09 NOTE — Telephone Encounter (Signed)
Copied from CRM [id number]. Topic: Quick Communication - Rx Refill/Question >> May 09, 2018  8:37 AM Crist Infante wrote: Medication:  atorvastatin (LIPITOR) 20 MG tablet Pt called pharmacy for this refill, but I do not see this has been refilled by Dr Drue Novel in a long time, and no refills were on it.  Pt states she takes this med everyday, but out for a week. Please advise if this is something pt is supposed to be taking. [facility] [address] [place], [place] - [address] [phone] (Phone) [phone] (Fax)

## 2018-05-10 MED ORDER — ATORVASTATIN CALCIUM 20 MG PO TABS: 20.0000 mg | ORAL_TABLET | Freq: Every day | ORAL | 1 refills | Status: DC

## 2018-05-10 NOTE — Telephone Encounter (Signed)
Refills sent

## 2018-05-14 DIAGNOSIS — F411 Generalized anxiety disorder: Secondary | ICD-10-CM | POA: Diagnosis not present

## 2018-05-21 DIAGNOSIS — G4700 Insomnia, unspecified: Secondary | ICD-10-CM | POA: Diagnosis not present

## 2018-05-21 DIAGNOSIS — F411 Generalized anxiety disorder: Secondary | ICD-10-CM | POA: Diagnosis not present

## 2018-05-28 DIAGNOSIS — F411 Generalized anxiety disorder: Secondary | ICD-10-CM | POA: Diagnosis not present

## 2018-05-29 ENCOUNTER — Encounter: Payer: Self-pay | Admitting: Internal Medicine

## 2018-06-17 ENCOUNTER — Encounter: Payer: Self-pay | Admitting: Internal Medicine

## 2018-06-20 DIAGNOSIS — F411 Generalized anxiety disorder: Secondary | ICD-10-CM | POA: Diagnosis not present

## 2018-06-25 DIAGNOSIS — F411 Generalized anxiety disorder: Secondary | ICD-10-CM | POA: Diagnosis not present

## 2018-07-09 ENCOUNTER — Ambulatory Visit (INDEPENDENT_AMBULATORY_CARE_PROVIDER_SITE_OTHER): Payer: Medicare Other | Admitting: Internal Medicine

## 2018-07-09 ENCOUNTER — Other Ambulatory Visit: Payer: Self-pay

## 2018-07-09 ENCOUNTER — Encounter: Payer: Self-pay | Admitting: Internal Medicine

## 2018-07-09 VITALS — BP 143/94 | HR 90 | Temp 97.7°F | Resp 16 | Ht 66.0 in | Wt 180.1 lb

## 2018-07-09 DIAGNOSIS — E785 Hyperlipidemia, unspecified: Secondary | ICD-10-CM

## 2018-07-09 DIAGNOSIS — I10 Essential (primary) hypertension: Secondary | ICD-10-CM | POA: Diagnosis not present

## 2018-07-09 DIAGNOSIS — R739 Hyperglycemia, unspecified: Secondary | ICD-10-CM

## 2018-07-09 DIAGNOSIS — Z1272 Encounter for screening for malignant neoplasm of vagina: Secondary | ICD-10-CM

## 2018-07-09 DIAGNOSIS — Z1231 Encounter for screening mammogram for malignant neoplasm of breast: Secondary | ICD-10-CM

## 2018-07-09 LAB — LIPID PANEL
Cholesterol: 203 mg/dL — ABNORMAL HIGH (ref 0–200)
HDL: 46.6 mg/dL (ref >39.00)
LDL Cholesterol: 127 mg/dL — ABNORMAL HIGH (ref 0–99)
NonHDL: 156.87
Total CHOL/HDL Ratio: 4
Triglycerides: 147 mg/dL (ref 0.0–149.0)
VLDL: 29.4 mg/dL (ref 0.0–40.0)

## 2018-07-09 LAB — TSH: TSH: 1.64 u[IU]/mL (ref 0.35–4.50)

## 2018-07-09 NOTE — Patient Instructions (Addendum)
Get the blood work     [place] Schedule your next appointment for a check up in 6 months       Check the  blood pressure 2   times a  week GOAL: is between 110/65 and  135/85. If it is consistently higher or lower, let me know

## 2018-07-09 NOTE — Progress Notes (Signed)
Subjective:    Patient ID: Chelsea [name], female    DOB: 1970-04-04, 48 y.o.   MRN: [id number]  DOS:  07/09/2018 Type of visit - description: rov High cholesterol: On Lipitor.  Good compliance HTN: BP is slightly elevated today, no recent ambulatory BPs Tobacco: Still smoking, about a pack a day, not ready to quit Weight gain: Improved after psychiatry adjusted her medications.  BP Readings from Last 3 Encounters:  07/09/18 (!) 143/94  03/14/18 126/68  03/08/18 (!) 151/100   Wt Readings from Last 3 Encounters:  07/09/18 180 lb 2 oz (81.7 kg)  03/14/18 196 lb 2 oz (89 kg)  03/08/18 194 lb (88 kg)     Review of Systems Denies fever chills No chest pain no difficulty breathing No nausea, vomiting, diarrhea No dysuria or gross hematuria  Past Medical History:  Diagnosis Date  . Allergy   . Bipolar disorder ([place])   . Depression   . Eczema   . Elevated cholesterol   . Hypertension   . Schizophrenia [facility])     Past Surgical History:  Procedure Laterality Date  . NO PAST SURGERIES      Social History   Socioeconomic History  . Marital status: Divorced    Spouse name: Not on file  . Number of children: Not on file  . Years of education: Not on file  . Highest education level: Not on file  Occupational History  . Occupation: disability   Social Needs  . Financial resource strain: Not on file  . Food insecurity    Worry: Not on file    Inability: Not on file  . Transportation needs    Medical: Not on file    Non-medical: Not on file  Tobacco Use  . Smoking status: Current Every Day Smoker    Packs/day: 1.50    Types: Cigarettes  . Smokeless tobacco: Never Used  Substance and Sexual Activity  . Alcohol use: Yes    Frequency: Never    Comment:  socially , rare   . Drug use: No  . Sexual activity: Not Currently  Lifestyle  . Physical activity    Days per week: Not on file    Minutes per session: Not on file  . Stress: Not on file  Relationships  .  Social Herbalist on phone: Not on file    Gets together: Not on file    Attends religious service: Not on file    Active member of club or organization: Not on file    Attends meetings of clubs or organizations: Not on file    Relationship status: Not on file  . Intimate partner violence    Fear of current or ex partner: Not on file    Emotionally abused: Not on file    Physically abused: Not on file    Forced sexual activity: Not on file  Other Topics Concern  . Not on file  Social History Narrative   [name]   Lives w/ parents    Father Una Yeomans       Allergies as of 07/09/2018      Reactions   Divalproex Sodium Er Other (See Comments)   Delirium with hyperammonemia   Aspirin    REACTION: swelling   Fish Allergy       Medication List       Accurate as of July 09, 2018 11:24 AM. If you have any questions, ask your nurse or doctor.  STOP taking these medications   risperiDONE 2 MG tablet Commonly known as: RISPERDAL Stopped by: Willow OraJose Paz, MD     TAKE these medications   amLODipine 5 MG tablet Commonly known as: NORVASC Take 1 tablet (5 mg total) by mouth daily.   atorvastatin 20 MG tablet Commonly known as: LIPITOR Take 1 tablet (20 mg total) by mouth at bedtime.   benztropine 0.5 MG tablet Commonly known as: COGENTIN Take 1 tablet (0.5 mg total) by mouth 2 (two) times daily. For prevention of drug induced tremors   cariprazine capsule Commonly known as: VRAYLAR Take 1 capsule (3 mg total) by mouth daily. For depression   cloNIDine 0.1 MG tablet Commonly known as: CATAPRES Take 1 tablet (0.1 mg total) by mouth 2 (two) times daily. For high blood pressure   nicotine polacrilex 2 MG gum Commonly known as: NICORETTE Take 1 each (2 mg total) by mouth as needed for smoking cessation. (May but from over the counter): For smoking cessation   temazepam 22.5 MG capsule Commonly known as: RESTORIL Take 2 capsules (45 mg total) by mouth at  bedtime. For sleep           Objective:   Physical Exam BP (!) 143/94 (BP Location: Left Arm, Patient Position: Sitting, Cuff Size: Small)   Pulse 90   Temp 97.7 F (36.5 C) (Oral)   Resp 16   Ht 5\' 6"  (1.676 m)   Wt 180 lb 2 oz (81.7 kg)   SpO2 98%   BMI 29.07 kg/m  General:   Well developed, NAD, BMI noted.  HEENT:  Normocephalic . Face symmetric, atraumatic Neck: No thyromegaly Lungs:  CTA B Normal respiratory effort, no intercostal retractions, no accessory muscle use. Heart: RRR,  no murmur.  no pretibial edema bilaterally  Abdomen:  Not distended, soft, non-tender. No rebound or rigidity.   Skin: Not pale. Not jaundice Neurologic:  alert & oriented X3.  Speech normal, gait appropriate for age and unassisted Psych--  Cognition and judgment appear intact.  Cooperative with normal attention span and concentration.  Behavior appropriate. No anxious or depressed appearing.     Assessment    Assessment HTN Hyperlipidemia Bipolar disorder; schizophrenia (dx as teenager); seen in [place] [facility] Tobacco abuse  Admitted 11-2017 s/i, h/i   PLAN: HTN: Currently on Amlodipine, clonidine.  BP today slightly elevated, recommend ambulatory BPs, reassess in 6 months Hyperlipidemia: On Lipitor, checking a FLP, recent LFTs negative Weight gain: Much improved after psychiatry meds adjusted. Tobacco abuse: Not ready to quit. Preventive care reviewed RTC 6 months

## 2018-07-09 NOTE — Assessment & Plan Note (Addendum)
Preventive care reviewed: - declined all vaccines ' won't take a flu shot, risks discussed  - PAP 01/2016 per [facility]; refer to gyn; MMG ordered  -never had a cscope, discuss on RTC

## 2018-07-09 NOTE — Progress Notes (Signed)
Pre visit review using our clinic review tool, if applicable. No additional management support is needed unless otherwise documented below in the visit note. 

## 2018-07-09 NOTE — Assessment & Plan Note (Signed)
HTN: Currently on Amlodipine, clonidine.  BP today slightly elevated, recommend ambulatory BPs, reassess in 6 months Hyperlipidemia: On Lipitor, checking a FLP, recent LFTs negative Weight gain: Much improved after psychiatry meds adjusted. Tobacco abuse: Not ready to quit. Preventive care reviewed RTC 6 months

## 2018-07-13 ENCOUNTER — Other Ambulatory Visit: Payer: Self-pay

## 2018-07-13 ENCOUNTER — Emergency Department (HOSPITAL_COMMUNITY)
Admission: EM | Admit: 2018-07-13 | Discharge: 2018-07-13 | Disposition: A | Discharge status: Home / Self Care | Payer: Medicare HMO | Attending: Emergency Medicine | Admitting: Emergency Medicine

## 2018-07-13 ENCOUNTER — Encounter (HOSPITAL_COMMUNITY): Payer: Self-pay

## 2018-07-13 DIAGNOSIS — F419 Anxiety disorder, unspecified: Secondary | ICD-10-CM | POA: Insufficient documentation

## 2018-07-13 DIAGNOSIS — F17210 Nicotine dependence, cigarettes, uncomplicated: Secondary | ICD-10-CM | POA: Diagnosis not present

## 2018-07-13 DIAGNOSIS — Z79899 Other long term (current) drug therapy: Secondary | ICD-10-CM | POA: Insufficient documentation

## 2018-07-13 DIAGNOSIS — I10 Essential (primary) hypertension: Secondary | ICD-10-CM | POA: Insufficient documentation

## 2018-07-13 DIAGNOSIS — G4700 Insomnia, unspecified: Secondary | ICD-10-CM | POA: Diagnosis not present

## 2018-07-13 MED ORDER — TRAZODONE HCL 50 MG PO TABS: 50.0000 mg | ORAL_TABLET | Freq: Every day | ORAL | 0 refills | Status: DC

## 2018-07-13 NOTE — ED Provider Notes (Signed)
[facility] DEPT Provider Note   CSN: [id number] Arrival date & time: 07/13/18  1047    History   Chief Complaint Chief Complaint  Patient presents with  . Medical Clearance  . Anxiety  . Insomnia    HPI Chelsea [name] is a 48 y.o. female with a PMHx of schizoaffective, bipolar disorder who presents to the ED with c/o increased anxiety and new insomnia. Patient states that recently her psychiatrist took her off Risperdal due to weight gain and breast discharge. Since that change in medication, she has been feeling increasingly anxious and has not slept in over a day. She denies visual hallucinations, SI and HI. She denies any c/o of pain, headache, dizziness. At this time, she only requests help for her sleep or anxiety.   I spoke to [place] mother over the phone, who reports Risperdal and a new medication were neither working very well, but she confirmed the medications were stopped. She says the meds were stopped for the weight gain and that has improved. Since then, Chelsea [name] has been laughing and talking when alone in the room, increasingly smoking cigarettes (at 3 packs per day now) and has been in a bad mood. Mom was hoping patient can be admitted at this time.   The history is provided by the patient and a relative.    Past Medical History:  Diagnosis Date  . Allergy   . Bipolar disorder ([place])   . Depression   . Eczema   . Elevated cholesterol   . Hypertension   . Schizophrenia [facility])     Patient Active Problem List   Diagnosis Date Noted  . Annual physical exam 11/29/2017  . MDD (major depressive disorder) 11/07/2017  . PCP NOTES >>>>>>>>>>>>>>>>>>>> 11/28/2016  . Eczema of both upper extremities 10/16/2016  . Essential hypertension 10/16/2016  . Smoker 01/12/2016  . Leukocytosis 10/04/2013  . Hyperkalemia 10/04/2013  . Serum ammonia increased ([place]) 10/04/2013  . ALLERGIC RHINITIS 10/26/2008  . HYPERLIPIDEMIA 01/18/2008  .  Schizoaffective disorder, bipolar type ([place]) 01/16/2008    Past Surgical History:  Procedure Laterality Date  . NO PAST SURGERIES       OB History    Gravida  0   Para  0   Term  0   Preterm  0   AB  0   Living  0     SAB  0   TAB  0   Ectopic  0   Multiple  0   Live Births  0            Home Medications    Prior to Admission medications   Medication Sig Start Date End Date Taking? Authorizing Provider  amLODipine (NORVASC) 5 MG tablet Take 1 tablet (5 mg total) by mouth daily. 03/25/18   Colon Branch, MD  atorvastatin (LIPITOR) 20 MG tablet Take 1 tablet (20 mg total) by mouth at bedtime. 05/10/18   Colon Branch, MD  benztropine (COGENTIN) 0.5 MG tablet Take 1 tablet (0.5 mg total) by mouth 2 (two) times daily. For prevention of drug induced tremors 11/14/17   Lindell Spar I, NP  cariprazine (VRAYLAR) capsule Take 1 capsule (3 mg total) by mouth daily. For depression 11/15/17   Lindell Spar I, NP  cloNIDine (CATAPRES) 0.1 MG tablet Take 1 tablet (0.1 mg total) by mouth 2 (two) times daily. For high blood pressure 11/14/17   Lindell Spar I, NP  nicotine polacrilex (NICORETTE) 2 MG gum Take 1  each (2 mg total) by mouth as needed for smoking cessation. (May but from over the counter): For smoking cessation 11/14/17   Armandina StammerNwoko, Agnes I, NP  temazepam (RESTORIL) 22.5 MG capsule Take 2 capsules (45 mg total) by mouth at bedtime. For sleep 11/14/17   Armandina StammerNwoko, Agnes I, NP  traZODone (DESYREL) 50 MG tablet Take 1 tablet (50 mg total) by mouth at bedtime. 07/13/18   Verdene LennertBasaraba, Teofil Maniaci, MD    Family History Family History  Problem Relation Age of Onset  . Breast cancer Mother   . Hypertension Father   . Diabetes Paternal Grandmother   . Colon cancer Neg Hx   . CAD Neg Hx     Social History Social History   Tobacco Use  . Smoking status: Current Every Day Smoker    Packs/day: 1.50    Types: Cigarettes  . Smokeless tobacco: Never Used  Substance Use Topics  . Alcohol use: Yes     Frequency: Never    Comment:  socially , rare   . Drug use: No     Allergies   Divalproex sodium er, Aspirin, and Fish allergy   Review of Systems Review of Systems  Neurological: Negative for dizziness and headaches.  Psychiatric/Behavioral: Positive for sleep disturbance. Negative for hallucinations, self-injury and suicidal ideas. The patient is nervous/anxious.   All other systems reviewed and are negative.    Physical Exam Updated Vital Signs BP (!) 132/98   Pulse (!) 114   Temp 98 F (36.7 C) (Oral)   Resp 18   LMP 07/07/2018   SpO2 96%   Physical Exam Constitutional:      General: She is not in acute distress.    Appearance: She is normal weight.  HENT:     Head: Normocephalic and atraumatic.  Pulmonary:     Effort: Pulmonary effort is normal.  Skin:    General: Skin is warm and dry.  Neurological:     General: No focal deficit present.     Mental Status: She is alert.  Psychiatric:        Attention and Perception: Attention normal.        Mood and Affect: Mood is anxious.        Speech: Speech normal. Speech is not slurred.        Behavior: Behavior is cooperative.        Thought Content: Thought content does not include homicidal or suicidal ideation. Thought content does not include homicidal or suicidal plan.        Cognition and Memory: Cognition is not impaired.     Comments: Increased pacing and uneasiness.  Appears competent to make decisions regarding her own medical care.       ED Treatments / Results  Labs (all labs ordered are listed, but only abnormal results are displayed) Labs Reviewed - No data to display  EKG None  Radiology No results found.  Procedures Procedures (including critical care time)  Medications Ordered in ED Medications - No data to display   Initial Impression / Assessment and Plan / ED Course  I have reviewed the triage vital signs and the nursing notes.  Pertinent labs & imaging results that were  available during my care of the patient were reviewed by me and considered in my medical decision making (see chart for details).  Anxiety and Insomnia:  Patient's increased anxiety and insomnia is most likely secondary to recent changes in her medication. Despite patient denying visual or verbal hallucinations, Mom  states patient has been talking and laughing when alone. Likely, she will need a medication re-adjustment by her primary psychatrist. Ms. Arleta Creekieves was offered for a consult to be placed for her medications and mental heath [facility]. She denied this and requested to be sent home. An outpatient prescription for Trazodone 50mg  QHS was sent to help with insomnia. Since she denies SI/HI at this time, and appears competent to make decisions regarding her own health, she is safe to be discharged at this time. She has been informed to please return if any SI/HI develops.     Final Clinical Impressions(s) / ED Diagnoses   Final diagnoses:  Anxiety  Insomnia, unspecified type    ED Discharge Orders         Ordered    traZODone (DESYREL) 50 MG tablet  Daily at bedtime     07/13/18 1240           Verdene LennertBasaraba, Kyjuan Gause, MD 07/13/18 1302    Sabas SousBero, Michael M, MD 07/17/18 1021

## 2018-07-13 NOTE — Discharge Instructions (Signed)
If you develop any thoughts of harming yourself or others, please return to the ED immediately.

## 2018-07-13 NOTE — ED Triage Notes (Addendum)
Patient dropped off by mother.  C/O anxiety and insomnia X2 days.   Patient states her MD took away 2 medications because it was giving her side effects about 2 weeks ago. (Breast discharge).   Patient states the medication was Risperdal and she had been taking it for "months" but unsure exactly how long.   Patient stats she saw her MD on Tuesday and had a physical and blood drawn. Patient states she has not received her results yet.    A/ox4 Ambulatory in triage.    While patient was in triage she would keep coming out of room and standing at door.    Patient states she does not need a Optometrist.

## 2018-07-15 ENCOUNTER — Emergency Department (HOSPITAL_COMMUNITY)
Admission: EM | Admit: 2018-07-15 | Discharge: 2018-07-15 | Disposition: A | Discharge status: Home / Self Care | Payer: Medicare HMO | Attending: Emergency Medicine | Admitting: Emergency Medicine

## 2018-07-15 ENCOUNTER — Encounter: Payer: Self-pay | Admitting: Obstetrics and Gynecology

## 2018-07-15 ENCOUNTER — Encounter (HOSPITAL_COMMUNITY): Payer: Self-pay | Admitting: Registered Nurse

## 2018-07-15 ENCOUNTER — Other Ambulatory Visit: Payer: Self-pay

## 2018-07-15 DIAGNOSIS — G479 Sleep disorder, unspecified: Secondary | ICD-10-CM

## 2018-07-15 DIAGNOSIS — F250 Schizoaffective disorder, bipolar type: Secondary | ICD-10-CM | POA: Diagnosis not present

## 2018-07-15 DIAGNOSIS — G4700 Insomnia, unspecified: Secondary | ICD-10-CM | POA: Diagnosis not present

## 2018-07-15 DIAGNOSIS — Z79899 Other long term (current) drug therapy: Secondary | ICD-10-CM | POA: Diagnosis not present

## 2018-07-15 DIAGNOSIS — F419 Anxiety disorder, unspecified: Secondary | ICD-10-CM | POA: Diagnosis not present

## 2018-07-15 DIAGNOSIS — I10 Essential (primary) hypertension: Secondary | ICD-10-CM | POA: Diagnosis not present

## 2018-07-15 DIAGNOSIS — F329 Major depressive disorder, single episode, unspecified: Secondary | ICD-10-CM | POA: Insufficient documentation

## 2018-07-15 DIAGNOSIS — F17210 Nicotine dependence, cigarettes, uncomplicated: Secondary | ICD-10-CM | POA: Diagnosis not present

## 2018-07-15 LAB — CBC WITH DIFFERENTIAL/PLATELET
Abs Immature Granulocytes: 0.03 10*3/uL (ref 0.00–0.07)
Basophils Absolute: 0 10*3/uL (ref 0.0–0.1)
Basophils Relative: 1 %
Eosinophils Absolute: 0.3 10*3/uL (ref 0.0–0.5)
Eosinophils Relative: 4 %
HCT: 42.6 % (ref 36.0–46.0)
Hemoglobin: 14.1 g/dL (ref 12.0–15.0)
Immature Granulocytes: 0 %
Lymphocytes Relative: 27 %
Lymphs Abs: 2 10*3/uL (ref 0.7–4.0)
MCH: 30.9 pg (ref 26.0–34.0)
MCHC: 33.1 g/dL (ref 30.0–36.0)
MCV: 93.2 fL (ref 80.0–100.0)
Monocytes Absolute: 0.6 10*3/uL (ref 0.1–1.0)
Monocytes Relative: 8 %
Neutro Abs: 4.5 10*3/uL (ref 1.7–7.7)
Neutrophils Relative %: 60 %
Platelets: 322 10*3/uL (ref 150–400)
RBC: 4.57 MIL/uL (ref 3.87–5.11)
RDW: 14 % (ref 11.5–15.5)
WBC: 7.4 10*3/uL (ref 4.0–10.5)
nRBC: 0 % (ref 0.0–0.2)

## 2018-07-15 LAB — RAPID URINE DRUG SCREEN, HOSP PERFORMED
Amphetamines: NOT DETECTED
Barbiturates: NOT DETECTED
Benzodiazepines: NOT DETECTED
Cocaine: NOT DETECTED
Opiates: NOT DETECTED
Tetrahydrocannabinol: NOT DETECTED

## 2018-07-15 LAB — COMPREHENSIVE METABOLIC PANEL
ALT: 40 U/L (ref 0–44)
AST: 29 U/L (ref 15–41)
Albumin: 3.8 g/dL (ref 3.5–5.0)
Alkaline Phosphatase: 91 U/L (ref 38–126)
Anion gap: 8 (ref 5–15)
BUN: 11 mg/dL (ref 6–20)
CO2: 25 mmol/L (ref 22–32)
Calcium: 8.9 mg/dL (ref 8.9–10.3)
Chloride: 103 mmol/L (ref 98–111)
Creatinine, Ser: 0.72 mg/dL (ref 0.44–1.00)
GFR calc Af Amer: >60 mL/min (ref >60)
GFR calc non Af Amer: >60 mL/min (ref >60)
Glucose, Bld: 89 mg/dL (ref 70–99)
Potassium: 3.4 mmol/L — ABNORMAL LOW (ref 3.5–5.1)
Sodium: 136 mmol/L (ref 135–145)
Total Bilirubin: 0.8 mg/dL (ref 0.3–1.2)
Total Protein: 7 g/dL (ref 6.5–8.1)

## 2018-07-15 LAB — SALICYLATE LEVEL: Salicylate Lvl: <7 mg/dL (ref 2.8–30.0)

## 2018-07-15 LAB — ETHANOL: Alcohol, Ethyl (B): <10 mg/dL (ref <10)

## 2018-07-15 LAB — I-STAT BETA HCG BLOOD, ED (MC, WL, AP ONLY): I-stat hCG, quantitative: <5 m[IU]/mL (ref <5)

## 2018-07-15 LAB — ACETAMINOPHEN LEVEL: Acetaminophen (Tylenol), Serum: <10 ug/mL — ABNORMAL LOW (ref 10–30)

## 2018-07-15 NOTE — ED Triage Notes (Signed)
Pt states she is here today because she has not been sleeping and has anxiety.  She came Friday and did not want to be admitted and now she wants to be admitted

## 2018-07-15 NOTE — BHH Suicide Risk Assessment (Addendum)
[facility] Discharge Suicide Risk Assessment   Principal Problem: Schizoaffective disorder, bipolar type [facility]) Discharge Diagnoses: Principal Problem:   Schizoaffective disorder, bipolar type ([place])   Total Time spent with patient: 15 minutes   [name] reports coming to the hospital due to insomnia for the past 2 days. She reports taking an extra tablet of Restoril which made her feel "out of it." She reports sleeping well overnight. She denies SI, HI or AVH. She denies a history of suicide attempts. She reports following up with a nurse practitioner at [facility]. She has a history of schizoaffective disorder. She reports medication compliance. She had recent medication changes and Risperdal was stopped. She reports restarting Risperdal due to feeling worst. She plans to follow up with her outpatient provider regarding these concerns. She was informed of the importance of taking her medications as prescribed. She was last hospitalized this past year. She lives with her parents. She is unemployed. She receives disability. She denies alcohol or illicit drug use. She reports tobacco use. She denies a family psychiatric history.   Musculoskeletal: Strength & Muscle Tone: No atrophy noted. Gait & Station: normal Patient leans: N/A  Psychiatric Specialty Exam: Review of Systems  Psychiatric/Behavioral: Negative for hallucinations, substance abuse and suicidal ideas. The patient has insomnia.   All other systems reviewed and are negative.   Blood pressure 130/90, pulse 76, temperature 98 F (36.7 C), temperature source Oral, resp. rate 20, last menstrual period 07/07/2018, SpO2 98 %.There is no height or weight on file to calculate BMI.  General Appearance: Fairly Groomed, middle aged, Hispanic female, wearing paper hospital scrubs, short hair with corrective lenses who is sitting in bed. NAD.   Eye Contact::  Good  Speech:  Clear and Coherent and Normal Rate  Volume:  Normal  Mood:  Euthymic   Affect:  Appropriate and Congruent  Thought Process:  Goal Directed, Linear and Descriptions of Associations: Intact  Orientation:  Full (Time, Place, and Person)  Thought Content:  Logical  Suicidal Thoughts:  No  Homicidal Thoughts:  No  Memory:  Immediate;   Good Recent;   Good Remote;   Good  Judgement:  Fair  Insight:  Fair  Psychomotor Activity:  Normal  Concentration:  Good  Recall:  Good  Fund of Knowledge:Good  Language: Good  Akathisia:  No  Handed:  Right  AIMS (if indicated):   N/A  Assets:  Communication Skills Desire for Improvement Financial Resources/Insurance Housing Physical Health Resilience Social Support  Sleep:   Fair. Improving.   Cognition: WNL  ADL's:  Intact   Mental Status Per Nursing Assessment::   On Admission:   "Pt states she is here today because she has not been sleeping and has anxiety.  She came Friday and did not want to be admitted and now she wants to be admitted"  Demographic Factors:  NA  Loss Factors: NA  Historical Factors: NA  Risk Reduction Factors:   Living with another person, especially a relative, Positive social support and Positive therapeutic relationship  Continued Clinical Symptoms:  Previous Psychiatric Diagnoses and Treatments  Cognitive Features That Contribute To Risk:  None    Suicide Risk:  Minimal: No identifiable suicidal ideation.  Patients presenting with no risk factors but with morbid ruminations; may be classified as minimal risk based on the severity of the depressive symptoms    Assessment:  Chelsea [name] is a 48 y.o. female who was admitted with insomnia. She reports feeling better today after sleeping well  overnight. She denies SI, HI or AVH. She does not warrant inpatient psychiatric hospitalization at this time.   Plan Of Care/Follow-up recommendations:  -Continue psychotropic medications at this time.  -Patient will follow up with outpatient provider for medication management.   -Discharge home.   Cherly BeachJacqueline J Thora Scherman, DO 07/15/2018, 12:00 PM

## 2018-07-15 NOTE — ED Provider Notes (Signed)
[facility]-EMERGENCY DEPT Provider Note   CSN: [id number] Arrival date & time: 07/15/18  0714    History   Chief Complaint Chief Complaint  Patient presents with  . Insomnia    HPI Chelsea BostonJulissa [name] is a 48 y.o. female with history of bipolar disorder, schizophrenia, hypertension, hyperlipidemia, tobacco abuse presents for evaluation of worsening anxiety.  She reports that for the last 3 days she has not been able to sleep.  She was seen and evaluated in the emergency department on 07/13/2018, declined psychiatric work-up but received trazodone which she states has been helpful.  She reports that it did make her very drowsy however.  Chart review shows that the provider caring for her 2 days ago spoke with the patient's mother who was hoping that the patient could be psychiatrically hospitalized due to abnormal behavior, increase cigarette smoking, dysphoric mood. The patient has reportedly been leaving her home at very early hours of the morning to buy cigarettes, and mother is concerned that she could be putting herself in danger.   The patient tells me that she feels "peaceful "right now but states she would like to be hospitalized.  She denies suicidal ideation or homicidal ideation.  She denies auditory or visual hallucinations.  She denies any medical complaints at this time including fever, shortness of breath, chest pain, abdominal pain, nausea, or vomiting.  She is a current smoker, denies recreational drug use or alcohol intake.     The history is provided by the patient and medical records.    Past Medical History:  Diagnosis Date  . Allergy   . Bipolar disorder (HCC)   . Depression   . Eczema   . Elevated cholesterol   . Hypertension   . Schizophrenia [facility](HCC)     Patient Active Problem List   Diagnosis Date Noted  . Annual physical exam 11/29/2017  . MDD (major depressive disorder) 11/07/2017  . PCP NOTES >>>>>>>>>>>>>>>>>>>> 11/28/2016  . Eczema of  both upper extremities 10/16/2016  . Essential hypertension 10/16/2016  . Smoker 01/12/2016  . Leukocytosis 10/04/2013  . Hyperkalemia 10/04/2013  . Serum ammonia increased (HCC) 10/04/2013  . ALLERGIC RHINITIS 10/26/2008  . HYPERLIPIDEMIA 01/18/2008  . Schizoaffective disorder, bipolar type (HCC) 01/16/2008    Past Surgical History:  Procedure Laterality Date  . NO PAST SURGERIES       OB History    Gravida  0   Para  0   Term  0   Preterm  0   AB  0   Living  0     SAB  0   TAB  0   Ectopic  0   Multiple  0   Live Births  0            Home Medications    Prior to Admission medications   Medication Sig Start Date End Date Taking? Authorizing Provider  amLODipine (NORVASC) 5 MG tablet Take 1 tablet (5 mg total) by mouth daily. 03/25/18  Yes Paz, Nolon RodJose E, MD  atorvastatin (LIPITOR) 20 MG tablet Take 1 tablet (20 mg total) by mouth at bedtime. 05/10/18  Yes Paz, Nolon RodJose E, MD  benztropine (COGENTIN) 0.5 MG tablet Take 1 tablet (0.5 mg total) by mouth 2 (two) times daily. For prevention of drug induced tremors 11/14/17  Yes Nwoko, Nicole KindredAgnes I, NP  cariprazine (VRAYLAR) capsule Take 1 capsule (3 mg total) by mouth daily. For depression 11/15/17  Yes Armandina StammerNwoko, Agnes I, NP  cloNIDine (CATAPRES) 0.1  MG tablet Take 1 tablet (0.1 mg total) by mouth 2 (two) times daily. For high blood pressure 11/14/17  Yes Nwoko, Herbert Pun I, NP  nicotine polacrilex (NICORETTE) 2 MG gum Take 1 each (2 mg total) by mouth as needed for smoking cessation. (May but from over the counter): For smoking cessation Patient not taking: Reported on 07/15/2018 11/14/17   Lindell Spar I, NP  temazepam (RESTORIL) 22.5 MG capsule Take 2 capsules (45 mg total) by mouth at bedtime. For sleep Patient not taking: Reported on 07/15/2018 11/14/17   Lindell Spar I, NP  traZODone (DESYREL) 50 MG tablet Take 1 tablet (50 mg total) by mouth at bedtime. Patient not taking: Reported on 07/15/2018 07/13/18   Jose Persia, MD    Family  History Family History  Problem Relation Age of Onset  . Breast cancer Mother   . Hypertension Father   . Diabetes Paternal Grandmother   . Colon cancer Neg Hx   . CAD Neg Hx     Social History Social History   Tobacco Use  . Smoking status: Current Every Day Smoker    Packs/day: 1.50    Types: Cigarettes  . Smokeless tobacco: Never Used  Substance Use Topics  . Alcohol use: Yes    Frequency: Never    Comment:  socially , rare   . Drug use: No     Allergies   Divalproex sodium er, Aspirin, and Fish allergy   Review of Systems Review of Systems  Constitutional: Negative for chills and fever.  Respiratory: Negative for shortness of breath.   Cardiovascular: Negative for chest pain.  Gastrointestinal: Negative for abdominal pain, nausea and vomiting.  Psychiatric/Behavioral: Positive for dysphoric mood and sleep disturbance.  All other systems reviewed and are negative.    Physical Exam Updated Vital Signs BP 130/90 (BP Location: Right Arm)   Pulse 76   Temp 98 F (36.7 C) (Oral)   Resp 20   LMP 07/07/2018 (Approximate)   SpO2 98%   Physical Exam Vitals signs and nursing note reviewed.  Constitutional:      General: She is not in acute distress.    Appearance: She is well-developed.  HENT:     Head: Normocephalic and atraumatic.  Eyes:     General:        Right eye: No discharge.        Left eye: No discharge.     Extraocular Movements: Extraocular movements intact.     Conjunctiva/sclera: Conjunctivae normal.     Pupils: Pupils are equal, round, and reactive to light.  Neck:     Musculoskeletal: Normal range of motion and neck supple.     Vascular: No JVD.     Trachea: No tracheal deviation.  Cardiovascular:     Rate and Rhythm: Normal rate and regular rhythm.     Pulses: Normal pulses.  Pulmonary:     Effort: Pulmonary effort is normal.     Breath sounds: Normal breath sounds.  Abdominal:     General: Bowel sounds are normal. There is no  distension.     Palpations: Abdomen is soft.     Tenderness: There is no abdominal tenderness. There is no guarding or rebound.  Skin:    General: Skin is warm and dry.     Findings: No erythema.  Neurological:     General: No focal deficit present.     Mental Status: She is alert and oriented to person, place, and time.  Psychiatric:  Attention and Perception: Attention normal.        Mood and Affect: Mood is depressed.        Behavior: Behavior is cooperative.        Thought Content: Thought content does not include homicidal or suicidal ideation. Thought content does not include homicidal or suicidal plan.      ED Treatments / Results  Labs (all labs ordered are listed, but only abnormal results are displayed) Labs Reviewed  COMPREHENSIVE METABOLIC PANEL - Abnormal; Notable for the following components:      Result Value   Potassium 3.4 (*)    All other components within normal limits  ACETAMINOPHEN LEVEL - Abnormal; Notable for the following components:   Acetaminophen (Tylenol), Serum <10 (*)    All other components within normal limits  ETHANOL  RAPID URINE DRUG SCREEN, HOSP PERFORMED  CBC WITH DIFFERENTIAL/PLATELET  SALICYLATE LEVEL  I-STAT BETA HCG BLOOD, ED ([facility], [facility], [facility] ONLY)    EKG None  Radiology No results found.  Procedures Procedures (including critical care time)  Medications Ordered in ED Medications - No data to display   Initial Impression / Assessment and Plan / ED Course  I have reviewed the triage vital signs and the nursing notes.  Pertinent labs & imaging results that were available during my care of the patient were reviewed by me and considered in my medical decision making (see chart for details).        Patient presents with ongoing difficulty sleeping.  She is afebrile, vital signs are stable.  She is nontoxic in appearance.  Reassuring physical examination.  She was seen for the same 2 days ago and did not want any TTS  evaluation or hospitalization at that time but returns today requesting this.  Lab work reviewed by me shows no leukocytosis, no anemia, no metabolic derangements, no renal insufficiency.  Screening labs overall reassuring.  Pending TTS evaluation.  11:05AM Patient seen and evaluated by psychiatry, recommend continuation of psychotropic medications and outpatient follow-up with provider for medication management.  They feel that she is reasonably stabilized for discharge home and does not warrant inpatient psychiatric hospitalization I think this is reasonable as she is overall well-appearing and does not appear to be an imminent threat to herself or others at this time.  Patient stable for discharge.  Final Clinical Impressions(s) / ED Diagnoses   Final diagnoses:  Difficulty sleeping    ED Discharge Orders    None       Bennye AlmFawze, Dupree Givler A, PA-C 07/15/18 1315    Azalia Bilisampos, Kevin, MD 07/15/18 1601

## 2018-07-15 NOTE — BH Assessment (Signed)
[facility] Assessment Progress Note  Per Buford Dresser, DO, this pt does not require psychiatric hospitalization at this time.  Pt is to be discharged from [facility] with recommendation to continue treatment with Deloria Lair, NP at [place].  This has been included in pt's discharge instructions.  Pt's nurse, Nena Jordan, has been notified.  Jalene Mullet, [place] Triage Specialist [phone]

## 2018-07-15 NOTE — Discharge Instructions (Signed)
For your behavioral health needs, you are advised to continue treatment with Deloria Lair, NP at [facility]:       [address], [place] [address]      [phone]

## 2018-07-15 NOTE — BH Assessment (Addendum)
Assessment Note  Chelsea [name] is an 48 y.o. female that presents this date voluntary requesting assistance with ongoing anxiety and sleep hygiene. Patient denies an S/I, H/I or AVH. Patient is observed to be disorganized at times and renders limited history. Patient is circumstantial and requires prompting to get back on track. Patient states she is currently receiving services from [facility] at [facility] that assists with ongoing symptoms of Schizoaffective disorder. Patient cannot recall the last time she met with that provider and it is unclear what medications she is currently prescribed. Patient states she presents this date due to not sleeping (3 hours or less) for the last three days. Patient denies any SA issues with UDS pending. Patient cannot recall when she was diagnosed with her MH disorder and reports current medication compliance. Patient denies any previous attempts or gestures at self harm. Per chart review patient was last seen on 11/06/17 at [facility] when she presented at that time with IVC for S/I and H/I. Patient is oriented  X 4 and speaks in a low soft voice with decreased concentration requiring prompting to finish questions associated with assessment this date. Per notes, patient denies any MH symptoms beyond lack of sleep and anxiety. Patient stated that she was last hospitalized in February due to the same type of incident, in [place], [place]. Patient denied any history of self harm. Patient denied history of suicide attempts. Patient reports living with her parents. Patient reports receiving SSI and Medicare.  Per notes, patient oriented to person, place, time and situation. Patient has a noted history of  schizophrenia, hypertension, hyperlipidemia, tobacco abuse presents for evaluation of worsening anxiety. She reports that for the last 3 days she has not been able to sleep. She was seen and evaluated in the emergency department on 07/13/2018, declined psychiatric work-up but received  trazodone which she states has been helpful. She reports that it did make her very drowsy however still has problems sleeping. Collateral from patient's mother reports patient has been constantly smoking cigarettes and has been leaving her home at very early hours of the morning to buy cigarettes, and mother is concerned that she could be putting herself in danger. She denies suicidal ideation or homicidal ideation. She denies auditory or visual hallucinations. Case was staffed with Mariea Clonts MD who recommended patient be discharged and follow up with OP provider.    Diagnosis: F25.0 Schizoaffective disorder, Bipolar type  Past Medical History:  Past Medical History:  Diagnosis Date  . Allergy   . Bipolar disorder ([place])   . Depression   . Eczema   . Elevated cholesterol   . Hypertension   . Schizophrenia [facility])     Past Surgical History:  Procedure Laterality Date  . NO PAST SURGERIES      Family History:  Family History  Problem Relation Age of Onset  . Breast cancer Mother   . Hypertension Father   . Diabetes Paternal Grandmother   . Colon cancer Neg Hx   . CAD Neg Hx     Social History:  reports that she has been smoking cigarettes. She has been smoking about 1.50 packs per day. She has never used smokeless tobacco. She reports current alcohol use. She reports that she does not use drugs.  Additional Social History:  Alcohol / Drug Use Pain Medications: See MAR Prescriptions: See MAR Over the Counter: See MAR History of alcohol / drug use?: No history of alcohol / drug abuse  CIWA: CIWA-Ar BP: 122/89 Pulse Rate:  90 COWS:    Allergies:  Allergies  Allergen Reactions  . Divalproex Sodium Er Other (See Comments)    Delirium with hyperammonemia  . Aspirin     REACTION: swelling  . Fish Allergy     Home Medications: (Not in a hospital admission)   OB/GYN Status:  Patient's last menstrual period was 07/07/2018 (approximate).  General Assessment Data [facility] of  Assessment: [facility] TTS Assessment: In system Is this a Tele or Face-to-Face Assessment?: Tele Assessment Is this an Initial Assessment or a Re-assessment for this encounter?: Initial Assessment Patient Accompanied by:: N/A Language Other than English: No Living Arrangements: Other (Comment) What gender do you identify as?: Female Marital status: Single Living Arrangements: Parent Can pt return to current living arrangement?: Yes Admission Status: Voluntary Is patient capable of signing voluntary admission?: Yes Referral Source: Self/Family/Friend Insurance type: Medicare  Medical Screening Exam ([place]) Medical Exam completed: Yes  Crisis Care Plan Living Arrangements: Parent Legal Guardian: (NA) Name of Psychiatrist: [facility] MD Name of Therapist: None  Education Status Is patient currently in school?: No Is the patient employed, unemployed or receiving disability?: Unemployed  Risk to self with the past 6 months Suicidal Ideation: No Has patient been a risk to self within the past 6 months prior to admission? : No Suicidal Intent: No Has patient had any suicidal intent within the past 6 months prior to admission? : No Is patient at risk for suicide?: No Suicidal Plan?: No Has patient had any suicidal plan within the past 6 months prior to admission? : No Access to Means: No What has been your use of drugs/alcohol within the last 12 months?: Denies Previous Attempts/Gestures: No How many times?: 0 Other Self Harm Risks: NA Triggers for Past Attempts: (NA) Intentional Self Injurious Behavior: None Family Suicide History: No Recent stressful life event(s): Other (Comment)(Lack of sleep) Persecutory voices/beliefs?: No Depression: No Depression Symptoms: (No) Substance abuse history and/or treatment for substance abuse?: No Suicide prevention information given to non-admitted patients: Not applicable  Risk to Others within the past 6 months Homicidal  Ideation: No Does patient have any lifetime risk of violence toward others beyond the six months prior to admission? : No Thoughts of Harm to Others: No Current Homicidal Intent: No Current Homicidal Plan: No Access to Homicidal Means: No Identified Victim: NA History of harm to others?: No Assessment of Violence: None Noted Violent Behavior Description: NA Does patient have access to weapons?: No Criminal Charges Pending?: No Does patient have a court date: No Is patient on probation?: No  Psychosis Hallucinations: None noted Delusions: None noted  Mental Status Report Appearance/Hygiene: Unremarkable Eye Contact: Fair Motor Activity: Freedom of movement Speech: Logical/coherent Level of Consciousness: Quiet/awake Mood: Anxious Affect: Appropriate to circumstance Anxiety Level: Minimal Thought Processes: Circumstantial Judgement: Partial Orientation: Person, Place, Time Obsessive Compulsive Thoughts/Behaviors: None  Cognitive Functioning Concentration: Decreased Memory: Recent Intact, Remote Intact Is patient IDD: No Insight: Fair Impulse Control: Fair Appetite: Fair Have you had any weight changes? : No Change Sleep: Decreased Total Hours of Sleep: 3 Vegetative Symptoms: None  ADLScreening [facility] Assessment Services) Patient's cognitive ability adequate to safely complete daily activities?: Yes Patient able to express need for assistance with ADLs?: No Independently performs ADLs?: Yes (appropriate for developmental age)  Prior Inpatient Therapy Prior Inpatient Therapy: Yes Prior Therapy Dates: 2019 Prior Therapy Facilty/Provider(s): [facility] Reason for Treatment: MH issues  Prior Outpatient Therapy Prior Outpatient Therapy: Yes Prior Therapy Dates: Ongoing Prior Therapy Facilty/Provider(s): [facility] MD  Reason for Treatment: Med mang Does patient have an ACCT team?: No Does patient have Intensive In-House Services?  : No Does patient have [facility] services? :  No Does patient have P4CC services?: No  ADL Screening (condition at time of admission) Patient's cognitive ability adequate to safely complete daily activities?: Yes Is the patient deaf or have difficulty hearing?: No Does the patient have difficulty seeing, even when wearing glasses/contacts?: No Does the patient have difficulty concentrating, remembering, or making decisions?: Yes Patient able to express need for assistance with ADLs?: No Does the patient have difficulty dressing or bathing?: No Independently performs ADLs?: Yes (appropriate for developmental age) Does the patient have difficulty walking or climbing stairs?: No Weakness of Legs: None Weakness of Arms/Hands: None  Home Assistive Devices/Equipment Home Assistive Devices/Equipment: None  Therapy Consults (therapy consults require a physician order) PT Evaluation Needed: No OT Evalulation Needed: No SLP Evaluation Needed: No Abuse/Neglect Assessment (Assessment to be complete while patient is alone) Physical Abuse: Denies Verbal Abuse: Denies Sexual Abuse: Denies Exploitation of patient/patient's resources: Denies Self-Neglect: Denies Values / Beliefs Cultural Requests During Hospitalization: None Spiritual Requests During Hospitalization: None Consults Spiritual Care Consult Needed: No Social Work Consult Needed: No Regulatory affairs officer (For Healthcare) Does Patient Have a Medical Advance Directive?: No Would patient like information on creating a medical advance directive?: No - Patient declined          Disposition: Case was staffed with Mariea Clonts MD who recommended patient be discharged and follow up with OP provider.   Disposition Initial Assessment Completed for this Encounter: Yes  On Site Evaluation by:   Reviewed with Physician:    Mamie Nick 07/15/2018 8:50 AM

## 2018-07-21 ENCOUNTER — Telehealth: Payer: Self-pay | Admitting: Internal Medicine

## 2018-07-21 NOTE — Telephone Encounter (Signed)
Please call patient, went to the ER twice with anxiety and insomnia.   Please be sure she has a follow-up with her mental health provider, if she does not, see if we can help her st that up. Office visit with me if necessary.

## 2018-07-22 DIAGNOSIS — F411 Generalized anxiety disorder: Secondary | ICD-10-CM | POA: Diagnosis not present

## 2018-07-22 NOTE — Telephone Encounter (Signed)
LMOM informing Pt to return call. Okay for PEC to discuss.  

## 2018-07-30 ENCOUNTER — Other Ambulatory Visit: Payer: Self-pay

## 2018-07-30 ENCOUNTER — Emergency Department (HOSPITAL_COMMUNITY)
Admission: EM | Admit: 2018-07-30 | Discharge: 2018-07-30 | Discharge status: Against Medical Advice | Payer: Medicare HMO | Attending: Emergency Medicine | Admitting: Emergency Medicine

## 2018-07-30 ENCOUNTER — Encounter (HOSPITAL_COMMUNITY): Payer: Self-pay

## 2018-07-30 DIAGNOSIS — Z5321 Procedure and treatment not carried out due to patient leaving prior to being seen by health care provider: Secondary | ICD-10-CM | POA: Insufficient documentation

## 2018-07-30 DIAGNOSIS — I10 Essential (primary) hypertension: Secondary | ICD-10-CM | POA: Diagnosis present

## 2018-07-30 NOTE — ED Triage Notes (Signed)
Pt states that she went to have a physical and was told her pressure was high and told to double her meds, she said she did and it's still elevated

## 2018-07-31 ENCOUNTER — Ambulatory Visit (INDEPENDENT_AMBULATORY_CARE_PROVIDER_SITE_OTHER): Payer: Medicare HMO | Admitting: Internal Medicine

## 2018-07-31 DIAGNOSIS — I10 Essential (primary) hypertension: Secondary | ICD-10-CM | POA: Diagnosis not present

## 2018-07-31 MED ORDER — AMLODIPINE BESYLATE 10 MG PO TABS: 10.0000 mg | ORAL_TABLET | Freq: Every day | ORAL | 6 refills | Status: DC

## 2018-07-31 NOTE — Patient Instructions (Signed)
It was nice to see you today   Continue your routine medications but increase AMLODIPINE to 10 mg every day.   I sent a new prescription   If you have plenty of amlodipine 5 mg, you can take 2 tablets together until you run out   Amlodipine may cause swelling of the legs. Call if that is a problem   Stay active and try to eat healthy particularly low-salt.    Check the blood pressure daily   Be sure your blood pressure is between 110/65 and 145/85.   If your blood pressures not improving in the next few days please let me know

## 2018-07-31 NOTE — Progress Notes (Signed)
Subjective:    Patient ID: Chelsea [name], female    DOB: 11-29-1970, 48 y.o.   MRN: [id number]  DOS:  07/31/2018 Type of visit - description: Virtual Visit via Video Note  I connected with@   by a video enabled telemedicine application and verified that I am speaking with the correct person using two identifiers.   THIS ENCOUNTER IS A VIRTUAL VISIT DUE TO COVID-19 - PATIENT WAS NOT SEEN IN THE OFFICE. PATIENT HAS CONSENTED TO VIRTUAL VISIT / TELEMEDICINE VISIT   Location of patient: home  Location of provider: office  I discussed the limitations of evaluation and management by telemedicine and the availability of in person appointments. The patient expressed understanding and agreed to proceed.  History of Present Illness: Acute. Patient concerned about her blood pressure.  On chart review, went to the ER 07/13/2018, had anxiety.  BP at that time was 132/98.  Went to the ER again 07/15/2018, she had insomnia, BP was 130/90.  Labs were done, all okay except for a potassium of 3.4.  Since the ER visits, she is doing much better emotionally and is sleeping better.  She is concerned because at home, yesterday, her BP was 160/109.  This morning 156/96.    BP Readings from Last 3 Encounters:  07/15/18 130/90  07/13/18 (!) 132/98  07/09/18 (!) 143/94    Review of Systems Denies chest pain no difficulty breathing No nausea, vomiting or diarrhea No headache  Past Medical History:  Diagnosis Date  . Allergy   . Bipolar disorder ([place])   . Depression   . Eczema   . Elevated cholesterol   . Hypertension   . Schizophrenia [facility])     Past Surgical History:  Procedure Laterality Date  . NO PAST SURGERIES      Social History   Socioeconomic History  . Marital status: Divorced    Spouse name: Not on file  . Number of children: Not on file  . Years of education: Not on file  . Highest education level: Not on file  Occupational History  . Occupation: disability   Social  Needs  . Financial resource strain: Not on file  . Food insecurity    Worry: Not on file    Inability: Not on file  . Transportation needs    Medical: Not on file    Non-medical: Not on file  Tobacco Use  . Smoking status: Current Every Day Smoker    Packs/day: 1.50    Types: Cigarettes  . Smokeless tobacco: Never Used  Substance and Sexual Activity  . Alcohol use: Yes    Frequency: Never    Comment:  socially , rare   . Drug use: No  . Sexual activity: Not Currently  Lifestyle  . Physical activity    Days per week: Not on file    Minutes per session: Not on file  . Stress: Not on file  Relationships  . Social Herbalist on phone: Not on file    Gets together: Not on file    Attends religious service: Not on file    Active member of club or organization: Not on file    Attends meetings of clubs or organizations: Not on file    Relationship status: Not on file  . Intimate partner violence    Fear of current or ex partner: Not on file    Emotionally abused: Not on file    Physically abused: Not on file  Forced sexual activity: Not on file  Other Topics Concern  . Not on file  Social History Narrative   [name]   Lives w/ parents    Father Hardie PulleyMiguel Trimarco       Allergies as of 07/31/2018      Reactions   Divalproex Sodium Er Other (See Comments)   Delirium with hyperammonemia   Aspirin    REACTION: swelling   Fish Allergy       Medication List       Accurate as of July 31, 2018 11:59 PM. If you have any questions, ask your nurse or doctor.        STOP taking these medications   temazepam 22.5 MG capsule Commonly known as: RESTORIL     TAKE these medications   amLODipine 10 MG tablet Commonly known as: NORVASC Take 1 tablet (10 mg total) by mouth daily. What changed:   medication strength  how much to take   atorvastatin 20 MG tablet Commonly known as: LIPITOR Take 1 tablet (20 mg total) by mouth at bedtime.   benztropine 0.5 MG  tablet Commonly known as: COGENTIN Take 1 tablet (0.5 mg total) by mouth 2 (two) times daily. For prevention of drug induced tremors   cariprazine capsule Commonly known as: VRAYLAR Take 1 capsule (3 mg total) by mouth daily. For depression   cloNIDine 0.1 MG tablet Commonly known as: CATAPRES Take 1 tablet (0.1 mg total) by mouth 2 (two) times daily. For high blood pressure   nicotine polacrilex 2 MG gum Commonly known as: NICORETTE Take 1 each (2 mg total) by mouth as needed for smoking cessation. (May but from over the counter): For smoking cessation   traZODone 50 MG tablet Commonly known as: DESYREL Take 1 tablet (50 mg total) by mouth at bedtime.           Objective:   Physical Exam LMP 07/07/2018 (Approximate)      This is a virtual video visit.  Alert oriented x3, in no physical or emotional distress Assessment     Assessment HTN Hyperlipidemia Bipolar disorder; schizophrenia (dx as teenager); seen in [place] Ecz[facility]. [facility]) Tobacco abuse  Admitted 11-2017 s/i, h/i   PLAN: HTN: Previously well controlled with clonidine and amlodipine 5 mg daily.  The last few days BPs have increased but she remains asymptomatic.  Could be related to some recent emotional stress. Plan: Increase amlodipine to 10 mg daily, continue clonidine, monitor BPs, goals discussed , side effects discussed including edema.  AVS printed and mailed to the patient. Asked to call me if not at goal within few days.    I discussed the assessment and treatment plan with the patient. The patient was provided an opportunity to ask questions and all were answered. The patient agreed with the plan and demonstrated an understanding of the instructions.   The patient was advised to call back or seek an in-person evaluation if the symptoms worsen or if the condition fails to improve as anticipated.

## 2018-08-01 NOTE — Assessment & Plan Note (Signed)
HTN: Previously well controlled with clonidine and amlodipine 5 mg daily.  The last few days BPs have increased but she remains asymptomatic.  Could be related to some recent emotional stress. Plan: Increase amlodipine to 10 mg daily, continue clonidine, monitor BPs, goals discussed , side effects discussed including edema.  AVS printed and mailed to the patient. Asked to call me if not at goal within few days.

## 2018-08-07 ENCOUNTER — Ambulatory Visit: Payer: Self-pay | Admitting: Internal Medicine

## 2018-08-07 NOTE — Telephone Encounter (Signed)
Pt reports upsetting day; "I took a test online and failed." States has been upset, took BP  prior to call,156/106. Saw Dr. Larose Kells 07/31/2018,  increased amlodipine from 5mg  daily to 10 mg daily. Pt states she may have missed a dose early on but has taken the 10mg  last 3 days. States Monday BP 140/94; did not check BP yesterday. Denies any symptoms; no headache, no dizziness or visual changes. After hours call. Assured pt [place] would route encounter to practice for Dr. Ethel Rana review. Care advise given per protocol' advised ED if sudden severe headache, weakness, dizziness, SOB or CP occur. Discussed relaxation techniques. Pt verbalizes understanding . CB# [phone]   Reason for Disposition . Systolic BP  >= 665 OR Diastolic >= 993  Answer Assessment - Initial Assessment Questions 1. BLOOD PRESSURE: "What is the blood pressure?" "Did you take at least two measurements 5 minutes apart?"     156/106 2. ONSET: "When did you take your blood pressure?"     This afternoon 3. HOW: "How did you obtain the blood pressure?" (e.g., visiting nurse, automatic home BP monitor)    Home monitor 4. HISTORY: "Do you have a history of high blood pressure?"     yes 5. MEDICATIONS: "Are you taking any medications for blood pressure?" "Have you missed any doses recently?"     Saw dr. Larose Kells 7/22. Increased norvasc from 5mg  to 10mg  6. OTHER SYMPTOMS: "Do you have any symptoms?" (e.g., headache, chest pain, blurred vision, difficulty breathing, weakness)     no 7. PREGNANCY: "Is there any chance you are pregnant?" "When was your last menstrual period?"    no  Protocols used: HIGH BLOOD PRESSURE-A-AH

## 2018-08-08 NOTE — Telephone Encounter (Signed)
Patient notified of notes.  She will call in 10-14 days with bp readings.

## 2018-08-08 NOTE — Telephone Encounter (Signed)
Please advise 

## 2018-08-08 NOTE — Telephone Encounter (Signed)
Advise patient, I agreed that her BP is  slightly elevated however we just increased amlodipine so we will take more time for it to kick in. Recommend to continue present care, continue monitoring BPs and call in 10 days to 2 weeks with BP readings

## 2018-08-14 ENCOUNTER — Ambulatory Visit (INDEPENDENT_AMBULATORY_CARE_PROVIDER_SITE_OTHER): Payer: Medicare HMO | Admitting: Adult Health

## 2018-08-14 ENCOUNTER — Other Ambulatory Visit: Payer: Self-pay

## 2018-08-14 DIAGNOSIS — F411 Generalized anxiety disorder: Secondary | ICD-10-CM | POA: Diagnosis not present

## 2018-08-14 DIAGNOSIS — F331 Major depressive disorder, recurrent, moderate: Secondary | ICD-10-CM

## 2018-08-14 DIAGNOSIS — F319 Bipolar disorder, unspecified: Secondary | ICD-10-CM

## 2018-08-14 DIAGNOSIS — F250 Schizoaffective disorder, bipolar type: Secondary | ICD-10-CM

## 2018-08-14 DIAGNOSIS — G4700 Insomnia, unspecified: Secondary | ICD-10-CM | POA: Diagnosis not present

## 2018-08-15 DIAGNOSIS — F411 Generalized anxiety disorder: Secondary | ICD-10-CM | POA: Diagnosis not present

## 2018-08-31 ENCOUNTER — Encounter: Payer: Self-pay | Admitting: Adult Health

## 2018-08-31 MED ORDER — TRAZODONE HCL 50 MG PO TABS: 50.0000 mg | ORAL_TABLET | Freq: Every day | ORAL | 2 refills | Status: DC

## 2018-08-31 MED ORDER — RISPERIDONE 2 MG PO TABS: 2.0000 mg | ORAL_TABLET | Freq: Two times a day (BID) | ORAL | 2 refills | Status: DC

## 2018-08-31 MED ORDER — CARIPRAZINE HCL 3 MG PO CAPS: 3.0000 mg | ORAL_CAPSULE | Freq: Every day | ORAL | 0 refills | Status: DC

## 2018-08-31 MED ORDER — CLONIDINE HCL 0.1 MG PO TABS: 0.1000 mg | ORAL_TABLET | Freq: Two times a day (BID) | ORAL | 0 refills | Status: DC

## 2018-08-31 MED ORDER — BENZTROPINE MESYLATE 0.5 MG PO TABS: 0.5000 mg | ORAL_TABLET | Freq: Two times a day (BID) | ORAL | 0 refills | Status: DC

## 2018-08-31 NOTE — Progress Notes (Signed)
Cartha Rotert [id number] 1970/11/21 48 y.o.  Subjective:   Patient ID:  Chelsea [name] is a 48 y.o. (DOB Oct 05, 1970) female.  Chief Complaint:  Chief Complaint  Patient presents with  . Insomnia  . Anxiety  . Depression  . Manic Behavior    Bipolar Schizoaffective    HPI Chelsea [name] presents to the office today for follow-up of Bipolar disorder, schizoaffective disorder, insomnia, and anxiety.  Describes mood today as "ok". Pleasant Mood symptoms - depression, anxiety, and irritability. Does report intrusive thoughts, but will not elaborate on them. Stating "I don't like being alone lately". Taking mother with her on outings. Also stating she is not sure if she should be taking Vraylar - stating "I think it can cause paranoia". Inquiring about "invega injection". Has decreased tobacco use from 3 packs a day to 1 pack per day. Stating "I'm working on some new goals". Stable interest and motivation. Reports taking medications as prescribed.  Energy levels stable. Active, does not have a regular exercise routine. 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  Enjoys some usual interests and activities. Spending time with family. Lives at home with parents.  Appetite adequate. Weight stable. Sleeps 5 "solid" hours a night. Reports daytime napping.  Focus and concentration stable. Completing tasks. Managing aspects of household.  Denies SI or HI. Denies AH or VH.  Review of Systems:  Review of Systems  Musculoskeletal: Negative for gait problem.  Neurological: Negative for tremors.  Psychiatric/Behavioral:       Please refer to HPI    Medications: I have reviewed the patient's current medications.  Current Outpatient Medications  Medication Sig Dispense Refill  . amLODipine (NORVASC) 10 MG tablet Take 1 tablet (10 mg total) by mouth daily. 30 tablet 6  . atorvastatin (LIPITOR) 20 MG tablet Take 1 tablet (20 mg total) by mouth at bedtime. 90 tablet 1  . benztropine  (COGENTIN) 0.5 MG tablet Take 1 tablet (0.5 mg total) by mouth 2 (two) times daily. For prevention of drug induced tremors 60 tablet 0  . cariprazine (VRAYLAR) capsule Take 1 capsule (3 mg total) by mouth daily. For depression 30 capsule 0  . cloNIDine (CATAPRES) 0.1 MG tablet Take 1 tablet (0.1 mg total) by mouth 2 (two) times daily. For high blood pressure 60 tablet 0  . nicotine polacrilex (NICORETTE) 2 MG gum Take 1 each (2 mg total) by mouth as needed for smoking cessation. (May but from over the counter): For smoking cessation (Patient not taking: Reported on 07/15/2018) 100 tablet 0  . traZODone (DESYREL) 50 MG tablet Take 1 tablet (50 mg total) by mouth at bedtime. 20 tablet 0   No current facility-administered medications for this visit.     Medication Side Effects: None  Allergies:  Allergies  Allergen Reactions  . Divalproex Sodium Er Other (See Comments)    Delirium with hyperammonemia  . Aspirin     REACTION: swelling  . Fish Allergy     Past Medical History:  Diagnosis Date  . Allergy   . Bipolar disorder ([place])   . Depression   . Eczema   . Elevated cholesterol   . Hypertension   . Schizophrenia ([place])     Family History  Problem Relation Age of Onset  . Breast cancer Mother   . Hypertension Father   . Diabetes Paternal Grandmother   . Colon cancer Neg Hx   . CAD Neg Hx     Social History   Socioeconomic History  .  Marital status: Divorced    Spouse name: Not on file  . Number of children: Not on file  . Years of education: Not on file  . Highest education level: Not on file  Occupational History  . Occupation: disability   Social Needs  . Financial resource strain: Not on file  . Food insecurity    Worry: Not on file    Inability: Not on file  . Transportation needs    Medical: Not on file    Non-medical: Not on file  Tobacco Use  . Smoking status: Current Every Day Smoker    Packs/day: 1.50    Types: Cigarettes  . Smokeless tobacco: Never  Used  Substance and Sexual Activity  . Alcohol use: Yes    Frequency: Never    Comment:  socially , rare   . Drug use: No  . Sexual activity: Not Currently  Lifestyle  . Physical activity    Days per week: Not on file    Minutes per session: Not on file  . Stress: Not on file  Relationships  . Social Musicianconnections    Talks on phone: Not on file    Gets together: Not on file    Attends religious service: Not on file    Active member of club or organization: Not on file    Attends meetings of clubs or organizations: Not on file    Relationship status: Not on file  . Intimate partner violence    Fear of current or ex partner: Not on file    Emotionally abused: Not on file    Physically abused: Not on file    Forced sexual activity: Not on file  Other Topics Concern  . Not on file  Social History Narrative   [name]   Lives w/ parents    Father Hardie PulleyMiguel Lindon     Past Medical History, Surgical history, Social history, and Family history were reviewed and updated as appropriate.   Please see review of systems for further details on the patient's review from today.   Objective:   Physical Exam:  There were no vitals taken for this visit.  Physical Exam Constitutional:      General: She is not in acute distress.    Appearance: She is well-developed.  Musculoskeletal:        General: No deformity.  Neurological:     Mental Status: She is alert and oriented to person, place, and time.     Coordination: Coordination normal.  Psychiatric:        Attention and Perception: Attention and perception normal. She does not perceive auditory or visual hallucinations.        Mood and Affect: Mood is not anxious or depressed. Affect is not labile, blunt, angry or inappropriate.        Speech: Speech normal.        Behavior: Behavior normal.        Thought Content: Thought content is paranoid. Thought content is not delusional. Thought content does not include homicidal or suicidal  ideation. Thought content does not include homicidal or suicidal plan.        Cognition and Memory: Cognition and memory normal.        Judgment: Judgment normal.     Comments: Insight intact     Lab Review:     Component Value Date/Time   NA 136 07/15/2018 0743   K 3.4 (L) 07/15/2018 0743   CL 103 07/15/2018 0743   CO2 25  07/15/2018 0743   GLUCOSE 89 07/15/2018 0743   BUN 11 07/15/2018 0743   CREATININE 0.72 07/15/2018 0743   CALCIUM 8.9 07/15/2018 0743   PROT 7.0 07/15/2018 0743   ALBUMIN 3.8 07/15/2018 0743   AST 29 07/15/2018 0743   ALT 40 07/15/2018 0743   ALKPHOS 91 07/15/2018 0743   BILITOT 0.8 07/15/2018 0743   GFRNONAA >60 07/15/2018 0743   GFRAA >60 07/15/2018 0743       Component Value Date/Time   WBC 7.4 07/15/2018 0743   RBC 4.57 07/15/2018 0743   HGB 14.1 07/15/2018 0743   HCT 42.6 07/15/2018 0743   PLT 322 07/15/2018 0743   MCV 93.2 07/15/2018 0743   MCH 30.9 07/15/2018 0743   MCHC 33.1 07/15/2018 0743   RDW 14.0 07/15/2018 0743   LYMPHSABS 2.0 07/15/2018 0743   MONOABS 0.6 07/15/2018 0743   EOSABS 0.3 07/15/2018 0743   BASOSABS 0.0 07/15/2018 0743    No results found for: POCLITH, LITHIUM   No results found for: PHENYTOIN, PHENOBARB, VALPROATE, CBMZ   Assessment: Plan:    Plan:  1. Continue Benztropine 0.5mg  BID 2. Continue Clonidine 0.1mg  BID 3. Continue Vraylar 3mg  daily 4. Continue Risperdal 2mg  BID 5. Continue Trazadone 50mg  at hs  Concerns about compliance with current medication regimen. Will continue to monitor. Will consider injection versus po medications for compliance.  Advised patient to continue taking medications as prescribed. Had planned to taper off Risperdal due to possible increased Prolactin levels. Patient has not had levels checked but now denies any breast discharge.   Patient previously given taper instructions regarding taper at last 3 visits, but has not followed. Patient noted "mother would not let me".    Prolactin level next visit.   RTC 4 weeks  Patient advised to contact office with any questions, adverse effects, or acute worsening in signs and symptoms.  Discussed potential metabolic side effects associated with atypical antipsychotics, as well as potential risk for movement side effects. Advised pt to contact office if movement side effects occur.   There are no diagnoses linked to this encounter.   Please see After Visit Summary for patient specific instructions.  Future Appointments  Date Time Provider Department Center  09/11/2018 10:00 AM Ita Fritzsche, Thereasa Soloegina Nattalie, NP [facility] None  09/18/2018 11:00 AM Patton SallesAmundson C Silva, Brook E, MD [facility] None  01/08/2019  9:00 AM Wanda PlumpPaz, Jose E, MD [facility]    No orders of the defined types were placed in this encounter.   -------------------------------

## 2018-09-06 ENCOUNTER — Telehealth: Payer: Self-pay | Admitting: Internal Medicine

## 2018-09-06 NOTE — Telephone Encounter (Signed)
LMOM informing Pt of recommendations. Instructed to call office if questions/concerns.

## 2018-09-06 NOTE — Telephone Encounter (Signed)
(  I do not recall asking her to stop supplements). Advise patient, okay to take a multivitamin daily, vitamin D, vitamin C and calcium daily.

## 2018-09-06 NOTE — Telephone Encounter (Signed)
Patient was taking multivitamins and minerals as well as vitamin C and was advised to stop by PCP. Wants to know the reasoning and if she can resume taking the medications. Please advise.

## 2018-09-06 NOTE — Telephone Encounter (Signed)
Please advise 

## 2018-09-11 ENCOUNTER — Encounter: Payer: Self-pay | Admitting: Adult Health

## 2018-09-11 ENCOUNTER — Other Ambulatory Visit: Payer: Self-pay

## 2018-09-11 ENCOUNTER — Ambulatory Visit (INDEPENDENT_AMBULATORY_CARE_PROVIDER_SITE_OTHER): Payer: Medicare HMO | Admitting: Adult Health

## 2018-09-11 DIAGNOSIS — Z79899 Other long term (current) drug therapy: Secondary | ICD-10-CM | POA: Diagnosis not present

## 2018-09-11 DIAGNOSIS — F331 Major depressive disorder, recurrent, moderate: Secondary | ICD-10-CM | POA: Diagnosis not present

## 2018-09-11 DIAGNOSIS — F319 Bipolar disorder, unspecified: Secondary | ICD-10-CM | POA: Diagnosis not present

## 2018-09-11 DIAGNOSIS — F411 Generalized anxiety disorder: Secondary | ICD-10-CM | POA: Diagnosis not present

## 2018-09-11 DIAGNOSIS — G4700 Insomnia, unspecified: Secondary | ICD-10-CM | POA: Diagnosis not present

## 2018-09-11 NOTE — Progress Notes (Signed)
Chelsea [name] [id number] 1970-12-14 48 y.o.  Subjective:   Patient ID:  Chelsea [name] is a 48 y.o. (DOB 1970-12-14) female.  Chief Complaint:  No chief complaint on file.   HPI Chelsea [name] presents to the office today for follow-up of Bipolar disorder, schizoaffective disorder, insomnia, and anxiety.  Describes mood today as "ok". Pleasant Mood symptoms - depression, anxiety, and irritability. Stating "I feel better". Denies intrusive thoughts. Reports breast discharge has started again. Does not feel like she needs to have mother with her like she did. Helping mother out at the house - haven't done that in a while. Has decreased tobacco use - smoking one pack a day. Plans to start smoking "less". Stable interest and motivation. Working on [employer] - "social media".  Reports taking medications as prescribed, but would like to stop both of them.  Energy levels stable. Active, does not have a regular exercise routine. Trying to get a part time job so she can work from home. Took a test online for one job and failed it - can take again in 30 days.  Enjoys some usual interests and activities. Lives at home with parents.  Appetite adequate. Weight gain - 5 pounds. Sleeps 5 hours a night. Reports daytime napping - hour.  Focus and concentration stable. Completing tasks. Managing aspects of household.  Denies SI or HI. Denies AH or VH.  Review of Systems:  Review of Systems  Musculoskeletal: Negative for gait problem.  Neurological: Negative for tremors.  Psychiatric/Behavioral:       Please refer to HPI    Medications: I have reviewed the patient's current medications.  Current Outpatient Medications  Medication Sig Dispense Refill  . amLODipine (NORVASC) 10 MG tablet Take 1 tablet (10 mg total) by mouth daily. 30 tablet 6  . atorvastatin (LIPITOR) 20 MG tablet Take 1 tablet (20 mg total) by mouth at bedtime. 90 tablet 1  . benztropine (COGENTIN) 0.5 MG tablet Take 1 tablet (0.5  mg total) by mouth 2 (two) times daily. For prevention of drug induced tremors 60 tablet 0  . cariprazine (VRAYLAR) capsule Take 1 capsule (3 mg total) by mouth daily. For depression 28 capsule 0  . cloNIDine (CATAPRES) 0.1 MG tablet Take 1 tablet (0.1 mg total) by mouth 2 (two) times daily. For high blood pressure 60 tablet 0  . nicotine polacrilex (NICORETTE) 2 MG gum Take 1 each (2 mg total) by mouth as needed for smoking cessation. (May but from over the counter): For smoking cessation (Patient not taking: Reported on 07/15/2018) 100 tablet 0  . risperiDONE (RISPERDAL) 2 MG tablet Take 1 tablet (2 mg total) by mouth 2 (two) times daily. 60 tablet 2  . traZODone (DESYREL) 50 MG tablet Take 1 tablet (50 mg total) by mouth at bedtime. 30 tablet 2   No current facility-administered medications for this visit.     Medication Side Effects: None  Allergies:  Allergies  Allergen Reactions  . Divalproex Sodium Er Other (See Comments)    Delirium with hyperammonemia  . Aspirin     REACTION: swelling  . Fish Allergy     Past Medical History:  Diagnosis Date  . Allergy   . Bipolar disorder (HCC)   . Depression   . Eczema   . Elevated cholesterol   . Hypertension   . Schizophrenia (HCC)     Family History  Problem Relation Age of Onset  . Breast cancer Mother   . Hypertension Father   . Diabetes Paternal  Grandmother   . Colon cancer Neg Hx   . CAD Neg Hx     Social History   Socioeconomic History  . Marital status: Divorced    Spouse name: Not on file  . Number of children: Not on file  . Years of education: Not on file  . Highest education level: Not on file  Occupational History  . Occupation: disability   Social Needs  . Financial resource strain: Not on file  . Food insecurity    Worry: Not on file    Inability: Not on file  . Transportation needs    Medical: Not on file    Non-medical: Not on file  Tobacco Use  . Smoking status: Current Every Day Smoker     Packs/day: 1.50    Types: Cigarettes  . Smokeless tobacco: Never Used  Substance and Sexual Activity  . Alcohol use: Yes    Frequency: Never    Comment:  socially , rare   . Drug use: No  . Sexual activity: Not Currently  Lifestyle  . Physical activity    Days per week: Not on file    Minutes per session: Not on file  . Stress: Not on file  Relationships  . Social Musician on phone: Not on file    Gets together: Not on file    Attends religious service: Not on file    Active member of club or organization: Not on file    Attends meetings of clubs or organizations: Not on file    Relationship status: Not on file  . Intimate partner violence    Fear of current or ex partner: Not on file    Emotionally abused: Not on file    Physically abused: Not on file    Forced sexual activity: Not on file  Other Topics Concern  . Not on file  Social History Narrative   [name]   Lives w/ parents    Father [name]     Past Medical History, Surgical history, Social history, and Family history were reviewed and updated as appropriate.   Please see review of systems for further details on the patient's review from today.   Objective:   Physical Exam:  There were no vitals taken for this visit.  Physical Exam Constitutional:      General: She is not in acute distress.    Appearance: She is well-developed.  Musculoskeletal:        General: No deformity.  Neurological:     Mental Status: She is alert and oriented to person, place, and time.     Coordination: Coordination normal.  Psychiatric:        Attention and Perception: Attention and perception normal. She does not perceive auditory or visual hallucinations.        Mood and Affect: Mood is not anxious or depressed. Affect is not labile, blunt, angry or inappropriate.        Speech: Speech normal.        Behavior: Behavior normal.        Thought Content: Thought content is not paranoid or delusional. Thought  content does not include homicidal or suicidal ideation. Thought content does not include homicidal or suicidal plan.        Cognition and Memory: Cognition and memory normal.        Judgment: Judgment normal.     Comments: Insight intact    Lab Review:     Component Value Date/Time  NA 136 07/15/2018 0743   K 3.4 (L) 07/15/2018 0743   CL 103 07/15/2018 0743   CO2 25 07/15/2018 0743   GLUCOSE 89 07/15/2018 0743   BUN 11 07/15/2018 0743   CREATININE 0.72 07/15/2018 0743   CALCIUM 8.9 07/15/2018 0743   PROT 7.0 07/15/2018 0743   ALBUMIN 3.8 07/15/2018 0743   AST 29 07/15/2018 0743   ALT 40 07/15/2018 0743   ALKPHOS 91 07/15/2018 0743   BILITOT 0.8 07/15/2018 0743   GFRNONAA >60 07/15/2018 0743   GFRAA >60 07/15/2018 0743       Component Value Date/Time   WBC 7.4 07/15/2018 0743   RBC 4.57 07/15/2018 0743   HGB 14.1 07/15/2018 0743   HCT 42.6 07/15/2018 0743   PLT 322 07/15/2018 0743   MCV 93.2 07/15/2018 0743   MCH 30.9 07/15/2018 0743   MCHC 33.1 07/15/2018 0743   RDW 14.0 07/15/2018 0743   LYMPHSABS 2.0 07/15/2018 0743   MONOABS 0.6 07/15/2018 0743   EOSABS 0.3 07/15/2018 0743   BASOSABS 0.0 07/15/2018 0743    No results found for: POCLITH, LITHIUM   No results found for: PHENYTOIN, PHENOBARB, VALPROATE, CBMZ   Assessment: Plan:    Plan:  1. Continue Benztropine 0.5mg  BID 2. Continue Clonidine 0.1mg  BID 3. Continue Vraylar 3mg  daily 4. Continue Risperdal 2mg  BID 5. Continue Trazadone 50mg  at hs  Order Prolactin level  RTC 4 weeks  Patient advised to contact office with any questions, adverse effects, or acute worsening in signs and symptoms.  Discussed potential metabolic side effects associated with atypical antipsychotics, as well as potential risk for movement side effects. Advised pt to contact office if movement side effects occur.   Diagnoses and all orders for this visit:  Generalized anxiety disorder  Major depressive disorder, recurrent  episode, moderate (HCC)  Bipolar I disorder (HCC)  Insomnia, unspecified type  High risk medication use -     Prolactin     Please see After Visit Summary for patient specific instructions.  Future Appointments  Date Time Provider [place]  09/18/2018 11:00 AM Nunzio Cobbs, MD [place] None  01/08/2019  9:00 AM Colon Branch, MD [facility]    Orders Placed This Encounter  Procedures  . Prolactin    -------------------------------

## 2018-09-18 ENCOUNTER — Ambulatory Visit: Payer: Medicare HMO | Admitting: Obstetrics and Gynecology

## 2018-10-03 DIAGNOSIS — Z79899 Other long term (current) drug therapy: Secondary | ICD-10-CM | POA: Diagnosis not present

## 2018-10-09 ENCOUNTER — Encounter: Payer: Self-pay | Admitting: Adult Health

## 2018-10-09 ENCOUNTER — Other Ambulatory Visit: Payer: Self-pay

## 2018-10-09 ENCOUNTER — Ambulatory Visit (INDEPENDENT_AMBULATORY_CARE_PROVIDER_SITE_OTHER): Payer: Medicare HMO | Admitting: Adult Health

## 2018-10-09 DIAGNOSIS — G4700 Insomnia, unspecified: Secondary | ICD-10-CM | POA: Diagnosis not present

## 2018-10-09 DIAGNOSIS — F331 Major depressive disorder, recurrent, moderate: Secondary | ICD-10-CM

## 2018-10-09 DIAGNOSIS — F411 Generalized anxiety disorder: Secondary | ICD-10-CM

## 2018-10-09 DIAGNOSIS — F319 Bipolar disorder, unspecified: Secondary | ICD-10-CM | POA: Diagnosis not present

## 2018-10-09 DIAGNOSIS — F250 Schizoaffective disorder, bipolar type: Secondary | ICD-10-CM

## 2018-10-09 NOTE — Progress Notes (Signed)
Chelsea [name] [id number] 1970/07/25 48 y.o.  Subjective:   Patient ID:  Chelsea [name] is a 48 y.o. (DOB [birth year]) female.  Chief Complaint:  No chief complaint on file.   HPI Chelsea [name] presents to the office today for follow-up of Bipolar disorder, schizoaffective disorder, insomnia, and anxiety.  Describes mood today as "ok". Pleasant Mood symptoms - denies depression, anxiety, and irritability. Stating "I feel alright". Denies intrusive thoughts "none".. Reports breast discharge - did have levels drawn last week 10/02/2018. Helping family around the house - cleaning. Has decreased tobacco use - smoking one pack every 2 days. Stable interest and motivation. Not Social networking - has deleted a lot of accounts. Reports taking medications as prescribed. Mother has medications, does not monitor her taking them.  Energy levels stable. Active, does not have a regular exercise routine. Has stopped looking for a job.  Enjoys some usual interests and activities. Lives at home with parents. Getting out some days.  Appetite adequate. Weight stable. Sleeping better some nights than others. Averages 6 to 7 hours. Having a "little" problem with sleep. Focus and concentration stable. Completing tasks. Managing aspects of household.  Denies SI or HI. Denies AH or VH.  Review of Systems:  Review of Systems  Musculoskeletal: Negative for gait problem.  Neurological: Negative for tremors.  Psychiatric/Behavioral:       Please refer to HPI    Medications: I have reviewed the patient's current medications.  Current Outpatient Medications  Medication Sig Dispense Refill  . amLODipine (NORVASC) 10 MG tablet Take 1 tablet (10 mg total) by mouth daily. 30 tablet 6  . atorvastatin (LIPITOR) 20 MG tablet Take 1 tablet (20 mg total) by mouth at bedtime. 90 tablet 1  . benztropine (COGENTIN) 0.5 MG tablet Take 1 tablet (0.5 mg total) by mouth 2 (two) times daily. For prevention of drug induced  tremors 60 tablet 0  . cariprazine (VRAYLAR) capsule Take 1 capsule (3 mg total) by mouth daily. For depression 28 capsule 0  . cloNIDine (CATAPRES) 0.1 MG tablet Take 1 tablet (0.1 mg total) by mouth 2 (two) times daily. For high blood pressure 60 tablet 0  . nicotine polacrilex (NICORETTE) 2 MG gum Take 1 each (2 mg total) by mouth as needed for smoking cessation. (May but from over the counter): For smoking cessation (Patient not taking: Reported on 07/15/2018) 100 tablet 0  . risperiDONE (RISPERDAL) 2 MG tablet Take 1 tablet (2 mg total) by mouth 2 (two) times daily. 60 tablet 2  . traZODone (DESYREL) 50 MG tablet Take 1 tablet (50 mg total) by mouth at bedtime. 30 tablet 2   No current facility-administered medications for this visit.     Medication Side Effects: None  Allergies:  Allergies  Allergen Reactions  . Divalproex Sodium Er Other (See Comments)    Delirium with hyperammonemia  . Aspirin     REACTION: swelling  . Fish Allergy     Past Medical History:  Diagnosis Date  . Allergy   . Bipolar disorder (HCC)   . Depression   . Eczema   . Elevated cholesterol   . Hypertension   . Schizophrenia (HCC)     Family History  Problem Relation Age of Onset  . Breast cancer Mother   . Hypertension Father   . Diabetes Paternal Grandmother   . Colon cancer Neg Hx   . CAD Neg Hx     Social History   Socioeconomic History  . Marital status: Divorced  Spouse name: Not on file  . Number of children: Not on file  . Years of education: Not on file  . Highest education level: Not on file  Occupational History  . Occupation: disability   Social Needs  . Financial resource strain: Not on file  . Food insecurity    Worry: Not on file    Inability: Not on file  . Transportation needs    Medical: Not on file    Non-medical: Not on file  Tobacco Use  . Smoking status: Current Every Day Smoker    Packs/day: 1.50    Types: Cigarettes  . Smokeless tobacco: Never Used   Substance and Sexual Activity  . Alcohol use: Yes    Frequency: Never    Comment:  socially , rare   . Drug use: No  . Sexual activity: Not Currently  Lifestyle  . Physical activity    Days per week: Not on file    Minutes per session: Not on file  . Stress: Not on file  Relationships  . Social Herbalist on phone: Not on file    Gets together: Not on file    Attends religious service: Not on file    Active member of club or organization: Not on file    Attends meetings of clubs or organizations: Not on file    Relationship status: Not on file  . Intimate partner violence    Fear of current or ex partner: Not on file    Emotionally abused: Not on file    Physically abused: Not on file    Forced sexual activity: Not on file  Other Topics Concern  . Not on file  Social History Narrative   [name]   Lives w/ parents    Father Wells Gerdeman     Past Medical History, Surgical history, Social history, and Family history were reviewed and updated as appropriate.   Please see review of systems for further details on the patient's review from today.   Objective:   Physical Exam:  There were no vitals taken for this visit.  Physical Exam Constitutional:      General: She is not in acute distress.    Appearance: She is well-developed.  Musculoskeletal:        General: No deformity.  Neurological:     Mental Status: She is alert and oriented to person, place, and time.     Coordination: Coordination normal.  Psychiatric:        Attention and Perception: Attention and perception normal. She does not perceive auditory or visual hallucinations.        Mood and Affect: Mood normal. Mood is not anxious or depressed. Affect is not labile, blunt, angry or inappropriate.        Speech: Speech normal.        Behavior: Behavior normal.        Thought Content: Thought content normal. Thought content is not paranoid or delusional. Thought content does not include homicidal or  suicidal ideation. Thought content does not include homicidal or suicidal plan.        Cognition and Memory: Cognition and memory normal.        Judgment: Judgment normal.     Comments: Insight intact    Lab Review:     Component Value Date/Time   NA 136 07/15/2018 0743   K 3.4 (L) 07/15/2018 0743   CL 103 07/15/2018 0743   CO2 25 07/15/2018 0743  GLUCOSE 89 07/15/2018 0743   BUN 11 07/15/2018 0743   CREATININE 0.72 07/15/2018 0743   CALCIUM 8.9 07/15/2018 0743   PROT 7.0 07/15/2018 0743   ALBUMIN 3.8 07/15/2018 0743   AST 29 07/15/2018 0743   ALT 40 07/15/2018 0743   ALKPHOS 91 07/15/2018 0743   BILITOT 0.8 07/15/2018 0743   GFRNONAA >60 07/15/2018 0743   GFRAA >60 07/15/2018 0743       Component Value Date/Time   WBC 7.4 07/15/2018 0743   RBC 4.57 07/15/2018 0743   HGB 14.1 07/15/2018 0743   HCT 42.6 07/15/2018 0743   PLT 322 07/15/2018 0743   MCV 93.2 07/15/2018 0743   MCH 30.9 07/15/2018 0743   MCHC 33.1 07/15/2018 0743   RDW 14.0 07/15/2018 0743   LYMPHSABS 2.0 07/15/2018 0743   MONOABS 0.6 07/15/2018 0743   EOSABS 0.3 07/15/2018 0743   BASOSABS 0.0 07/15/2018 0743    No results found for: POCLITH, LITHIUM   No results found for: PHENYTOIN, PHENOBARB, VALPROATE, CBMZ   Assessment: Plan:    Plan:  1. Benztropine 0.5mg  BID 2. Clonidine 0.1mg  BID 3. Vraylar 3mg  daily 4. Risperdal 2mg  BID 5. Trazadone 50mg  at hs  Order Prolactin level - patient had labs drawn 10/02/2018 - awaiting results.  RTC 4 weeks  Patient advised to contact office with any questions, adverse effects, or acute worsening in signs and symptoms.  Discussed potential metabolic side effects associated with atypical antipsychotics, as well as potential risk for movement side effects. Advised pt to contact office if movement side effects occur.   Diagnoses and all orders for this visit:  Generalized anxiety disorder  Major depressive disorder, recurrent episode, moderate  (HCC)  Bipolar I disorder (HCC)  Insomnia, unspecified type  Schizoaffective disorder, bipolar type (HCC)     Please see After Visit Summary for patient specific instructions.  Future Appointments  Date Time Provider Department Center  01/08/2019  9:00 AM Wanda PlumpPaz, Jose E, MD [facility]    No orders of the defined types were placed in this encounter.   -------------------------------

## 2018-10-14 ENCOUNTER — Telehealth: Payer: Self-pay | Admitting: Adult Health

## 2018-10-14 NOTE — Telephone Encounter (Signed)
Yes that's why I asked them to come in so we can discuss and get a new plan together.

## 2018-10-14 NOTE — Telephone Encounter (Signed)
Left detailed voicemail, labs will be discussed at office visit

## 2018-10-14 NOTE — Telephone Encounter (Signed)
Pt left v-mail asking to talk to [facility] and needing to know if you received labs. Call bak at [phone]

## 2018-10-16 ENCOUNTER — Ambulatory Visit (INDEPENDENT_AMBULATORY_CARE_PROVIDER_SITE_OTHER): Payer: Medicare HMO | Admitting: Adult Health

## 2018-10-16 ENCOUNTER — Other Ambulatory Visit: Payer: Self-pay

## 2018-10-16 ENCOUNTER — Encounter: Payer: Self-pay | Admitting: Adult Health

## 2018-10-16 DIAGNOSIS — F331 Major depressive disorder, recurrent, moderate: Secondary | ICD-10-CM

## 2018-10-16 DIAGNOSIS — F319 Bipolar disorder, unspecified: Secondary | ICD-10-CM

## 2018-10-16 DIAGNOSIS — G4700 Insomnia, unspecified: Secondary | ICD-10-CM | POA: Diagnosis not present

## 2018-10-16 DIAGNOSIS — F411 Generalized anxiety disorder: Secondary | ICD-10-CM

## 2018-10-16 DIAGNOSIS — F250 Schizoaffective disorder, bipolar type: Secondary | ICD-10-CM

## 2018-10-16 NOTE — Progress Notes (Signed)
Chelsea BostonJulissa [name] [id number] [birth year] 48 y.o.  Subjective:   Patient ID:  Chelsea BostonJulissa [name] is a 48 y.o. (DOB [birth year]) female.  Chief Complaint:  No chief complaint on file.   HPI   Accompanied by mother  Chelsea BostonJulissa Mishkin presents to the office today for follow-up of Bipolar disorder, schizoaffective disorder, insomnia, and anxiety.  Describes mood today as "ok". Pleasant Mood symptoms - denies depression, anxiety, and irritability. Stating "I'm doing pretty good". Continues to report breast discharge. She and family doing well. Has been more helpful around the house. Stable interest and motivation. Reports taking medications as prescribed. Energy levels stable. Active, does not have a regular exercise routine. Has stopped looking for a job. Getting out some days.  Appetite adequate. Weight stable. Sleeping better some nights than others. Averages 6 to 7 hours. Napping during the day. Focus and concentration stable. Completing tasks. Managing aspects of household.  Denies SI or HI. Denies AH or VH.  Review of Systems:  Review of Systems  Musculoskeletal: Negative for gait problem.  Neurological: Negative for tremors.  Psychiatric/Behavioral:       Please refer to HPI    Medications: I have reviewed the patient's current medications.  Current Outpatient Medications  Medication Sig Dispense Refill  . amLODipine (NORVASC) 10 MG tablet Take 1 tablet (10 mg total) by mouth daily. 30 tablet 6  . atorvastatin (LIPITOR) 20 MG tablet Take 1 tablet (20 mg total) by mouth at bedtime. 90 tablet 1  . benztropine (COGENTIN) 0.5 MG tablet Take 1 tablet (0.5 mg total) by mouth 2 (two) times daily. For prevention of drug induced tremors 60 tablet 0  . cariprazine (VRAYLAR) capsule Take 1 capsule (3 mg total) by mouth daily. For depression 28 capsule 0  . cloNIDine (CATAPRES) 0.1 MG tablet Take 1 tablet (0.1 mg total) by mouth 2 (two) times daily. For high blood pressure 60 tablet 0  . nicotine  polacrilex (NICORETTE) 2 MG gum Take 1 each (2 mg total) by mouth as needed for smoking cessation. (May but from over the counter): For smoking cessation (Patient not taking: Reported on 07/15/2018) 100 tablet 0  . risperiDONE (RISPERDAL) 2 MG tablet Take 1 tablet (2 mg total) by mouth 2 (two) times daily. 60 tablet 2  . traZODone (DESYREL) 50 MG tablet Take 1 tablet (50 mg total) by mouth at bedtime. 30 tablet 2   No current facility-administered medications for this visit.     Medication Side Effects: None  Allergies:  Allergies  Allergen Reactions  . Divalproex Sodium Er Other (See Comments)    Delirium with hyperammonemia  . Aspirin     REACTION: swelling  . Fish Allergy     Past Medical History:  Diagnosis Date  . Allergy   . Bipolar disorder (HCC)   . Depression   . Eczema   . Elevated cholesterol   . Hypertension   . Schizophrenia (HCC)     Family History  Problem Relation Age of Onset  . Breast cancer Mother   . Hypertension Father   . Diabetes Paternal Grandmother   . Colon cancer Neg Hx   . CAD Neg Hx     Social History   Socioeconomic History  . Marital status: Divorced    Spouse name: Not on file  . Number of children: Not on file  . Years of education: Not on file  . Highest education level: Not on file  Occupational History  . Occupation: disability   Social Needs  .  Financial resource strain: Not on file  . Food insecurity    Worry: Not on file    Inability: Not on file  . Transportation needs    Medical: Not on file    Non-medical: Not on file  Tobacco Use  . Smoking status: Current Every Day Smoker    Packs/day: 1.50    Types: Cigarettes  . Smokeless tobacco: Never Used  Substance and Sexual Activity  . Alcohol use: Yes    Frequency: Never    Comment:  socially , rare   . Drug use: No  . Sexual activity: Not Currently  Lifestyle  . Physical activity    Days per week: Not on file    Minutes per session: Not on file  . Stress: Not  on file  Relationships  . Social Herbalist on phone: Not on file    Gets together: Not on file    Attends religious service: Not on file    Active member of club or organization: Not on file    Attends meetings of clubs or organizations: Not on file    Relationship status: Not on file  . Intimate partner violence    Fear of current or ex partner: Not on file    Emotionally abused: Not on file    Physically abused: Not on file    Forced sexual activity: Not on file  Other Topics Concern  . Not on file  Social History Narrative   [name]   Lives w/ parents    Father Laquandra Carrillo     Past Medical History, Surgical history, Social history, and Family history were reviewed and updated as appropriate.   Please see review of systems for further details on the patient's review from today.   Objective:   Physical Exam:  There were no vitals taken for this visit.  Physical Exam Constitutional:      General: She is not in acute distress.    Appearance: She is well-developed.  Musculoskeletal:        General: No deformity.  Neurological:     Mental Status: She is alert and oriented to person, place, and time.     Coordination: Coordination normal.  Psychiatric:        Attention and Perception: Attention and perception normal. She does not perceive auditory or visual hallucinations.        Mood and Affect: Mood normal. Mood is not anxious or depressed. Affect is not labile, blunt, angry or inappropriate.        Speech: Speech normal.        Behavior: Behavior normal.        Thought Content: Thought content normal. Thought content is not paranoid or delusional. Thought content does not include homicidal or suicidal ideation. Thought content does not include homicidal or suicidal plan.        Cognition and Memory: Cognition and memory normal.        Judgment: Judgment normal.     Comments: Insight intact    Lab Review:     Component Value Date/Time   NA 136 07/15/2018  0743   K 3.4 (L) 07/15/2018 0743   CL 103 07/15/2018 0743   CO2 25 07/15/2018 0743   GLUCOSE 89 07/15/2018 0743   BUN 11 07/15/2018 0743   CREATININE 0.72 07/15/2018 0743   CALCIUM 8.9 07/15/2018 0743   PROT 7.0 07/15/2018 0743   ALBUMIN 3.8 07/15/2018 0743   AST 29 07/15/2018 0743  ALT 40 07/15/2018 0743   ALKPHOS 91 07/15/2018 0743   BILITOT 0.8 07/15/2018 0743   GFRNONAA >60 07/15/2018 0743   GFRAA >60 07/15/2018 0743       Component Value Date/Time   WBC 7.4 07/15/2018 0743   RBC 4.57 07/15/2018 0743   HGB 14.1 07/15/2018 0743   HCT 42.6 07/15/2018 0743   PLT 322 07/15/2018 0743   MCV 93.2 07/15/2018 0743   MCH 30.9 07/15/2018 0743   MCHC 33.1 07/15/2018 0743   RDW 14.0 07/15/2018 0743   LYMPHSABS 2.0 07/15/2018 0743   MONOABS 0.6 07/15/2018 0743   EOSABS 0.3 07/15/2018 0743   BASOSABS 0.0 07/15/2018 0743    No results found for: POCLITH, LITHIUM   No results found for: PHENYTOIN, PHENOBARB, VALPROATE, CBMZ   Assessment: Plan:    Plan:  1. Benztropine 0.5mg  BID 2. Clonidine 0.1mg  BID 3. Vraylar 3mg  daily - start cross taper  4. Risperdal 2mg  BID - start cross taper 5. Trazadone 50mg  at hs  Received Prolactin results - will plan to discontinue Risperdal - will decrease Risperdal by one milligram a week and escalate Vraylar to 4.5/6mg  daily as tolerated.   Recheck Prolactin level next visit  RTC 4 weeks  Patient advised to contact office with any questions, adverse effects, or acute worsening in signs and symptoms.  Discussed potential metabolic side effects associated with atypical antipsychotics, as well as potential risk for movement side effects. Advised pt to contact office if movement side effects occur.   Diagnoses and all orders for this visit:  Schizoaffective disorder, bipolar type (HCC)  Insomnia, unspecified type  Bipolar I disorder (HCC)  Generalized anxiety disorder  Major depressive disorder, recurrent episode, moderate (HCC)      Please see After Visit Summary for patient specific instructions.  Future Appointments  Date Time Provider Department Center  11/06/2018  9:40 AM Lynnann Knudsen, , NP [facility] None  11/13/2018  9:20 AM Lillyn Wieczorek, 11/08/2018, NP [facility] None  01/08/2019  9:00 AM 13/04/2018, MD [facility]    No orders of the defined types were placed in this encounter.   -------------------------------

## 2018-11-06 ENCOUNTER — Encounter: Payer: Self-pay | Admitting: Adult Health

## 2018-11-06 ENCOUNTER — Ambulatory Visit (INDEPENDENT_AMBULATORY_CARE_PROVIDER_SITE_OTHER): Payer: Medicare HMO | Admitting: Adult Health

## 2018-11-06 ENCOUNTER — Other Ambulatory Visit: Payer: Self-pay

## 2018-11-06 DIAGNOSIS — F331 Major depressive disorder, recurrent, moderate: Secondary | ICD-10-CM | POA: Diagnosis not present

## 2018-11-06 DIAGNOSIS — F411 Generalized anxiety disorder: Secondary | ICD-10-CM

## 2018-11-06 DIAGNOSIS — G4700 Insomnia, unspecified: Secondary | ICD-10-CM | POA: Diagnosis not present

## 2018-11-06 DIAGNOSIS — F250 Schizoaffective disorder, bipolar type: Secondary | ICD-10-CM

## 2018-11-06 DIAGNOSIS — F319 Bipolar disorder, unspecified: Secondary | ICD-10-CM

## 2018-11-06 NOTE — Progress Notes (Signed)
Chelsea [name] [id number] 05-Jan-1971 48 y.o.  Subjective:   Patient ID:  Chelsea BostonJulissa Hartung is a 48 y.o. (DOB 05-Jan-1971) female.  Chief Complaint:  No chief complaint on file.   HPI   Chelsea [name] presents to the office today for follow-up of Bipolar disorder, schizoaffective disorder, insomnia, and anxiety.  Describes mood today as "ok". Pleasant. Mood symptoms - denies depression, anxiety, and irritability. Stating "I'm doing ok". Has tolerated switch from Risperdal to Vraylar - in week 3 currently. Reporting "less" breast discharge. Accompanied by father - not present for interview. Going outside during the day to smoke. Stable interest and motivation. Reports taking medications as prescribed. Energy levels stable. Active, does not have a regular exercise routine. Getting out some days - grocery store. Watching TV.  Appetite adequate. Weight stable. Sleeping better some nights than others. Averages 6 to 7 hours. Denies daytime napping. Focus and concentration stable. Completing tasks. Managing aspects of household. Disabled.  Denies SI or HI. Denies AH or VH.  Review of Systems:  Review of Systems  Musculoskeletal: Negative for gait problem.  Neurological: Negative for tremors.  Psychiatric/Behavioral:       Please refer to HPI    Medications: I have reviewed the patient's current medications.  Current Outpatient Medications  Medication Sig Dispense Refill  . amLODipine (NORVASC) 10 MG tablet Take 1 tablet (10 mg total) by mouth daily. 30 tablet 6  . atorvastatin (LIPITOR) 20 MG tablet Take 1 tablet (20 mg total) by mouth at bedtime. 90 tablet 1  . benztropine (COGENTIN) 0.5 MG tablet Take 1 tablet (0.5 mg total) by mouth 2 (two) times daily. For prevention of drug induced tremors 60 tablet 0  . cariprazine (VRAYLAR) capsule Take 1 capsule (3 mg total) by mouth daily. For depression 28 capsule 0  . cloNIDine (CATAPRES) 0.1 MG tablet Take 1 tablet (0.1 mg total) by mouth 2  (two) times daily. For high blood pressure 60 tablet 0  . nicotine polacrilex (NICORETTE) 2 MG gum Take 1 each (2 mg total) by mouth as needed for smoking cessation. (May but from over the counter): For smoking cessation (Patient not taking: Reported on 07/15/2018) 100 tablet 0  . risperiDONE (RISPERDAL) 2 MG tablet Take 1 tablet (2 mg total) by mouth 2 (two) times daily. 60 tablet 2  . traZODone (DESYREL) 50 MG tablet Take 1 tablet (50 mg total) by mouth at bedtime. 30 tablet 2   No current facility-administered medications for this visit.     Medication Side Effects: None  Allergies:  Allergies  Allergen Reactions  . Divalproex Sodium Er Other (See Comments)    Delirium with hyperammonemia  . Aspirin     REACTION: swelling  . Fish Allergy     Past Medical History:  Diagnosis Date  . Allergy   . Bipolar disorder (HCC)   . Depression   . Eczema   . Elevated cholesterol   . Hypertension   . Schizophrenia (HCC)     Family History  Problem Relation Age of Onset  . Breast cancer Mother   . Hypertension Father   . Diabetes Paternal Grandmother   . Colon cancer Neg Hx   . CAD Neg Hx     Social History   Socioeconomic History  . Marital status: Divorced    Spouse name: Not on file  . Number of children: Not on file  . Years of education: Not on file  . Highest education level: Not on file  Occupational History  .  Occupation: disability   Social Needs  . Financial resource strain: Not on file  . Food insecurity    Worry: Not on file    Inability: Not on file  . Transportation needs    Medical: Not on file    Non-medical: Not on file  Tobacco Use  . Smoking status: Current Every Day Smoker    Packs/day: 1.50    Types: Cigarettes  . Smokeless tobacco: Never Used  Substance and Sexual Activity  . Alcohol use: Yes    Frequency: Never    Comment:  socially , rare   . Drug use: No  . Sexual activity: Not Currently  Lifestyle  . Physical activity    Days per  week: Not on file    Minutes per session: Not on file  . Stress: Not on file  Relationships  . Social Musician on phone: Not on file    Gets together: Not on file    Attends religious service: Not on file    Active member of club or organization: Not on file    Attends meetings of clubs or organizations: Not on file    Relationship status: Not on file  . Intimate partner violence    Fear of current or ex partner: Not on file    Emotionally abused: Not on file    Physically abused: Not on file    Forced sexual activity: Not on file  Other Topics Concern  . Not on file  Social History Narrative   [name]   Lives w/ parents    Father Annisten [place]     Past Medical History, Surgical history, Social history, and Family history were reviewed and updated as appropriate.   Please see review of systems for further details on the patient's review from today.   Objective:   Physical Exam:  There were no vitals taken for this visit.  Physical Exam Constitutional:      General: She is not in acute distress.    Appearance: She is well-developed.  Musculoskeletal:        General: No deformity.  Neurological:     Mental Status: She is alert and oriented to person, place, and time.     Coordination: Coordination normal.  Psychiatric:        Attention and Perception: Attention and perception normal. She does not perceive auditory or visual hallucinations.        Mood and Affect: Mood normal. Mood is not anxious or depressed. Affect is not labile, blunt, angry or inappropriate.        Speech: Speech normal.        Behavior: Behavior normal.        Thought Content: Thought content normal. Thought content is not paranoid or delusional. Thought content does not include homicidal or suicidal ideation. Thought content does not include homicidal or suicidal plan.        Cognition and Memory: Cognition and memory normal.        Judgment: Judgment normal.     Comments: Insight intact     Lab Review:     Component Value Date/Time   NA 136 07/15/2018 0743   K 3.4 (L) 07/15/2018 0743   CL 103 07/15/2018 0743   CO2 25 07/15/2018 0743   GLUCOSE 89 07/15/2018 0743   BUN 11 07/15/2018 0743   CREATININE 0.72 07/15/2018 0743   CALCIUM 8.9 07/15/2018 0743   PROT 7.0 07/15/2018 0743   ALBUMIN 3.8 07/15/2018 0743  AST 29 07/15/2018 0743   ALT 40 07/15/2018 0743   ALKPHOS 91 07/15/2018 0743   BILITOT 0.8 07/15/2018 0743   GFRNONAA >60 07/15/2018 0743   GFRAA >60 07/15/2018 0743       Component Value Date/Time   WBC 7.4 07/15/2018 0743   RBC 4.57 07/15/2018 0743   HGB 14.1 07/15/2018 0743   HCT 42.6 07/15/2018 0743   PLT 322 07/15/2018 0743   MCV 93.2 07/15/2018 0743   MCH 30.9 07/15/2018 0743   MCHC 33.1 07/15/2018 0743   RDW 14.0 07/15/2018 0743   LYMPHSABS 2.0 07/15/2018 0743   MONOABS 0.6 07/15/2018 0743   EOSABS 0.3 07/15/2018 0743   BASOSABS 0.0 07/15/2018 0743    No results found for: POCLITH, LITHIUM   No results found for: PHENYTOIN, PHENOBARB, VALPROATE, CBMZ   Assessment: Plan:    Plan:  1. Benztropine 0.5mg  BID 2. Clonidine 0.1mg  BID 3. Vraylar 3mg  daily - continue taper  4. Risperdal 2mg  BID - continue taper 5. Trazadone 50mg  at hs  Received Prolactin results - will plan to discontinue Risperdal - will decrease Risperdal by one milligram a week and escalate Vraylar to 4.5/6mg  daily as tolerated.   Recheck Prolactin level next visit  RTC 4 weeks  Patient advised to contact office with any questions, adverse effects, or acute worsening in signs and symptoms.  Discussed potential metabolic side effects associated with atypical antipsychotics, as well as potential risk for movement side effects. Advised pt to contact office if movement side effects occur.   Diagnoses and all orders for this visit:  Schizoaffective disorder, bipolar type ([place])  Insomnia, unspecified type  Bipolar I disorder ([place])  Generalized anxiety  disorder  Major depressive disorder, recurrent episode, moderate ([place])     Please see After Visit Summary for patient specific instructions.  Future Appointments  Date Time Provider [place]  11/13/2018  9:20 AM Emelyn Roen, Berdie Ogren, NP [facility] None  01/08/2019  9:00 AM Colon Branch, MD [facility]    No orders of the defined types were placed in this encounter.   -------------------------------

## 2018-11-08 ENCOUNTER — Encounter: Payer: Self-pay | Admitting: Internal Medicine

## 2018-11-13 ENCOUNTER — Ambulatory Visit: Payer: Medicare HMO | Admitting: Adult Health

## 2018-11-27 ENCOUNTER — Ambulatory Visit (INDEPENDENT_AMBULATORY_CARE_PROVIDER_SITE_OTHER): Payer: Medicare HMO | Admitting: Adult Health

## 2018-11-27 ENCOUNTER — Other Ambulatory Visit: Payer: Self-pay

## 2018-11-27 ENCOUNTER — Encounter: Payer: Self-pay | Admitting: Adult Health

## 2018-11-27 DIAGNOSIS — F411 Generalized anxiety disorder: Secondary | ICD-10-CM

## 2018-11-27 DIAGNOSIS — F319 Bipolar disorder, unspecified: Secondary | ICD-10-CM | POA: Diagnosis not present

## 2018-11-27 DIAGNOSIS — G4700 Insomnia, unspecified: Secondary | ICD-10-CM | POA: Diagnosis not present

## 2018-11-27 DIAGNOSIS — F331 Major depressive disorder, recurrent, moderate: Secondary | ICD-10-CM | POA: Diagnosis not present

## 2018-11-27 DIAGNOSIS — F250 Schizoaffective disorder, bipolar type: Secondary | ICD-10-CM

## 2018-11-27 NOTE — Progress Notes (Signed)
Chelsea [name] [id number] [birth year] 48 y.o.  Subjective:   Patient ID:  Chelsea [name] is a 48 y.o. (DOB 1970-08-08) female.  Chief Complaint:  No chief complaint on file.   HPI   Chelsea [name] presents to the office today for follow-up of Bipolar disorder, schizoaffective disorder, insomnia, and anxiety.  Describes mood today as - Mood symptoms - depression, anxiety, and irritability. Stating "I'm doing much better". Mood is "level" - denies any "highs or lows". Stopped taking Risperdal 2 weeks ago. Denies any breast discharge since stopping the Risperdal. Stating "I don't have anything to do". Also stating "the coronavirus is the problem". Stable interest and motivation. Taking medications as prescribed.  Energy levels stable. Active, does not have a regular exercise routine. Unemployed. Enjoys some usual interests and activities. Lives at home. Spending time with family. Visiting Aunt during the day. Appetite adequate. Weight stable - 189. Sleeps well most nights. Averages 8 or more hours. Focus and concentration stable. Completing tasks. Managing aspects of household. Reading Denies SI or HI. Denies AH or VH.  Review of Systems:  Review of Systems  Neurological: Negative for tremors and weakness.  Psychiatric/Behavioral: Negative for confusion.       Please refer to HPI  All other systems reviewed and are negative.  Medications: I have reviewed the patient's current medications.  Current Outpatient Medications  Medication Sig Dispense Refill  . amLODipine (NORVASC) 10 MG tablet Take 1 tablet (10 mg total) by mouth daily. 30 tablet 6  . atorvastatin (LIPITOR) 20 MG tablet Take 1 tablet (20 mg total) by mouth at bedtime. 90 tablet 1  . benztropine (COGENTIN) 0.5 MG tablet Take 1 tablet (0.5 mg total) by mouth 2 (two) times daily. For prevention of drug induced tremors 60 tablet 0  . cariprazine (VRAYLAR) capsule Take 1 capsule (3 mg total) by mouth daily. For depression 28  capsule 0  . cloNIDine (CATAPRES) 0.1 MG tablet Take 1 tablet (0.1 mg total) by mouth 2 (two) times daily. For high blood pressure 60 tablet 0  . nicotine polacrilex (NICORETTE) 2 MG gum Take 1 each (2 mg total) by mouth as needed for smoking cessation. (May but from over the counter): For smoking cessation (Patient not taking: Reported on 07/15/2018) 100 tablet 0  . risperiDONE (RISPERDAL) 2 MG tablet Take 1 tablet (2 mg total) by mouth 2 (two) times daily. 60 tablet 2  . traZODone (DESYREL) 50 MG tablet Take 1 tablet (50 mg total) by mouth at bedtime. 30 tablet 2   No current facility-administered medications for this visit.     Medication Side Effects: None  Allergies:  Allergies  Allergen Reactions  . Divalproex Sodium Er Other (See Comments)    Delirium with hyperammonemia  . Aspirin     REACTION: swelling  . Fish Allergy     Past Medical History:  Diagnosis Date  . Allergy   . Bipolar disorder ([place])   . Depression   . Eczema   . Elevated cholesterol   . Hypertension   . Schizophrenia ([place])     Family History  Problem Relation Age of Onset  . Breast cancer Mother   . Hypertension Father   . Diabetes Paternal Grandmother   . Colon cancer Neg Hx   . CAD Neg Hx     Social History   Socioeconomic History  . Marital status: Divorced    Spouse name: Not on file  . Number of children: Not on file  . Years of education:  Not on file  . Highest education level: Not on file  Occupational History  . Occupation: disability   Social Needs  . Financial resource strain: Not on file  . Food insecurity    Worry: Not on file    Inability: Not on file  . Transportation needs    Medical: Not on file    Non-medical: Not on file  Tobacco Use  . Smoking status: Current Every Day Smoker    Packs/day: 1.50    Types: Cigarettes  . Smokeless tobacco: Never Used  Substance and Sexual Activity  . Alcohol use: Yes    Frequency: Never    Comment:  socially , rare   . Drug use:  No  . Sexual activity: Not Currently  Lifestyle  . Physical activity    Days per week: Not on file    Minutes per session: Not on file  . Stress: Not on file  Relationships  . Social Musicianconnections    Talks on phone: Not on file    Gets together: Not on file    Attends religious service: Not on file    Active member of club or organization: Not on file    Attends meetings of clubs or organizations: Not on file    Relationship status: Not on file  . Intimate partner violence    Fear of current or ex partner: Not on file    Emotionally abused: Not on file    Physically abused: Not on file    Forced sexual activity: Not on file  Other Topics Concern  . Not on file  Social History Narrative   [name]   Lives w/ parents    Father Hardie PulleyMiguel Siebels     Past Medical History, Surgical history, Social history, and Family history were reviewed and updated as appropriate.   Please see review of systems for further details on the patient's review from today.   Objective:   Physical Exam:  There were no vitals taken for this visit.  Physical Exam Constitutional:      General: She is not in acute distress.    Appearance: She is well-developed.  Musculoskeletal:        General: No deformity.  Neurological:     Mental Status: She is alert and oriented to person, place, and time.     Coordination: Coordination normal.  Psychiatric:        Attention and Perception: Attention and perception normal. She does not perceive auditory or visual hallucinations.        Mood and Affect: Mood normal. Mood is not anxious or depressed. Affect is not labile, blunt, angry or inappropriate.        Speech: Speech normal.        Behavior: Behavior normal.        Thought Content: Thought content normal. Thought content is not paranoid or delusional. Thought content does not include homicidal or suicidal ideation. Thought content does not include homicidal or suicidal plan.        Cognition and Memory: Cognition  and memory normal.        Judgment: Judgment normal.     Comments: Insight intact    Lab Review:     Component Value Date/Time   NA 136 07/15/2018 0743   K 3.4 (L) 07/15/2018 0743   CL 103 07/15/2018 0743   CO2 25 07/15/2018 0743   GLUCOSE 89 07/15/2018 0743   BUN 11 07/15/2018 0743   CREATININE 0.72 07/15/2018 0743  CALCIUM 8.9 07/15/2018 0743   PROT 7.0 07/15/2018 0743   ALBUMIN 3.8 07/15/2018 0743   AST 29 07/15/2018 0743   ALT 40 07/15/2018 0743   ALKPHOS 91 07/15/2018 0743   BILITOT 0.8 07/15/2018 0743   GFRNONAA >60 07/15/2018 0743   GFRAA >60 07/15/2018 0743       Component Value Date/Time   WBC 7.4 07/15/2018 0743   RBC 4.57 07/15/2018 0743   HGB 14.1 07/15/2018 0743   HCT 42.6 07/15/2018 0743   PLT 322 07/15/2018 0743   MCV 93.2 07/15/2018 0743   MCH 30.9 07/15/2018 0743   MCHC 33.1 07/15/2018 0743   RDW 14.0 07/15/2018 0743   LYMPHSABS 2.0 07/15/2018 0743   MONOABS 0.6 07/15/2018 0743   EOSABS 0.3 07/15/2018 0743   BASOSABS 0.0 07/15/2018 0743    No results found for: POCLITH, LITHIUM   No results found for: PHENYTOIN, PHENOBARB, VALPROATE, CBMZ   Assessment: Plan:    Plan:  Benztropine 0.5mg  BID Clonidine 0.1mg  BID Vraylar 6mg  daily   Trazadone 50mg  at hs - patient has stopped   Recheck Prolactin level next visit - stopped Risperdal 2 weeks ago. Noting breast discharge has stopped.  RTC 4 weeks  Patient advised to contact office with any questions, adverse effects, or acute worsening in signs and symptoms.  Discussed potential metabolic side effects associated with atypical antipsychotics, as well as potential risk for movement side effects. Advised pt to contact office if movement side effects occur.   Diagnoses and all orders for this visit:  Schizoaffective disorder, bipolar type (HCC)  Insomnia, unspecified type  Bipolar I disorder (HCC)  Major depressive disorder, recurrent episode, moderate (HCC)  Generalized anxiety  disorder     Please see After Visit Summary for patient specific instructions.  Future Appointments  Date Time Provider Department Center  01/08/2019  9:00 AM , MD [facility]    No orders of the defined types were placed in this encounter.   -------------------------------

## 2018-12-12 ENCOUNTER — Other Ambulatory Visit: Payer: Self-pay

## 2018-12-12 ENCOUNTER — Encounter: Payer: Self-pay | Admitting: Adult Health

## 2018-12-12 ENCOUNTER — Ambulatory Visit (INDEPENDENT_AMBULATORY_CARE_PROVIDER_SITE_OTHER): Payer: Medicare HMO | Admitting: Adult Health

## 2018-12-12 DIAGNOSIS — F250 Schizoaffective disorder, bipolar type: Secondary | ICD-10-CM

## 2018-12-12 DIAGNOSIS — F411 Generalized anxiety disorder: Secondary | ICD-10-CM | POA: Diagnosis not present

## 2018-12-12 DIAGNOSIS — Z79899 Other long term (current) drug therapy: Secondary | ICD-10-CM | POA: Diagnosis not present

## 2018-12-12 DIAGNOSIS — F319 Bipolar disorder, unspecified: Secondary | ICD-10-CM | POA: Diagnosis not present

## 2018-12-12 DIAGNOSIS — F331 Major depressive disorder, recurrent, moderate: Secondary | ICD-10-CM

## 2018-12-12 DIAGNOSIS — G4700 Insomnia, unspecified: Secondary | ICD-10-CM | POA: Diagnosis not present

## 2018-12-12 NOTE — Progress Notes (Signed)
Chelsea [name] [id number] Aug 10, 1970 48 y.o.  Subjective:   Patient ID:  Chelsea [name] is a 48 y.o. (DOB 1971/01/02) female.  Chief Complaint:  No chief complaint on file.   HPI   Chelsea [name] presents to the office today for follow-up of Bipolar disorder, schizoaffective disorder, insomnia, and anxiety.  Describes mood today as "ok". Pleasant. Flat. Mood symptoms - denies depression, anxiety, and irritability. Stating "I'm doing good". Mood has remained level - "no ups or downs". Tolerating Vraylar - "no problems". Going to [employer] everyday. Mother feels like she isn't "social" enough. 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 Computer having issues - "I need to get it fixed". Getting out most days. Buying groceries with family. Denies breast discharge. Stopped taking Risperdal 6 weeks ago. Stable interest and motivation. Taking medications as prescribed.  Energy levels stable. Active, does not have a regular exercise routine. Unemployed. Disabled.  Enjoys some usual interests and activities. Lives at home. Spending time with family. Visiting Aunt during the day. Appetite adequate. Weight stable - 189. Sleeps well most nights. Averages 8 hours. Naps "sometimes" during the day. Focus and concentration stable. Completing tasks. Managing aspects of household. Working part-time - 4 hours a day - Designer, television/film set.  Denies SI or HI. Denies AH or VH.  Review of Systems:  Review of Systems  Musculoskeletal: Negative for gait problem.  Neurological: Negative for tremors, weakness and headaches.  Psychiatric/Behavioral: Negative for agitation, hallucinations, sleep disturbance and suicidal ideas. The patient is not nervous/anxious.        Please refer to HPI  All other systems reviewed and are negative.  Medications: I have reviewed the patient's current medications.  Current Outpatient Medications  Medication Sig Dispense Refill  . amLODipine (NORVASC) 10 MG tablet Take 1 tablet (10 mg total) by  mouth daily. 30 tablet 6  . atorvastatin (LIPITOR) 20 MG tablet Take 1 tablet (20 mg total) by mouth at bedtime. 90 tablet 1  . benztropine (COGENTIN) 0.5 MG tablet Take 1 tablet (0.5 mg total) by mouth 2 (two) times daily. For prevention of drug induced tremors 60 tablet 0  . cariprazine (VRAYLAR) capsule Take 1 capsule (3 mg total) by mouth daily. For depression 28 capsule 0  . cloNIDine (CATAPRES) 0.1 MG tablet Take 1 tablet (0.1 mg total) by mouth 2 (two) times daily. For high blood pressure 60 tablet 0  . nicotine polacrilex (NICORETTE) 2 MG gum Take 1 each (2 mg total) by mouth as needed for smoking cessation. (May but from over the counter): For smoking cessation (Patient not taking: Reported on 07/15/2018) 100 tablet 0  . risperiDONE (RISPERDAL) 2 MG tablet Take 1 tablet (2 mg total) by mouth 2 (two) times daily. 60 tablet 2  . traZODone (DESYREL) 50 MG tablet Take 1 tablet (50 mg total) by mouth at bedtime. 30 tablet 2   No current facility-administered medications for this visit.     Medication Side Effects: None  Allergies:  Allergies  Allergen Reactions  . Divalproex Sodium Er Other (See Comments)    Delirium with hyperammonemia  . Aspirin     REACTION: swelling  . Fish Allergy     Past Medical History:  Diagnosis Date  . Allergy   . Bipolar disorder ([place])   . Depression   . Eczema   . Elevated cholesterol   . Hypertension   . Schizophrenia ([place])     Family History  Problem Relation Age of Onset  . Breast cancer Mother   .  Hypertension Father   . Diabetes Paternal Grandmother   . Colon cancer Neg Hx   . CAD Neg Hx     Social History   Socioeconomic History  . Marital status: Divorced    Spouse name: Not on file  . Number of children: Not on file  . Years of education: Not on file  . Highest education level: Not on file  Occupational History  . Occupation: disability   Social Needs  . Financial resource strain: Not on file  . Food insecurity    Worry:  Not on file    Inability: Not on file  . Transportation needs    Medical: Not on file    Non-medical: Not on file  Tobacco Use  . Smoking status: Current Every Day Smoker    Packs/day: 1.50    Types: Cigarettes  . Smokeless tobacco: Never Used  Substance and Sexual Activity  . Alcohol use: Yes    Frequency: Never    Comment:  socially , rare   . Drug use: No  . Sexual activity: Not Currently  Lifestyle  . Physical activity    Days per week: Not on file    Minutes per session: Not on file  . Stress: Not on file  Relationships  . Social Musician on phone: Not on file    Gets together: Not on file    Attends religious service: Not on file    Active member of club or organization: Not on file    Attends meetings of clubs or organizations: Not on file    Relationship status: Not on file  . Intimate partner violence    Fear of current or ex partner: Not on file    Emotionally abused: Not on file    Physically abused: Not on file    Forced sexual activity: Not on file  Other Topics Concern  . Not on file  Social History Narrative   [name]   Lives w/ parents    Father Inell Mimbs     Past Medical History, Surgical history, Social history, and Family history were reviewed and updated as appropriate.   Please see review of systems for further details on the patient's review from today.   Objective:   Physical Exam:  There were no vitals taken for this visit.  Physical Exam Constitutional:      General: She is not in acute distress.    Appearance: She is well-developed.  Musculoskeletal:        General: No deformity.  Neurological:     Mental Status: She is alert and oriented to person, place, and time.     Coordination: Coordination normal.  Psychiatric:        Attention and Perception: Attention and perception normal. She does not perceive auditory or visual hallucinations.        Mood and Affect: Mood normal. Mood is not anxious or depressed. Affect  is not labile, blunt, angry or inappropriate.        Speech: Speech normal.        Behavior: Behavior normal.        Thought Content: Thought content normal. Thought content is not paranoid or delusional. Thought content does not include homicidal or suicidal ideation. Thought content does not include homicidal or suicidal plan.        Cognition and Memory: Cognition and memory normal.        Judgment: Judgment normal.     Comments: Insight intact  Lab Review:     Component Value Date/Time   NA 136 07/15/2018 0743   K 3.4 (L) 07/15/2018 0743   CL 103 07/15/2018 0743   CO2 25 07/15/2018 0743   GLUCOSE 89 07/15/2018 0743   BUN 11 07/15/2018 0743   CREATININE 0.72 07/15/2018 0743   CALCIUM 8.9 07/15/2018 0743   PROT 7.0 07/15/2018 0743   ALBUMIN 3.8 07/15/2018 0743   AST 29 07/15/2018 0743   ALT 40 07/15/2018 0743   ALKPHOS 91 07/15/2018 0743   BILITOT 0.8 07/15/2018 0743   GFRNONAA >60 07/15/2018 0743   GFRAA >60 07/15/2018 0743       Component Value Date/Time   WBC 7.4 07/15/2018 0743   RBC 4.57 07/15/2018 0743   HGB 14.1 07/15/2018 0743   HCT 42.6 07/15/2018 0743   PLT 322 07/15/2018 0743   MCV 93.2 07/15/2018 0743   MCH 30.9 07/15/2018 0743   MCHC 33.1 07/15/2018 0743   RDW 14.0 07/15/2018 0743   LYMPHSABS 2.0 07/15/2018 0743   MONOABS 0.6 07/15/2018 0743   EOSABS 0.3 07/15/2018 0743   BASOSABS 0.0 07/15/2018 0743    No results found for: POCLITH, LITHIUM   No results found for: PHENYTOIN, PHENOBARB, VALPROATE, CBMZ   Assessment: Plan:    Plan:  Benztropine 0.5mg  BID Clonidine 0.1mg  BID Vraylar 6mg  daily   Check Prolactin level - Risperdal discontinued 6 weeks ago. Noting breast discharge has stopped.  RTC 4 weeks  Patient advised to contact office with any questions, adverse effects, or acute worsening in signs and symptoms.  Discussed potential metabolic side effects associated with atypical antipsychotics, as well as potential risk for movement  side effects. Advised pt to contact office if movement side effects occur.   Diagnoses and all orders for this visit:  High risk medication use  Generalized anxiety disorder  Major depressive disorder, recurrent episode, moderate (HCC)  Bipolar I disorder (HCC)  Insomnia, unspecified type  Schizoaffective disorder, bipolar type (HCC)     Please see After Visit Summary for patient specific instructions.  Future Appointments  Date Time Provider Department Center  01/08/2019  9:00 AM Wanda PlumpPaz, Jose E, MD [facility]    No orders of the defined types were placed in this encounter.   -------------------------------

## 2018-12-25 ENCOUNTER — Ambulatory Visit: Payer: Medicare HMO | Admitting: Adult Health

## 2019-01-08 ENCOUNTER — Other Ambulatory Visit: Payer: Self-pay

## 2019-01-08 ENCOUNTER — Ambulatory Visit (INDEPENDENT_AMBULATORY_CARE_PROVIDER_SITE_OTHER): Payer: Medicare HMO | Admitting: Internal Medicine

## 2019-01-08 VITALS — BP 133/96 | HR 80 | Temp 96.1°F | Resp 12 | Ht 66.0 in | Wt 190.4 lb

## 2019-01-08 DIAGNOSIS — I10 Essential (primary) hypertension: Secondary | ICD-10-CM | POA: Diagnosis not present

## 2019-01-08 DIAGNOSIS — L309 Dermatitis, unspecified: Secondary | ICD-10-CM

## 2019-01-08 MED ORDER — CLONIDINE HCL 0.2 MG PO TABS: 0.2000 mg | ORAL_TABLET | Freq: Two times a day (BID) | ORAL | 1 refills | Status: DC

## 2019-01-08 NOTE — Patient Instructions (Addendum)
   GO TO THE FRONT DESK Schedule your next appointment for a checkup in 4  months  Increase clonidine to 0.2 mg, 1 tablet twice a day  Please check your blood pressure at home, if you cannot, when you go to see the behavioral health doctor asked them  to check.  BP GOAL is between 110/65 and  135/85. If it is consistently higher or lower, let me know  Call Dr. Quincy Simmonds office, [phone] and schedule a appointment for a Pap smear.    Go to the first floor and schedule your mammogram

## 2019-01-08 NOTE — Progress Notes (Signed)
Subjective:    Patient ID: Chelsea [name], female    DOB: [birth year], 48 y.o.   MRN: [id number]  DOS:  01/08/2019 Type of visit - description: Routine office visit HTN: Good medication compliance, no ambulatory BPs Did not follow-up with gynecology or had a mammogram but is ready to do that. Eczema: Has a long history of eczema at the hands, request a prescription for topical steroid which typically help.  BP Readings from Last 3 Encounters:  01/08/19 (!) 133/96  07/15/18 130/90  07/13/18 (!) 132/98      Review of Systems Denies chest pain no difficulty breathing No lower extremity edema No headache or dizziness   Past Medical History:  Diagnosis Date  . Allergy   . Bipolar disorder ([place])   . Depression   . Eczema   . Elevated cholesterol   . Hypertension   . Schizophrenia [facility])     Past Surgical History:  Procedure Laterality Date  . NO PAST SURGERIES      Social History   Socioeconomic History  . Marital status: Divorced    Spouse name: Not on file  . Number of children: Not on file  . Years of education: Not on file  . Highest education level: Not on file  Occupational History  . Occupation: disability   Tobacco Use  . Smoking status: Current Every Day Smoker    Packs/day: 1.50    Types: Cigarettes  . Smokeless tobacco: Never Used  Substance and Sexual Activity  . Alcohol use: Yes    Comment:  socially , rare   . Drug use: No  . Sexual activity: Not Currently  Other Topics Concern  . Not on file  Social History Narrative   [name]   Lives w/ parents    Father Anniya Whiters    Social Determinants of Health   Financial Resource Strain:   . Difficulty of Paying Living Expenses: Not on file  Food Insecurity:   . Worried About Charity fundraiser in the Last Year: Not on file  . Ran Out of Food in the Last Year: Not on file  Transportation Needs:   . Lack of Transportation (Medical): Not on file  . Lack of Transportation (Non-Medical): Not on  file  Physical Activity:   . Days of Exercise per Week: Not on file  . Minutes of Exercise per Session: Not on file  Stress:   . Feeling of Stress : Not on file  Social Connections:   . Frequency of Communication with Friends and Family: Not on file  . Frequency of Social Gatherings with Friends and Family: Not on file  . Attends Religious Services: Not on file  . Active Member of Clubs or Organizations: Not on file  . Attends Archivist Meetings: Not on file  . Marital Status: Not on file  Intimate Partner Violence:   . Fear of Current or Ex-Partner: Not on file  . Emotionally Abused: Not on file  . Physically Abused: Not on file  . Sexually Abused: Not on file      Allergies as of 01/08/2019      Reactions   Divalproex Sodium Er Other (See Comments)   Delirium with hyperammonemia   Aspirin    REACTION: swelling   Fish Allergy       Medication List       Accurate as of January 08, 2019 11:59 PM. If you have any questions, ask your nurse or doctor.  STOP taking these medications   nicotine polacrilex 2 MG gum Commonly known as: NICORETTE Stopped by: Willow Ora, MD   risperiDONE 2 MG tablet Commonly known as: RISPERDAL Stopped by: Willow Ora, MD   traZODone 50 MG tablet Commonly known as: DESYREL Stopped by: Willow Ora, MD     TAKE these medications   amLODipine 10 MG tablet Commonly known as: NORVASC Take 1 tablet (10 mg total) by mouth daily.   atorvastatin 20 MG tablet Commonly known as: LIPITOR Take 1 tablet (20 mg total) by mouth at bedtime.   benztropine 0.5 MG tablet Commonly known as: COGENTIN Take 1 tablet (0.5 mg total) by mouth 2 (two) times daily. For prevention of drug induced tremors   cloNIDine 0.2 MG tablet Commonly known as: CATAPRES Take 1 tablet (0.2 mg total) by mouth 2 (two) times daily. What changed:   medication strength  how much to take  additional instructions Changed by: Willow Ora, MD   Vraylar 4.5 MG  Caps Generic drug: Cariprazine HCl Take 1 capsule by mouth daily. What changed: Another medication with the same name was removed. Continue taking this medication, and follow the directions you see here. Changed by: Willow Ora, MD   Vraylar capsule Generic drug: cariprazine Take 1.5 mg by mouth daily. What changed: Another medication with the same name was removed. Continue taking this medication, and follow the directions you see here. Changed by: Willow Ora, MD           Objective:   Physical Exam BP (!) 133/96 (BP Location: Right Arm, Cuff Size: Large)   Pulse 80   Temp (!) 96.1 F (35.6 C) (Temporal)   Resp 12   Ht 5\' 6"  (1.676 m)   Wt 190 lb 6.4 oz (86.4 kg)   SpO2 100%   BMI 30.73 kg/m  General:   Well developed, NAD, BMI noted. HEENT:  Normocephalic . Face symmetric, atraumatic Lungs:  CTA B Normal respiratory effort, no intercostal retractions, no accessory muscle use. Heart: RRR,  no murmur.  No pretibial edema bilaterally  Skin: Hands with patches of erythema, scaliness and small blisters Neurologic:  alert & oriented X3.  Speech normal, gait appropriate for age and unassisted Psych--  Cognition and judgment appear intact.  Cooperative with normal attention span and concentration.  Behavior appropriate. No anxious or depressed appearing.      Assessment     Assessment HTN Hyperlipidemia Bipolar disorder; schizophrenia (dx as teenager); seen in [place] Eczema. [place] [name] Derm) Tobacco abuse  Admitted 11-2017 s/i, h/i   PLAN: HTN: Since the last visit, amlodipine was increased to 10 mg, no ambulatory BPs, BP today is 133/96.  No apparent side effects. Plan: Continue amlodipine, increase clonidine to 0.2 mg twice a day, goal is to get DBP better.. Recommend to monitor BPs.  Follow-up 6 months Eczema: (Hands) a chronic issue, previously, steroids helped, Diprolene sent. Preventive care discussed RTC 4 months, CPX   This visit occurred during the  SARS-CoV-2 public health emergency.  Safety protocols were in place, including screening questions prior to the visit, additional usage of staff PPE, and extensive cleaning of exam room while observing appropriate contact time as indicated for disinfecting solutions.

## 2019-01-09 ENCOUNTER — Encounter: Payer: Self-pay | Admitting: Adult Health

## 2019-01-09 ENCOUNTER — Other Ambulatory Visit: Payer: Self-pay

## 2019-01-09 ENCOUNTER — Ambulatory Visit (INDEPENDENT_AMBULATORY_CARE_PROVIDER_SITE_OTHER): Payer: Medicare HMO | Admitting: Adult Health

## 2019-01-09 DIAGNOSIS — Z79899 Other long term (current) drug therapy: Secondary | ICD-10-CM | POA: Diagnosis not present

## 2019-01-09 NOTE — Progress Notes (Signed)
Chelsea [name] [id number] 27-May-1970 48 y.o.  Subjective:   Patient ID:  Chelsea [name] is a 48 y.o. (DOB November 02, 1970) female.  Chief Complaint:  Chief Complaint  Patient presents with  . Anxiety  . Insomnia  . Schizophrenia  . Other    BPD    HPI [name] presents to the office today for follow-up of Bipolar disorder, schizoaffective disorder, insomnia, and anxiety.  Describes mood today as "ok". Pleasant. Flat. Mood symptoms - denies depression, anxiety, and irritability. Stating "I'm ok". Denies any mood instability Doing "well" with Vraylar. Denies breast discharge. Did not get Prolactin level from last visit - plans to go today. Plans to spend New Year's with family. Stable interest and motivation. Taking medications as prescribed.  Energy levels stable. Active, does not have a regular exercise routine. Disabled.  Enjoys some usual interests and activities. Lives at home. Spending time with family. Jeani Sow out most days. Visiting with family.  Appetite adequate. Weight stable - 190. Sleeps well most nights. Averages 6 to 8 hours. Napping during the day. Focus and concentration stable. Completing tasks. Managing aspects of household. 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  Denies SI or HI. Denies AH or VH.  PHQ2-9     Office Visit from 03/08/2018 in [facility] at [place] Visit from 11/29/2017 in [place] at [place]  PHQ-2 Total Score  0  0  PHQ-9 Total Score  4  4       Review of Systems:  Review of Systems  Musculoskeletal: Negative for gait problem.  Neurological: Negative for tremors.  Psychiatric/Behavioral:       Please refer to HPI    Medications: I have reviewed the patient's current medications.  Current Outpatient Medications  Medication Sig Dispense Refill  . amLODipine (NORVASC) 10 MG tablet Take 1 tablet (10 mg total) by mouth daily. 30 tablet 6  .  atorvastatin (LIPITOR) 20 MG tablet Take 1 tablet (20 mg total) by mouth at bedtime. 90 tablet 1  . benztropine (COGENTIN) 0.5 MG tablet Take 1 tablet (0.5 mg total) by mouth 2 (two) times daily. For prevention of drug induced tremors 60 tablet 0  . cariprazine (VRAYLAR) capsule Take 1.5 mg by mouth daily.    . Cariprazine HCl (VRAYLAR) 4.5 MG CAPS Take 1 capsule by mouth daily.    . cloNIDine (CATAPRES) 0.2 MG tablet Take 1 tablet (0.2 mg total) by mouth 2 (two) times daily. 180 tablet 1   No current facility-administered medications for this visit.    Medication Side Effects: None  Allergies:  Allergies  Allergen Reactions  . Divalproex Sodium Er Other (See Comments)    Delirium with hyperammonemia  . Aspirin     REACTION: swelling  . Fish Allergy     Past Medical History:  Diagnosis Date  . Allergy   . Bipolar disorder ([place])   . Depression   . Eczema   . Elevated cholesterol   . Hypertension   . Schizophrenia ([place])     Family History  Problem Relation Age of Onset  . Breast cancer Mother   . Hypertension Father   . Diabetes Paternal Grandmother   . Colon cancer Neg Hx   . CAD Neg Hx     Social History   Socioeconomic History  . Marital status: Divorced    Spouse name: Not on file  . Number of children: Not on file  .  Years of education: Not on file  . Highest education level: Not on file  Occupational History  . Occupation: disability   Tobacco Use  . Smoking status: Current Every Day Smoker    Packs/day: 1.50    Types: Cigarettes  . Smokeless tobacco: Never Used  Substance and Sexual Activity  . Alcohol use: Yes    Comment:  socially , rare   . Drug use: No  . Sexual activity: Not Currently  Other Topics Concern  . Not on file  Social History Narrative   [name]   Lives w/ parents    Father Hardie PulleyMiguel Miranda    Social Determinants of Health   Financial Resource Strain:   . Difficulty of Paying Living Expenses: Not on file  Food Insecurity:   .  Worried About Programme researcher, broadcasting/film/videounning Out of Food in the Last Year: Not on file  . Ran Out of Food in the Last Year: Not on file  Transportation Needs:   . Lack of Transportation (Medical): Not on file  . Lack of Transportation (Non-Medical): Not on file  Physical Activity:   . Days of Exercise per Week: Not on file  . Minutes of Exercise per Session: Not on file  Stress:   . Feeling of Stress : Not on file  Social Connections:   . Frequency of Communication with Friends and Family: Not on file  . Frequency of Social Gatherings with Friends and Family: Not on file  . Attends Religious Services: Not on file  . Active Member of Clubs or Organizations: Not on file  . Attends BankerClub or Organization Meetings: Not on file  . Marital Status: Not on file  Intimate Partner Violence:   . Fear of Current or Ex-Partner: Not on file  . Emotionally Abused: Not on file  . Physically Abused: Not on file  . Sexually Abused: Not on file    Past Medical History, Surgical history, Social history, and Family history were reviewed and updated as appropriate.   Please see review of systems for further details on the patient's review from today.   Objective:   Physical Exam:  There were no vitals taken for this visit.  Physical Exam Constitutional:      General: She is not in acute distress.    Appearance: She is well-developed.  Musculoskeletal:        General: No deformity.  Neurological:     Mental Status: She is alert and oriented to person, place, and time.     Coordination: Coordination normal.  Psychiatric:        Attention and Perception: Attention and perception normal. She does not perceive auditory or visual hallucinations.        Mood and Affect: Mood normal. Mood is not anxious or depressed. Affect is not labile, blunt, angry or inappropriate.        Speech: Speech normal.        Behavior: Behavior normal.        Thought Content: Thought content normal. Thought content is not paranoid or  delusional. Thought content does not include homicidal or suicidal ideation. Thought content does not include homicidal or suicidal plan.        Cognition and Memory: Cognition and memory normal.        Judgment: Judgment normal.     Comments: Insight intact     Lab Review:     Component Value Date/Time   NA 136 07/15/2018 0743   K 3.4 (L) 07/15/2018 0743   CL  103 07/15/2018 0743   CO2 25 07/15/2018 0743   GLUCOSE 89 07/15/2018 0743   BUN 11 07/15/2018 0743   CREATININE 0.72 07/15/2018 0743   CALCIUM 8.9 07/15/2018 0743   PROT 7.0 07/15/2018 0743   ALBUMIN 3.8 07/15/2018 0743   AST 29 07/15/2018 0743   ALT 40 07/15/2018 0743   ALKPHOS 91 07/15/2018 0743   BILITOT 0.8 07/15/2018 0743   GFRNONAA >60 07/15/2018 0743   GFRAA >60 07/15/2018 0743       Component Value Date/Time   WBC 7.4 07/15/2018 0743   RBC 4.57 07/15/2018 0743   HGB 14.1 07/15/2018 0743   HCT 42.6 07/15/2018 0743   PLT 322 07/15/2018 0743   MCV 93.2 07/15/2018 0743   MCH 30.9 07/15/2018 0743   MCHC 33.1 07/15/2018 0743   RDW 14.0 07/15/2018 0743   LYMPHSABS 2.0 07/15/2018 0743   MONOABS 0.6 07/15/2018 0743   EOSABS 0.3 07/15/2018 0743   BASOSABS 0.0 07/15/2018 0743    No results found for: POCLITH, LITHIUM   No results found for: PHENYTOIN, PHENOBARB, VALPROATE, CBMZ   .res Assessment: Plan:    Plan:  Benztropine 0.5mg  BID Clonidine 0.1mg  BID Vraylar 6mg  daily   Prolactin level - 2nd request - lab slip given.  RTC 4 weeks  Patient advised to contact office with any questions, adverse effects, or acute worsening in signs and symptoms.  Discussed potential metabolic side effects associated with atypical antipsychotics, as well as potential risk for movement side effects. Advised pt to contact office if movement side effects occur. Krishna was seen today for anxiety, insomnia, schizophrenia and other.  Diagnoses and all orders for this visit:  High risk medication use -      Prolactin     Please see After Visit Summary for patient specific instructions.  Future Appointments  Date Time Provider Department Center  01/23/2019  9:00 AM [facility]  05/13/2019 10:00 AM 07/13/2019, MD [id number]    Orders Placed This Encounter  Procedures  . Prolactin    -------------------------------

## 2019-01-11 MED ORDER — BETAMETHASONE DIPROPIONATE AUG 0.05 % EX CREA: TOPICAL_CREAM | Freq: Two times a day (BID) | CUTANEOUS | 1 refills | Status: DC

## 2019-01-11 NOTE — Assessment & Plan Note (Signed)
HTN: Since the last visit, amlodipine was increased to 10 mg, no ambulatory BPs, BP today is 133/96.  No apparent side effects. Plan: Continue amlodipine, increase clonidine to 0.2 mg twice a day, goal is to get DBP better.. Recommend to monitor BPs.  Follow-up 6 months Eczema: (Hands) a chronic issue, previously, steroids helped, Diprolene sent. Preventive care discussed RTC 4 months, CPX

## 2019-01-11 NOTE — Assessment & Plan Note (Signed)
Preventive care: Gynecology referral failed, recommend to contact gynecology directly, see AVS Mammogram failed, recommend to schedule it today . Colonoscopy discussed, declined

## 2019-01-23 ENCOUNTER — Other Ambulatory Visit: Payer: Self-pay

## 2019-01-23 ENCOUNTER — Ambulatory Visit (HOSPITAL_BASED_OUTPATIENT_CLINIC_OR_DEPARTMENT_OTHER)
Admission: RE | Admit: 2019-01-23 | Discharge: 2019-01-23 | Disposition: A | Discharge status: Home / Self Care | Payer: Medicare HMO | Source: Ambulatory Visit | Attending: Internal Medicine | Admitting: Internal Medicine

## 2019-01-23 DIAGNOSIS — Z1231 Encounter for screening mammogram for malignant neoplasm of breast: Secondary | ICD-10-CM | POA: Insufficient documentation

## 2019-01-29 ENCOUNTER — Emergency Department (HOSPITAL_COMMUNITY)
Admission: EM | Admit: 2019-01-29 | Discharge: 2019-01-29 | Disposition: A | Discharge status: Home / Self Care | Payer: Medicare HMO | Attending: Emergency Medicine | Admitting: Emergency Medicine

## 2019-01-29 ENCOUNTER — Encounter (HOSPITAL_COMMUNITY): Payer: Self-pay | Admitting: Family Medicine

## 2019-01-29 DIAGNOSIS — Z3202 Encounter for pregnancy test, result negative: Secondary | ICD-10-CM | POA: Diagnosis not present

## 2019-01-29 DIAGNOSIS — R5381 Other malaise: Secondary | ICD-10-CM | POA: Diagnosis not present

## 2019-01-29 DIAGNOSIS — F17210 Nicotine dependence, cigarettes, uncomplicated: Secondary | ICD-10-CM | POA: Diagnosis not present

## 2019-01-29 DIAGNOSIS — I10 Essential (primary) hypertension: Secondary | ICD-10-CM | POA: Diagnosis not present

## 2019-01-29 LAB — PREGNANCY, URINE: Preg Test, Ur: NEGATIVE

## 2019-01-29 NOTE — ED Provider Notes (Signed)
[facility]ENC[facility] DEPT [facility] Emergency Department Provider Note MRN:  [id number]  Arrival date & time: 01/29/19     Chief Complaint   wants pregnancy test   History of Present Illness   Chelsea [name] is a 49 y.o. year-old female with a history of bipolar disorder presenting to the ED with chief complaint of wants pregnancy test.  Patient simply wants to know if she is pregnant.  She states she is currently having vaginal bleeding consistent with her monthly period.  She denies fever, no cough, no chest pain, shortness of breath, no abdominal pain, no physical or sexual abuse.  States that her mood is good, no SI, no HI, no AVH.  Denies any sexual activity recently.  Review of Systems  A complete 10 system review of systems was obtained and all systems are negative except as noted in the HPI and PMH.   Patient's Health History    Past Medical History:  Diagnosis Date  . Allergy   . Bipolar disorder ([facility])   . Depression   . Eczema   . Elevated cholesterol   . Hypertension   . Schizophrenia [facility])     Past Surgical History:  Procedure Laterality Date  . NO PAST SURGERIES      Family History  Problem Relation Age of Onset  . Breast cancer Mother   . Hypertension Father   . Diabetes Paternal Grandmother   . Colon cancer Neg Hx   . CAD Neg Hx     Social History   Socioeconomic History  . Marital status: Divorced    Spouse name: Not on file  . Number of children: Not on file  . Years of education: Not on file  . Highest education level: Not on file  Occupational History  . Occupation: disability   Tobacco Use  . Smoking status: Current Every Day Smoker    Packs/day: 1.50    Types: Cigarettes  . Smokeless tobacco: Never Used  Substance and Sexual Activity  . Alcohol use: Yes    Comment:  socially , rare   . Drug use: No  . Sexual activity: Not Currently  Other Topics Concern  . Not on file  Social History Narrative   [name]   Lives w/ parents    Father  Vilma Will    Social Determinants of Health   Financial Resource Strain:   . Difficulty of Paying Living Expenses: Not on file  Food Insecurity:   . Worried About Programme researcher, broadcasting/film/video in the Last Year: Not on file  . Ran Out of Food in the Last Year: Not on file  Transportation Needs:   . Lack of Transportation (Medical): Not on file  . Lack of Transportation (Non-Medical): Not on file  Physical Activity:   . Days of Exercise per Week: Not on file  . Minutes of Exercise per Session: Not on file  Stress:   . Feeling of Stress : Not on file  Social Connections:   . Frequency of Communication with Friends and Family: Not on file  . Frequency of Social Gatherings with Friends and Family: Not on file  . Attends Religious Services: Not on file  . Active Member of Clubs or Organizations: Not on file  . Attends Banker Meetings: Not on file  . Marital Status: Not on file  Intimate Partner Violence:   . Fear of Current or Ex-Partner: Not on file  . Emotionally Abused: Not on file  . Physically Abused: Not on  file  . Sexually Abused: Not on file     Physical Exam   Vitals:   01/29/19 1641  BP: (!) 193/117  Pulse: 99  Resp: 16  Temp: 98.6 F (37 C)  SpO2: 97%    CONSTITUTIONAL: Well-appearing, NAD NEURO:  Alert and oriented x 3, no focal deficits EYES:  eyes equal and reactive ENT/NECK:  no LAD, no JVD CARDIO: Regular rate, well-perfused, normal S1 and S2 PULM:  CTAB no wheezing or rhonchi GI/GU:  normal bowel sounds, non-distended, non-tender MSK/SPINE:  No gross deformities, no edema SKIN:  no rash, atraumatic PSYCH: Slightly withdrawn speech and behavior  *Additional and/or pertinent findings included in MDM below  Diagnostic and Interventional Summary    EKG Interpretation  Date/Time:    Ventricular Rate:    PR Interval:    QRS Duration:   QT Interval:    QTC Calculation:   R Axis:     Text Interpretation:        Labs Reviewed  PREGNANCY,  URINE    No orders to display    Medications - No data to display   Procedures  /  Critical Care Procedures  ED Course and Medical Decision Making  I have reviewed the triage vital signs, the nursing notes, and pertinent available records from the EMR.  Pertinent labs & imaging results that were available during my care of the patient were reviewed by me and considered in my medical decision making (see below for details).     Normal vital signs, patient seems to have poor understanding of pregnancy given that she is not sexually active and is currently on her period but still wants to make sure she is not pregnant.  Denies sexual assault or rape, denies any trauma or bruising, denies anybody in her life that would rape her.  I feel this visit is largely driven by psychiatric illness, but I do not feel she is a harm to herself or others.  Will test for pregnancy and discharge.  5:15 PM update: Pregnancy test negative, appropriate for discharge.  Barth Kirks. Sedonia Small, MD [place] [email or web address]  Final Clinical Impressions(s) / ED Diagnoses     ICD-10-CM   1. Encounter for pregnancy test, result negative  Z32.02     ED Discharge Orders    None       Discharge Instructions Discussed with and Provided to Patient:     Discharge Instructions     You were evaluated in the Emergency Department and after careful evaluation, we did not find any emergent condition requiring admission or further testing in the hospital.  Your exam/testing today was overall reassuring.  Your pregnancy test was negative.  Please return to the Emergency Department if you experience any worsening of your condition.  We encourage you to follow up with a primary care provider.  Thank you for allowing [place] to be a part of your care.        Maudie Flakes, MD 01/29/19 [phone]

## 2019-01-29 NOTE — ED Triage Notes (Signed)
Pt comes in via EMS from home for weight gain and wants a pregnancy test. Reports that she hasn't been sexually active in 3-4 years, currently on her menstrual cycle but wanting a pregnancy test.  Pt has Bipolar 156/108, 100HR, 96% on RA.

## 2019-01-29 NOTE — Discharge Instructions (Addendum)
You were evaluated in the Emergency Department and after careful evaluation, we did not find any emergent condition requiring admission or further testing in the hospital.  Your exam/testing today was overall reassuring.  Your pregnancy test was negative.  Please return to the Emergency Department if you experience any worsening of your condition.  We encourage you to follow up with a primary care provider.  Thank you for allowing [place] to be a part of your care.

## 2019-02-05 ENCOUNTER — Telehealth: Payer: Self-pay | Admitting: Adult Health

## 2019-02-05 NOTE — Telephone Encounter (Signed)
Pt called requesting samples of Vraylar. Appt scheduled for 2/11.

## 2019-02-05 NOTE — Telephone Encounter (Signed)
Noted,  Thank you!

## 2019-02-05 NOTE — Telephone Encounter (Signed)
I got her enough samples to get her to her next appt. I will call and let her know.

## 2019-02-18 ENCOUNTER — Telehealth: Payer: Self-pay | Admitting: Adult Health

## 2019-02-18 NOTE — Telephone Encounter (Signed)
Patient called and said that she wants to talk to you . She is going to a different provider who is hispanic. Please call her at [phone]. She wants to say goodbye

## 2019-02-18 NOTE — Telephone Encounter (Signed)
Noted  

## 2019-02-20 ENCOUNTER — Ambulatory Visit: Payer: Medicare HMO | Admitting: Adult Health

## 2019-04-23 ENCOUNTER — Inpatient Hospital Stay (HOSPITAL_COMMUNITY)
Admission: AD | Admit: 2019-04-23 | Discharge: 2019-04-29 | DRG: 885 | Disposition: A | Discharge status: Home / Self Care | Payer: Federal, State, Local not specified - Other | Attending: Psychiatry | Admitting: Psychiatry

## 2019-04-23 ENCOUNTER — Other Ambulatory Visit: Payer: Self-pay | Admitting: Behavioral Health

## 2019-04-23 ENCOUNTER — Other Ambulatory Visit: Payer: Self-pay

## 2019-04-23 ENCOUNTER — Encounter (HOSPITAL_COMMUNITY): Payer: Self-pay | Admitting: Psychiatry

## 2019-04-23 DIAGNOSIS — F5105 Insomnia due to other mental disorder: Secondary | ICD-10-CM | POA: Diagnosis present

## 2019-04-23 DIAGNOSIS — E7800 Pure hypercholesterolemia, unspecified: Secondary | ICD-10-CM | POA: Diagnosis present

## 2019-04-23 DIAGNOSIS — Z833 Family history of diabetes mellitus: Secondary | ICD-10-CM

## 2019-04-23 DIAGNOSIS — I10 Essential (primary) hypertension: Secondary | ICD-10-CM | POA: Diagnosis present

## 2019-04-23 DIAGNOSIS — Z8249 Family history of ischemic heart disease and other diseases of the circulatory system: Secondary | ICD-10-CM

## 2019-04-23 DIAGNOSIS — F250 Schizoaffective disorder, bipolar type: Principal | ICD-10-CM | POA: Diagnosis present

## 2019-04-23 DIAGNOSIS — Z888 Allergy status to other drugs, medicaments and biological substances status: Secondary | ICD-10-CM

## 2019-04-23 DIAGNOSIS — Z20822 Contact with and (suspected) exposure to covid-19: Secondary | ICD-10-CM | POA: Diagnosis present

## 2019-04-23 DIAGNOSIS — F259 Schizoaffective disorder, unspecified: Secondary | ICD-10-CM | POA: Diagnosis present

## 2019-04-23 DIAGNOSIS — Z886 Allergy status to analgesic agent status: Secondary | ICD-10-CM

## 2019-04-23 DIAGNOSIS — Z9114 Patient's other noncompliance with medication regimen: Secondary | ICD-10-CM

## 2019-04-23 DIAGNOSIS — F319 Bipolar disorder, unspecified: Secondary | ICD-10-CM | POA: Diagnosis present

## 2019-04-23 DIAGNOSIS — F312 Bipolar disorder, current episode manic severe with psychotic features: Secondary | ICD-10-CM

## 2019-04-23 DIAGNOSIS — F17210 Nicotine dependence, cigarettes, uncomplicated: Secondary | ICD-10-CM | POA: Diagnosis present

## 2019-04-23 LAB — RESPIRATORY PANEL BY RT PCR (FLU A&B, COVID)
Influenza A by PCR: NEGATIVE
Influenza B by PCR: NEGATIVE
SARS Coronavirus 2 by RT PCR: NEGATIVE

## 2019-04-23 MED ORDER — AMLODIPINE BESYLATE 10 MG PO TABS
10.0000 mg | ORAL_TABLET | Freq: Every day | ORAL | Status: DC
  Administered 2019-04-24 – 2019-04-29 (×6): 10 mg via ORAL
  Filled 2019-04-23 (×2): qty 1
  Filled 2019-04-23: qty 2
  Filled 2019-04-23 (×4): qty 1

## 2019-04-23 MED ORDER — CLONIDINE HCL 0.2 MG PO TABS
0.2000 mg | ORAL_TABLET | Freq: Two times a day (BID) | ORAL | Status: DC
  Administered 2019-04-24: 0.2 mg via ORAL
  Filled 2019-04-23: qty 1
  Filled 2019-04-23: qty 2
  Filled 2019-04-23: qty 1
  Filled 2019-04-23: qty 2

## 2019-04-23 MED ORDER — LURASIDONE HCL 40 MG PO TABS
40.0000 mg | ORAL_TABLET | Freq: Every day | ORAL | Status: DC
  Administered 2019-04-23: 40 mg via ORAL
  Filled 2019-04-23 (×3): qty 1
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
## 2019-04-23 NOTE — BH Assessment (Signed)
Assessment Note  Chelsea [name] is an 49 y.o. female who was brought to [facility] by the police on IVC, petitioned by her family:  Per IVC:  Patient has been diagnosed with Schizoaffective Disorder Bipolar Type and is no-compliant with taking her medication. Patient is not sleeping or attending to her personal hygiene.  She has a history of multiple mental health commitments.  Patient is talking to walls, screaming and having verbal altercations with people who are not there. She has been aggressive with family members.  She has been distracted and leaving the oven/stove on.  She takes the shared family car for hours at a time and does not let anyone know where she is.  She has gotten lost while driving and returning hours later.  She has been leaving the house at 3 am.  Patient denies everything on the commitment papers and states that her family is lying.  She states that her medications were recently changed.  She states that she was last in the hospital at [facility] Last year and states that she is currently being [facility] on [facility]. Patient states that she has been sleeping and eating well.  She denies SI/HI/Psychosis and states that she has no SA issues.  She denies any history of abuse or self-mutilation.  TTS spoke to petitioner, Randall An [phone], who reports that patient was fine last week, but he states that she has not been taking her medication and her behavior is getting out of control.  He states that she has been aggressive towards family, has been making statements about killing herself or others.  He states that she has been carrying on conversations with herself and he states that she argues and fights with people who are not there.  He said, "her behavior is getting so bad that it cannot be ignored.  He states that she is not bathing or eating and he states that she has been chain smoking.  He states that she has spent all of her money on intranet scams  because she cannot identify what is real and what is not.  He states that when she takes her medication that she is a normal person.  Diagnosis: F25 Schizoaffective Disorder  Past Medical History:  Past Medical History:  Diagnosis Date  . Allergy   . Bipolar disorder (HCC)   . Depression   . Eczema   . Elevated cholesterol   . Hypertension   . Schizophrenia [facility])     Past Surgical History:  Procedure Laterality Date  . NO PAST SURGERIES      Family History:  Family History  Problem Relation Age of Onset  . Breast cancer Mother   . Hypertension Father   . Diabetes Paternal Grandmother   . Colon cancer Neg Hx   . CAD Neg Hx     Social History:  reports that she has been smoking cigarettes. She has been smoking about 1.50 packs per day. She has never used smokeless tobacco. She reports current alcohol use. She reports that she does not use drugs.  Additional Social History:  Alcohol / Drug Use Pain Medications: See MAR Prescriptions: See MAR Over the Counter: See MAR History of alcohol / drug use?: No history of alcohol / drug abuse Longest period of sobriety (when/how long): NA  CIWA: CIWA-Ar BP: (!) 174/105([place] Designer, television/film set was notified) Pulse Rate: (!) 111([employer] Designer, television/film set was notified) COWS:    Allergies:  Allergies  Allergen Reactions  .  Divalproex Sodium Er Other (See Comments)    Delirium with hyperammonemia  . Aspirin     REACTION: swelling  . Fish Allergy     Home Medications: (Not in a hospital admission)   OB/GYN Status:  No LMP recorded.  General Assessment Data Location of Assessment: [facility] Assessment Services TTS Assessment: In system Is this a Tele or Face-to-Face Assessment?: Face-to-Face Is this an Initial Assessment or a Re-assessment for this encounter?: Initial Assessment Patient Accompanied by:: Other(police) Language Other than English: No Living Arrangements: Other (Comment)(with family) What gender do you identify as?:  Female Marital status: Single Living Arrangements: Parent, Other relatives Can pt return to current living arrangement?: Yes Admission Status: Involuntary Petitioner: Family member Is patient capable of signing voluntary admission?: No Referral Source: Self/Family/Friend Insurance type: Not on File  Medical Screening Exam ([place]) Medical Exam completed: Yes  Crisis Care Plan Living Arrangements: Parent, Other relatives Legal Guardian: Other:(self) Name of Psychiatrist: [facility] Name of Therapist: [facility]  Education Status Is patient currently in school?: No Is the patient employed, unemployed or receiving disability?: Receiving disability income  Risk to self with the past 6 months Suicidal Ideation: No Has patient been a risk to self within the past 6 months prior to admission? : No Suicidal Intent: No-Not Currently/Within Last 6 Months Has patient had any suicidal intent within the past 6 months prior to admission? : No Is patient at risk for suicide?: No Suicidal Plan?: No Has patient had any suicidal plan within the past 6 months prior to admission? : No Access to Means: No What has been your use of drugs/alcohol within the last 12 months?: none reported Previous Attempts/Gestures: No How many times?: 0 Other Self Harm Risks: psychosis Triggers for Past Attempts: None known Intentional Self Injurious Behavior: None Family Suicide History: No Recent stressful life event(s): Conflict (Comment)(family conflict) Persecutory voices/beliefs?: No Depression: Yes Depression Symptoms: Despondent, Isolating, Feeling angry/irritable Substance abuse history and/or treatment for substance abuse?: No Suicide prevention information given to non-admitted patients: Not applicable  Risk to Others within the past 6 months Homicidal Ideation: No Does patient have any lifetime risk of violence toward others beyond the six months prior to admission? :  No Thoughts of Harm to Others: No Current Homicidal Intent: No Current Homicidal Plan: No-Not Currently/Within Last 6 Months Access to Homicidal Means: No Identified Victim: none History of harm to others?: No Assessment of Violence: None Noted Violent Behavior Description: none Does patient have access to weapons?: No Criminal Charges Pending?: No Does patient have a court date: No Is patient on probation?: No  Psychosis Hallucinations: None noted Delusions: None noted  Mental Status Report Appearance/Hygiene: Unremarkable Eye Contact: Good Motor Activity: Restlessness Speech: Pressured Level of Consciousness: Alert Mood: Depressed, Anxious Affect: Flat Anxiety Level: Moderate Thought Processes: Coherent, Relevant Judgement: Partial Orientation: Person, Place, Time, Situation Obsessive Compulsive Thoughts/Behaviors: None  Cognitive Functioning Concentration: Normal Memory: Recent Intact, Remote Intact Is patient IDD: No Insight: Fair Impulse Control: Fair Appetite: Good Have you had any weight changes? : No Change Sleep: No Change Total Hours of Sleep: 6 Vegetative Symptoms: Not bathing, Decreased grooming  ADLScreening [facility] Assessment Services) Patient's cognitive ability adequate to safely complete daily activities?: Yes Patient able to express need for assistance with ADLs?: Yes Independently performs ADLs?: Yes (appropriate for developmental age)  Prior Inpatient Therapy Prior Inpatient Therapy: Yes Prior Therapy Dates: Last was one year ago Prior Therapy Facilty/Provider(s): [facility] Reason for Treatment: Schizoaffective Disorder  Prior Outpatient Therapy Prior Outpatient Therapy: Yes Prior Therapy Dates: active Prior Therapy Facilty/Provider(s): [facility] Reason for Treatment: schizoaffective Does patient have an ACCT team?: No Does patient have Intensive In-House Services?  : No Does patient have [facility] services? : No Does patient have P4CC  services?: No  ADL Screening (condition at time of admission) Patient's cognitive ability adequate to safely complete daily activities?: Yes Is the patient deaf or have difficulty hearing?: No Does the patient have difficulty seeing, even when wearing glasses/contacts?: No Does the patient have difficulty concentrating, remembering, or making decisions?: No Patient able to express need for assistance with ADLs?: Yes Does the patient have difficulty dressing or bathing?: No Independently performs ADLs?: Yes (appropriate for developmental age) Does the patient have difficulty walking or climbing stairs?: No Weakness of Legs: None Weakness of Arms/Hands: None  Home Assistive Devices/Equipment Home Assistive Devices/Equipment: None  Therapy Consults (therapy consults require a physician order) PT Evaluation Needed: No OT Evalulation Needed: No SLP Evaluation Needed: No Abuse/Neglect Assessment (Assessment to be complete while patient is alone) Abuse/Neglect Assessment Can Be Completed: Yes Physical Abuse: Denies Verbal Abuse: Denies Sexual Abuse: Denies Exploitation of patient/patient's resources: Denies Self-Neglect: Denies Values / Beliefs Cultural Requests During Hospitalization: None Spiritual Requests During Hospitalization: None Consults Spiritual Care Consult Needed: No Transition of Care Team Consult Needed: No Advance Directives (For Healthcare) Does Patient Have a Medical Advance Directive?: No Would patient like information on creating a medical advance directive?: No - Patient declined Nutrition Screen- [facility] Adult/WL/AP Has the patient recently lost weight without trying?: No Has the patient been eating poorly because of a decreased appetite?: No Malnutrition Screening Tool Score: 0        Disposition: Per Denzil Magnuson, NP, overnight observation for safety and stability is recommended and patient will be re-started on her medication. Disposition Initial  Assessment Completed for this Encounter: Yes Disposition of Patient: (Overnight OBS)  On Site Evaluation by:   Reviewed with Physician:    Arnoldo Lenis Mercy Leppla 04/23/2019 4:38 PM
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
## 2019-04-23 NOTE — Progress Notes (Signed)
49 yo female admitted for overnight observation due to being non-compliant with medication. Pt denies intent to harm self or others. Patient denies hallucinations. Easily agitated and blunt with remarks. No physical aggression noted. Took medications as prescribed by NP. Patient laying on bed resting. No complaints voiced or noted. Will monitor for safety via 15 minute checks per hospital standards.

## 2019-04-23 NOTE — Plan of Care (Signed)
[facility] Observation Crisis Plan  Reason for Crisis Plan:  Crisis Stabilization   Plan of Care:  Referral for Telepsychiatry/Psychiatric Consult  Family Support:      Current Living Environment:  Living Arrangements: Parent, Other relatives  Insurance:   Hospital Account    Name Acct ID Class Status Primary Coverage   Chelsea [name], Chelsea [name] [id number] BEHAVIORAL HEALTH OBSERVATION Open None        Guarantor Account (for Hospital Account [id number])    Name Relation to Pt Service Area Active? Acct Type   Chelsea [name] Self [facility] Yes Behavioral Health   Address Phone       [address] [place], [place] [address] [phone](H)          Coverage Information (for Hospital Account [id number])    Not on file      Legal Guardian:  Legal Guardian: Other:(self)  Primary Care Provider:  Wanda Plump, MD  Current Outpatient Providers:  unknown  Psychiatrist:  Name of Psychiatrist: [name]  Counselor/Therapist:  Name of Therapist: [name]  Compliant with Medications:  Yes  Additional Information:   Chelsea [name] 4/14/20215:49 PM

## 2019-04-24 DIAGNOSIS — F250 Schizoaffective disorder, bipolar type: Secondary | ICD-10-CM | POA: Diagnosis present

## 2019-04-24 DIAGNOSIS — F312 Bipolar disorder, current episode manic severe with psychotic features: Secondary | ICD-10-CM | POA: Diagnosis not present

## 2019-04-24 DIAGNOSIS — F259 Schizoaffective disorder, unspecified: Secondary | ICD-10-CM | POA: Diagnosis present

## 2019-04-24 DIAGNOSIS — Z833 Family history of diabetes mellitus: Secondary | ICD-10-CM | POA: Diagnosis not present

## 2019-04-24 DIAGNOSIS — Z888 Allergy status to other drugs, medicaments and biological substances status: Secondary | ICD-10-CM | POA: Diagnosis not present

## 2019-04-24 DIAGNOSIS — Z9114 Patient's other noncompliance with medication regimen: Secondary | ICD-10-CM | POA: Diagnosis not present

## 2019-04-24 DIAGNOSIS — Z886 Allergy status to analgesic agent status: Secondary | ICD-10-CM | POA: Diagnosis not present

## 2019-04-24 DIAGNOSIS — F5105 Insomnia due to other mental disorder: Secondary | ICD-10-CM | POA: Diagnosis present

## 2019-04-24 DIAGNOSIS — F319 Bipolar disorder, unspecified: Secondary | ICD-10-CM | POA: Diagnosis present

## 2019-04-24 DIAGNOSIS — F17210 Nicotine dependence, cigarettes, uncomplicated: Secondary | ICD-10-CM | POA: Diagnosis present

## 2019-04-24 DIAGNOSIS — Z8249 Family history of ischemic heart disease and other diseases of the circulatory system: Secondary | ICD-10-CM | POA: Diagnosis not present

## 2019-04-24 DIAGNOSIS — I10 Essential (primary) hypertension: Secondary | ICD-10-CM | POA: Diagnosis present

## 2019-04-24 DIAGNOSIS — Z20822 Contact with and (suspected) exposure to covid-19: Secondary | ICD-10-CM | POA: Diagnosis present

## 2019-04-24 DIAGNOSIS — E7800 Pure hypercholesterolemia, unspecified: Secondary | ICD-10-CM | POA: Diagnosis present

## 2019-04-24 MED ORDER — LORAZEPAM 2 MG/ML IJ SOLN: 2.0000 mg | Freq: Four times a day (QID) | INTRAMUSCULAR | Status: DC | PRN

## 2019-04-24 MED ORDER — TRAZODONE HCL 50 MG PO TABS
50.0000 mg | ORAL_TABLET | Freq: Every evening | ORAL | Status: DC | PRN
  Administered 2019-04-25 – 2019-04-27 (×3): 50 mg via ORAL
  Filled 2019-04-24 (×3): qty 1

## 2019-04-24 MED ORDER — CLONIDINE HCL 0.2 MG PO TABS
0.2000 mg | ORAL_TABLET | Freq: Two times a day (BID) | ORAL | Status: DC
  Administered 2019-04-24 – 2019-04-29 (×10): 0.2 mg via ORAL
  Filled 2019-04-24 (×11): qty 1
  Filled 2019-04-24: qty 2
  Filled 2019-04-24: qty 1

## 2019-04-24 MED ORDER — BENZTROPINE MESYLATE 0.5 MG PO TABS
ORAL_TABLET | ORAL | Status: AC
  Filled 2019-04-24: qty 1

## 2019-04-24 MED ORDER — RISPERIDONE 3 MG PO TABS
3.0000 mg | ORAL_TABLET | Freq: Two times a day (BID) | ORAL | Status: DC
  Administered 2019-04-24 – 2019-04-26 (×3): 3 mg via ORAL
  Filled 2019-04-24 (×7): qty 1

## 2019-04-24 MED ORDER — ALUM & MAG HYDROXIDE-SIMETH 200-200-20 MG/5ML PO SUSP: 30.0000 mL | ORAL | Status: DC | PRN

## 2019-04-24 MED ORDER — RISPERIDONE 3 MG PO TABS
ORAL_TABLET | ORAL | Status: AC
  Filled 2019-04-24: qty 1

## 2019-04-24 MED ORDER — BENZTROPINE MESYLATE 0.5 MG PO TABS
0.5000 mg | ORAL_TABLET | Freq: Two times a day (BID) | ORAL | Status: DC
  Administered 2019-04-24 – 2019-04-29 (×10): 0.5 mg via ORAL
  Filled 2019-04-24 (×13): qty 1

## 2019-04-24 MED ORDER — MAGNESIUM HYDROXIDE 400 MG/5ML PO SUSP: 30.0000 mL | Freq: Every day | ORAL | Status: DC | PRN

## 2019-04-24 MED ORDER — ACETAMINOPHEN 325 MG PO TABS
ORAL_TABLET | ORAL | Status: AC
  Filled 2019-04-24: qty 2

## 2019-04-24 MED ORDER — ATORVASTATIN CALCIUM 20 MG PO TABS
20.0000 mg | ORAL_TABLET | Freq: Every day | ORAL | Status: DC
  Administered 2019-04-25 – 2019-04-28 (×4): 20 mg via ORAL
  Filled 2019-04-24 (×2): qty 1
  Filled 2019-04-24: qty 2
  Filled 2019-04-24 (×4): qty 1

## 2019-04-24 MED ORDER — HYDROXYZINE HCL 25 MG PO TABS
25.0000 mg | ORAL_TABLET | Freq: Three times a day (TID) | ORAL | Status: DC | PRN
  Administered 2019-04-25 – 2019-04-29 (×4): 25 mg via ORAL
  Filled 2019-04-24 (×4): qty 1

## 2019-04-24 MED ORDER — ACETAMINOPHEN 325 MG PO TABS
650.0000 mg | ORAL_TABLET | Freq: Four times a day (QID) | ORAL | Status: DC | PRN
  Administered 2019-04-24 – 2019-04-25 (×3): 650 mg via ORAL
  Filled 2019-04-24 (×2): qty 2

## 2019-04-24 MED ORDER — LORAZEPAM 1 MG PO TABS
2.0000 mg | ORAL_TABLET | Freq: Four times a day (QID) | ORAL | Status: DC | PRN
  Administered 2019-04-29: 2 mg via ORAL
  Filled 2019-04-24 (×2): qty 2

## 2019-04-24 MED ORDER — TEMAZEPAM 30 MG PO CAPS
30.0000 mg | ORAL_CAPSULE | Freq: Every day | ORAL | Status: DC
  Administered 2019-04-25 – 2019-04-27 (×3): 30 mg via ORAL
  Filled 2019-04-24 (×4): qty 1

## 2019-04-24 MED ORDER — AMLODIPINE BESYLATE 10 MG PO TABS: 10.0000 mg | ORAL_TABLET | Freq: Every day | ORAL | Status: DC

## 2019-04-24 NOTE — Progress Notes (Signed)
D:  Patient presents hostile toward staff upon entering room. Refuses to take am medication, refuses to allow staff to obtain vitals due to elevated BP reading obtained earlier. Patient spoke with MD and staff. Was encouraged to allow staff to obtain vitals and re-iterated consequences to patients health for being non-compliant with medications and treatment. Pt refuses. Defiant behaviors noted. Patient remains in room, isolative, demanding staff to "stay out", and asleep. Patient states, "All I need is rest to lower my blood pressure. You don't need to keep waking me up to check my pressure. I feel fine. Get out". Patient denies any physical complaints when asked. Patient denies any SI, HI, AVH when asked.    A: Support and encouragement provided. Routine safety checks conducted every 15 minutes per unit protocol. Encouraged to notify staff if thoughts of harm toward self or others arise. Patient agrees.   R: Patient remains safe at this time. Will continue to monitor.        COVID-19 Daily Checkoff  Have you had a fever (temp > 37.80C/100F)  in the past 24 hours?  No  If you have had runny nose, nasal congestion, sneezing in the past 24 hours, has it worsened? No  COVID-19 EXPOSURE  Have you traveled outside the state in the past 14 days? No  Have you been in contact with someone with a confirmed diagnosis of COVID-19 or PUI in the past 14 days without wearing appropriate PPE? No  Have you been living in the same home as a person with confirmed diagnosis of COVID-19 or a PUI (household contact)? No  Have you been diagnosed with COVID-19? No

## 2019-04-24 NOTE — Progress Notes (Signed)
Pt has been isolative in the room. Pt has been running high blood pressure, NP on duty notified and ordered 0.2 mg clonidine PO which was offered to the patient, pt refused to take it even after explaining that it will lower her BP. The Clinical research associate another and Sports coach encouraged the pt, but pt refuse stating that she should be left alone. Pt was left alone, but later the writer tried again to offer the medication to the pt, but still pt refused to take it. Pt kept on q 15 min observation, remains safe on the unit, will continue to monitor.

## 2019-04-24 NOTE — Progress Notes (Signed)
D: Patient in room on observation unit. Denies SI/HI/AVH. Patient remains in room, irritable & labile. A: Refusing scheduled psych meds. Needs met as able. Support and encouragement provided.  R:  Report called to receiving RN Danae Chen. Transferring to 500 unit.

## 2019-04-24 NOTE — BH Assessment (Addendum)
[facility] Assessment Progress Note  Per Malvin Johns, MD, this pt requires psychiatric hospitalization.  Pt has been assigned pt to [facility] Rm 506-2.  Pt presents under IVC initiated by pt's nephew, and upheld by Dr Jeannine Kitten.  IVC documents may be found on pt's chart.  Pt's nurse, Lincoln Maxin, has been notified.   Doylene Canning, [place] Behavioral Health Coordinator [phone]

## 2019-04-24 NOTE — Discharge Summary (Signed)
Physician Discharge Summary Note  Patient:  Chelsea [name] is an 49 y.o., female MRN:  [id number] DOB:  Feb 11, 1970 Patient phone:  [phone] (home)  Patient address:   [address] [place] [address],  Total Time spent with patient: 30 minutes  Date of Admission:  04/23/2019 Date of Discharge: 04/24/19  Reason for Admission:  Patient has been diagnosed with Schizoaffective Disorder Bipolar Type and is no-compliant with taking her medication. Patient is not sleeping or attending to her personal hygiene.  She has a history of multiple mental health commitments.  Patient is talking to walls, screaming and having verbal altercations with people who are not there. She has been aggressive with family members.  She has been distracted and leaving the oven/stove on.  She takes the shared family car for hours at a time and does not let anyone know where she is.  She has gotten lost while driving and returning hours later.  She has been leaving the house at 3 am.  Principal Problem: <principal problem not specified> Discharge Diagnoses: Active Problems:   Bipolar I disorder, current or most recent episode manic, with psychotic features [facility])   Past Psychiatric History: schizoaffective disorder, previous discharges  Past Medical History:  Past Medical History:  Diagnosis Date  . Allergy   . Bipolar disorder ([place])   . Depression   . Eczema   . Elevated cholesterol   . Hypertension   . Schizophrenia [facility])     Past Surgical History:  Procedure Laterality Date  . NO PAST SURGERIES     Family History:  Family History  Problem Relation Age of Onset  . Breast cancer Mother   . Hypertension Father   . Diabetes Paternal Grandmother   . Colon cancer Neg Hx   . CAD Neg Hx    Family Psychiatric  History: refuse to answer Social History:  Social History   Substance and Sexual Activity  Alcohol Use Yes   Comment:  socially , rare      Social History   Substance and  Sexual Activity  Drug Use No    Social History   Socioeconomic History  . Marital status: Divorced    Spouse name: Not on file  . Number of children: Not on file  . Years of education: Not on file  . Highest education level: Not on file  Occupational History  . Occupation: disability   Tobacco Use  . Smoking status: Current Every Day Smoker    Packs/day: 1.50    Types: Cigarettes  . Smokeless tobacco: Never Used  Substance and Sexual Activity  . Alcohol use: Yes    Comment:  socially , rare   . Drug use: No  . Sexual activity: Not Currently  Other Topics Concern  . Not on file  Social History Narrative   [name]   Lives w/ parents    Father Yukiko Minnich    Social Determinants of Health   Financial Resource Strain:   . Difficulty of Paying Living Expenses:   Food Insecurity:   . Worried About Charity fundraiser in the Last Year:   . Arboriculturist in the Last Year:   Transportation Needs:   . Film/video editor (Medical):   Marland Kitchen Lack of Transportation (Non-Medical):   Physical Activity:   . Days of Exercise per Week:   . Minutes of Exercise per Session:   Stress:   . Feeling of Stress :   Social Connections:   . Frequency  of Communication with Friends and Family:   . Frequency of Social Gatherings with Friends and Family:   . Attends Religious Services:   . Active Member of Clubs or Organizations:   . Attends Banker Meetings:   Marland Kitchen Marital Status:     Hospital Course:  Patient remained on the OBS unit overnight. The patient continues to meet inpatient psychiatric treatment and will be admitted to [facility] inpatient  Physical Findings: AIMS:  , ,  ,  ,    CIWA:  CIWA-Ar Total: 8 COWS:     Musculoskeletal: Strength & Muscle Tone: within normal limits Gait & Station: normal Patient leans: N/A  Psychiatric Specialty Exam: Physical Exam  Nursing note and vitals reviewed. Constitutional: She is oriented to person, place, and time. She appears  well-developed and well-nourished.  Cardiovascular: Normal rate.  Respiratory: Effort normal.  Musculoskeletal:        General: Normal range of motion.  Neurological: She is alert and oriented to person, place, and time.  Skin: Skin is warm.  Psychiatric: Her affect is labile. She is agitated and actively hallucinating. Thought content is delusional. She expresses inappropriate judgment. She exhibits a depressed mood.    Review of Systems  Constitutional: Negative.   HENT: Negative.   Eyes: Negative.   Respiratory: Negative.   Cardiovascular: Negative.   Gastrointestinal: Negative.   Genitourinary: Negative.   Musculoskeletal: Negative.   Skin: Negative.   Neurological: Negative.     Blood pressure (!) 173/111, pulse 89, temperature 98.3 F (36.8 C), temperature source Oral, resp. rate 18, height 5\' 6"  (1.676 m), weight 87.1 kg, SpO2 96 %.Body mass index is 30.99 kg/m.  General Appearance: Disheveled  Eye Contact:  Minimal  Speech:  Clear and Coherent  Volume:  Decreased  Mood:  Irritable  Affect:  Blunt  Thought Process:  Disorganized and Descriptions of Associations: Tangential  Orientation:  Full (Time, Place, and Person)  Thought Content:  Illogical, Delusions, Hallucinations: Auditory, Rumination and Tangential  Suicidal Thoughts:  No  Homicidal Thoughts:  No  Memory:  Immediate;   Poor Recent;   Poor Remote;   Poor  Judgement:  Impaired  Insight:  Lacking  Psychomotor Activity:  Decreased  Concentration:  Concentration: Poor  Recall:  Fair  Fund of Knowledge:  Fair  Language:  Fair  Akathisia:  No  Handed:  Right  AIMS (if indicated):     Assets:  Financial Resources/Insurance Housing Social Support  ADL's:  Impaired  Cognition:  WNL  Sleep:           Has this patient used any form of tobacco in the last 30 days? (Cigarettes, Smokeless Tobacco, Cigars, and/or Pipes) Yes, No  Blood Alcohol level:  Lab Results  Component Value Date   ETH <10  07/15/2018   ETH <10 11/06/2017    Metabolic Disorder Labs:  Lab Results  Component Value Date   HGBA1C 5.6 12/14/2016   No results found for: PROLACTIN Lab Results  Component Value Date   CHOL 203 (H) 07/09/2018   TRIG 147.0 07/09/2018   HDL 46.60 07/09/2018   CHOLHDL 4 07/09/2018   VLDL 29.4 07/09/2018   LDLCALC 127 (H) 07/09/2018   LDLCALC 65 04/15/2009    See Psychiatric Specialty Exam and Suicide Risk Assessment completed by Attending Physician prior to discharge.  Discharge destination:  Other:  inpatient hospital  Is patient on multiple antipsychotic therapies at discharge:  No   Has Patient had three or more failed trials  of antipsychotic monotherapy by history:  No  Recommended Plan for Multiple Antipsychotic Therapies: NA  Discharge Instructions    Diet - low sodium heart healthy   Complete by: As directed    Increase activity slowly   Complete by: As directed      Allergies as of 04/24/2019      Reactions   Divalproex Sodium Er Other (See Comments)   Delirium with hyperammonemia   Aspirin    REACTION: swelling   Fish Allergy       Medication List    STOP taking these medications   augmented betamethasone dipropionate 0.05 % cream Commonly known as: DIPROLENE-AF   lurasidone 40 MG Tabs tablet Commonly known as: LATUDA     TAKE these medications     Indication  amLODipine 10 MG tablet Commonly known as: NORVASC Take 1 tablet (10 mg total) by mouth daily.  Indication: High Blood Pressure Disorder   atorvastatin 20 MG tablet Commonly known as: LIPITOR Take 1 tablet (20 mg total) by mouth at bedtime.  Indication: Inherited Heterozygous Hypercholesterolemia, High Amount of Triglycerides in the Blood   benztropine 0.5 MG tablet Commonly known as: COGENTIN Take 1 tablet (0.5 mg total) by mouth 2 (two) times daily. For prevention of drug induced tremors  Indication: Extrapyramidal Reaction caused by Medications   cloNIDine 0.2 MG  tablet Commonly known as: CATAPRES Take 1 tablet (0.2 mg total) by mouth 2 (two) times daily.  Indication: High Blood Pressure Disorder        Follow-up recommendations:  Per inpatient orders  Comments:  Per inpatient orders  Signed: Maryfrances Bunnell, FNP 04/24/2019, 11:31 AM

## 2019-04-24 NOTE — Tx Team (Signed)
Initial Treatment Plan 04/24/2019 2:59 PM [name] [id number]    PATIENT STRESSORS: Health problems Medication change or noncompliance   PATIENT STRENGTHS: Average or above average intelligence Motivation for treatment/growth   PATIENT IDENTIFIED PROBLEMS: Medication compliance  Anxiety                   DISCHARGE CRITERIA:  Ability to meet basic life and health needs Adequate post-discharge living arrangements Improved stabilization in mood, thinking, and/or behavior  PRELIMINARY DISCHARGE PLAN: Attend aftercare/continuing care group Outpatient therapy Return to previous living arrangement  PATIENT/FAMILY INVOLVEMENT: This treatment plan has been presented to and reviewed with the patient, Chelsea [name].  The patient and family have been given the opportunity to ask questions and make suggestions.  Wardell Heath, RN 04/24/2019, 2:59 PM

## 2019-04-24 NOTE — Plan of Care (Signed)
Move to 400 hall for 24hr obs- meds adjusted

## 2019-04-24 NOTE — Progress Notes (Signed)
D:  Patient admitted to unit. Pt. Denies SI/HI/AVH. Patient presents with a flat affect and depressed mood.  A: Support and encouragement provided Routine safety checks conducted every 15 minutes. Patient  Informed to notify staff with any concerns.  Safety maintained. R: Patient contracts for safety.   Safety maintained.

## 2019-04-25 LAB — HEMOGLOBIN A1C
Hgb A1c MFr Bld: 5.7 % — ABNORMAL HIGH (ref 4.8–5.6)
Mean Plasma Glucose: 116.89 mg/dL

## 2019-04-25 LAB — LIPID PANEL
Cholesterol: 286 mg/dL — ABNORMAL HIGH (ref 0–200)
HDL: 37 mg/dL — ABNORMAL LOW (ref >40)
LDL Cholesterol: 183 mg/dL — ABNORMAL HIGH (ref 0–99)
Total CHOL/HDL Ratio: 7.7 RATIO
Triglycerides: 328 mg/dL — ABNORMAL HIGH (ref <150)
VLDL: 66 mg/dL — ABNORMAL HIGH (ref 0–40)

## 2019-04-25 LAB — CBC
HCT: 41.3 % (ref 36.0–46.0)
Hemoglobin: 13.3 g/dL (ref 12.0–15.0)
MCH: 30 pg (ref 26.0–34.0)
MCHC: 32.2 g/dL (ref 30.0–36.0)
MCV: 93.2 fL (ref 80.0–100.0)
Platelets: 324 10*3/uL (ref 150–400)
RBC: 4.43 MIL/uL (ref 3.87–5.11)
RDW: 15.2 % (ref 11.5–15.5)
WBC: 7.4 10*3/uL (ref 4.0–10.5)
nRBC: 0 % (ref 0.0–0.2)

## 2019-04-25 LAB — COMPREHENSIVE METABOLIC PANEL
ALT: 38 U/L (ref 0–44)
AST: 26 U/L (ref 15–41)
Albumin: 3.5 g/dL (ref 3.5–5.0)
Alkaline Phosphatase: 83 U/L (ref 38–126)
Anion gap: 7 (ref 5–15)
BUN: 15 mg/dL (ref 6–20)
CO2: 25 mmol/L (ref 22–32)
Calcium: 8.5 mg/dL — ABNORMAL LOW (ref 8.9–10.3)
Chloride: 107 mmol/L (ref 98–111)
Creatinine, Ser: 0.8 mg/dL (ref 0.44–1.00)
GFR calc Af Amer: >60 mL/min (ref >60)
GFR calc non Af Amer: >60 mL/min (ref >60)
Glucose, Bld: 122 mg/dL — ABNORMAL HIGH (ref 70–99)
Potassium: 3.8 mmol/L (ref 3.5–5.1)
Sodium: 139 mmol/L (ref 135–145)
Total Bilirubin: 0.4 mg/dL (ref 0.3–1.2)
Total Protein: 7 g/dL (ref 6.5–8.1)

## 2019-04-25 LAB — ETHANOL: Alcohol, Ethyl (B): <10 mg/dL (ref <10)

## 2019-04-25 LAB — TSH: TSH: 2.536 u[IU]/mL (ref 0.350–4.500)

## 2019-04-25 MED ORDER — NICOTINE POLACRILEX 2 MG MT GUM
2.0000 mg | CHEWING_GUM | OROMUCOSAL | Status: DC | PRN
  Administered 2019-04-25 – 2019-04-29 (×20): 2 mg via ORAL
  Filled 2019-04-25 (×3): qty 1

## 2019-04-25 MED ORDER — CARBAMAZEPINE 100 MG PO CHEW
100.0000 mg | CHEWABLE_TABLET | Freq: Three times a day (TID) | ORAL | Status: DC
  Administered 2019-04-25 – 2019-04-29 (×11): 100 mg via ORAL
  Filled 2019-04-25 (×16): qty 1

## 2019-04-25 NOTE — BHH Counselor (Signed)
Adult Comprehensive Assessment  Patient ID: Chelsea [name], female   DOB: 1970/11/24, 49 y.o.   MRN: [id number]  Information Source: Information source: Patient  Current Stressors:  Patient states their primary concerns and needs for treatment are:: "Whatever they wrote on the paper, I have problems with sleeping" Patient states their goals for this hospitilization and ongoing recovery are:: "I dont know yet" Educational / Learning stressors: None reported Employment / Job issues: Stable employment Family Relationships: No stressor reported Surveyor, quantity / Lack of resources (include bankruptcy): Stable income Housing / Lack of housing: Lives with parents Physical health (include injuries & life threatening diseases): None reported Substance abuse: Patient denies  Living/Environment/Situation:  Living Arrangements: Parent Who else lives in the home?: Patient, parents, nephew How long has patient lived in current situation?: "All my life" What is atmosphere in current home: Comfortable, Supportive  Family History:  Marital status: Single Does patient have children?: No  Childhood History:  By whom was/is the patient raised?: Both parents Description of patient's relationship with caregiver when they were a child: "Normal" Patient's description of current relationship with people who raised him/her: "Normal" Does patient have siblings?: Yes Number of Siblings: 1 Description of patient's current relationship with siblings: Pt reports having a brother but do not talk often Did patient suffer any verbal/emotional/physical/sexual abuse as a child?: No Did patient suffer from severe childhood neglect?: No Has patient ever been sexually abused/assaulted/raped as an adolescent or adult?: No Was the patient ever a victim of a crime or a disaster?: No Witnessed domestic violence?: No Has patient been effected by domestic violence as an adult?: No  Education:  Highest grade of school patient  has completed: Some college Currently a student?: Yes Name of school: [employer] How long has the patient attended?: Pt says she will begin classes in May Learning disability?: No  Employment/Work Situation:   Employment situation: Employed Where is patient currently employed?: 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?: No Are There Guns or Other Weapons in Your Home?: No Are These Weapons Safely Secured?: (Pt denies access)  Financial Resources:   Financial resources: Income from employment Does patient have a representative payee or guardian?: No  Alcohol/Substance Abuse:   What has been your use of drugs/alcohol within the last 12 months?: Patient denies If attempted suicide, did drugs/alcohol play a role in this?: No Alcohol/Substance Abuse Treatment Hx: Denies past history Has alcohol/substance abuse ever caused legal problems?: No  Social Support System:   Patient's Community Support System: Good Describe Community Support System: "Parents, family" Type of faith/religion: None reported  Leisure/Recreation:   Leisure and Hobbies: Sports administrator, cooking, Education officer, environmental"  Strengths/Needs:   What is the patient's perception of their strengths?: "I dont know" Patient states they can use these personal strengths during their treatment to contribute to their recovery: "I dont know"  Discharge Plan:   Currently receiving community mental health services: Yes (From Whom)(Pt reports being followed by [facility] in [place], unable to recall name of provider) Patient states concerns and preferences for aftercare planning are: Pt states she will resume services with current  provider Patient states they will know when they are safe and ready for discharge when: "Im  fine my parents had concerns about my sleep pattern" Does patient have access to transportation?: Yes Does patient have financial barriers related to discharge medications?: No Patient description of barriers related to discharge medications: Patient reports having [employer] Will patient be returning to same living situation after discharge?: Yes(Patient lives with parents)  Summary/Recommendations:   Summary and Recommendations (to be completed by the evaluator): Patient is a 49 yr old female with a diagnosis of Bipolar I disorder, current or most recent episode manic, with psychotic features. Chart review states patient came to the hospital due to medication noncompliance, and hallucinations. Patient denies history of substance use. Patient denies history of trauma and abuse. Patient [facility] and reports she would like to resume treatment with current provider. Recommendations for patient include: crisis stabilization, therapeutic milieu, medication management, attend and participate in group therapy, and development of a comprehensive mental wellness plan.  Chelsea [name] Lynelle Smoke. 04/25/2019

## 2019-04-25 NOTE — H&P (Signed)
Psychiatric Admission Assessment Adult  Patient Identification: Chelsea [name] MRN:  [id number] Date of Evaluation:  04/25/2019 Chief Complaint:  Schizoaffective disorder (HCC) [F25.9] Bipolar 1 disorder (HCC) [F31.9] Principal Diagnosis: Bipolar I disorder, current or most recent episode manic, with psychotic features (HCC) Diagnosis:  Principal Problem:   Bipolar I disorder, current or most recent episode manic, with psychotic features (HCC) Active Problems:   Schizoaffective disorder (HCC)   Bipolar 1 disorder (HCC)  History of Present Illness:  This is the latest of numerous admissions/encounters in her healthcare system and elsewhere for Chelsea [name], who required petition by family members due to medication noncompliance, and hallucinations, poor hygiene and overall deterioration in her condition.  The patient denies all issues on the petition stated she had been transitioned from [place] to [place] and was taking the medications.  She blames family for overreacting and committing her without justification.  Patient elaborates "it is always the same thing when they commit me"  Past medication trials have included Latuda, cariprazine, Abilify, lamotrigine, Depakote, Adderall, Zyprexa.  Ambien and trazodone as needed as well as temazepam as needed  Patient will probably need long-acting injectable  According to assessment counselor note of yesterday she is also: [place] physician  Chelsea [name] is an 49 y.o. female who was brought to [facility] by the police on IVC, petitioned by her family:  Per IVC:  Patient has been diagnosed with Schizoaffective Disorder Bipolar Type and is no-compliant with taking her medication. Patient is not sleeping or attending to her personal hygiene.  She has a history of multiple mental health commitments.  Patient is talking to walls, screaming and having verbal altercations with people who are not there. She has been aggressive with family members.  She has been  distracted and leaving the oven/stove on.  She takes the shared family car for hours at a time and does not let anyone know where she is.  She has gotten lost while driving and returning hours later.  She has been leaving the house at 3 am.  Patient denies everything on the commitment papers and states that her family is lying.  She states that her medications were recently changed.  She states that she was last in the hospital at [facility] Last year and states that she is currently [facility]. Patient states that she has been sleeping and eating well.  She denies SI/HI/Psychosis and states that she has no SA issues.  She denies any history of abuse or self-mutilation.  TTS spoke to petitioner, Randall An [phone], who reports that patient was fine last week, but he states that she has not been taking her medication and her behavior is getting out of control.  He states that she has been aggressive towards family, has been making statements about killing herself or others.  He states that she has been carrying on conversations with herself and he states that she argues and fights with people who are not there.  He said, "her behavior is getting so bad that it cannot be ignored.  He states that she is not bathing or eating and he states that she has been chain smoking.  He states that she has spent all of her money on intranet scams because she cannot identify what is real and what is not.  He states that when she takes her medication that she is a normal person.  Associated Signs/Symptoms: Depression Symptoms:  insomnia, (Hypo) Manic Symptoms:  Distractibility, Hallucinations, Anxiety  Symptoms:  n/a Psychotic Symptoms:  Hallucinations: Auditory PTSD Symptoms: NA Total Time spent with patient: 45 minutes  Past Psychiatric History: multiple past admissions   Is the patient at risk to self? Yes.    Has the patient been a risk to self in the past  6 months? No.  Has the patient been a risk to self within the distant past? Yes.    Is the patient a risk to others? Yes.    Has the patient been a risk to others in the past 6 months? No.  Has the patient been a risk to others within the distant past? No.   Prior Inpatient Therapy: Prior Inpatient Therapy: Yes Prior Therapy Dates: Last was one year ago Prior Therapy Facilty/Provider(s): [facility] Reason for Treatment: Schizoaffective Disorder Prior Outpatient Therapy: Prior Outpatient Therapy: Yes Prior Therapy Dates: active Prior Therapy Facilty/Provider(s): [facility] Reason for Treatment: schizoaffective Does patient have an ACCT team?: No Does patient have Intensive In-House Services?  : No Does patient have [facility] services? : No Does patient have P4CC services?: No  Alcohol Screening: Patient refused Alcohol Screening Tool: Yes 1. How often do you have a drink containing alcohol?: Never 2. How many drinks containing alcohol do you have on a typical day when you are drinking?: 1 or 2 3. How often do you have six or more drinks on one occasion?: Never AUDIT-C Score: 0 4. How often during the last year have you found that you were not able to stop drinking once you had started?: Never 5. How often during the last year have you failed to do what was normally expected from you becasue of drinking?: Never 6. How often during the last year have you needed a first drink in the morning to get yourself going after a heavy drinking session?: Never 7. How often during the last year have you had a feeling of guilt of remorse after drinking?: Never 8. How often during the last year have you been unable to remember what happened the night before because you had been drinking?: Never 9. Have you or someone else been injured as a result of your drinking?: No 10. Has a relative or friend or a doctor or another health worker been concerned about your drinking or suggested you cut down?:  No Alcohol Use Disorder Identification Test Final Score (AUDIT): 0 Substance Abuse History in the last 12 months:  No. Consequences of Substance Abuse: NA Previous Psychotropic Medications: Yes  Psychological Evaluations: No  Past Medical History:  Past Medical History:  Diagnosis Date  . Allergy   . Bipolar disorder (HCC)   . Depression   . Eczema   . Elevated cholesterol   . Hypertension   . Schizophrenia [facility])     Past Surgical History:  Procedure Laterality Date  . NO PAST SURGERIES     Family History:  Family History  Problem Relation Age of Onset  . Breast cancer Mother   . Hypertension Father   . Diabetes Paternal Grandmother   . Colon cancer Neg Hx   . CAD Neg Hx    Family Psychiatric  History: neg Tobacco Screening:   Social History:  Social History   Substance and Sexual Activity  Alcohol Use Yes   Comment:  socially , rare      Social History   Substance and Sexual Activity  Drug Use No    Additional Social History: Marital status: Single    Pain Medications: See MAR Prescriptions: See [facility]  Over the Counter: See MAR History of alcohol / drug use?: No history of alcohol / drug abuse Longest period of sobriety (when/how long): NA                    Allergies:   Allergies  Allergen Reactions  . Divalproex Sodium Er Other (See Comments)    Delirium with hyperammonemia  . Aspirin     REACTION: swelling  . Fish Allergy    Lab Results:  Results for orders placed or performed during the hospital encounter of 04/23/19 (from the past 48 hour(s))  Respiratory Panel by RT PCR (Flu A&B, Covid) - Nasopharyngeal Swab     Status: None   Collection Time: 04/23/19  4:11 PM   Specimen: Nasopharyngeal Swab  Result Value Ref Range   SARS Coronavirus 2 by RT PCR NEGATIVE NEGATIVE    Comment: (NOTE) SARS-CoV-2 target nucleic acids are NOT DETECTED. The SARS-CoV-2 RNA is generally detectable in upper respiratoy specimens during the acute phase of  infection. The lowest concentration of SARS-CoV-2 viral copies this assay can detect is 131 copies/mL. A negative result does not preclude SARS-Cov-2 infection and should not be used as the sole basis for treatment or other patient management decisions. A negative result may occur with  improper specimen collection/handling, submission of specimen other than nasopharyngeal swab, presence of viral mutation(s) within the areas targeted by this assay, and inadequate number of viral copies (<131 copies/mL). A negative result must be combined with clinical observations, patient history, and epidemiological information. The expected result is Negative. Fact Sheet for Patients:  https://www.moore.com/https://www.fda.gov/media/142436/download Fact Sheet for Healthcare Providers:  https://www.young.biz/https://www.fda.gov/media/142435/download This test is not yet ap proved or cleared by the [place] FDA and  has been authorized for detection and/or diagnosis of SARS-CoV-2 by FDA under an Emergency Use Authorization (EUA). This EUA will remain  in effect (meaning this test can be used) for the duration of the COVID-19 declaration under Section 564(b)(1) of the Act, 21 U.S.C. section 360bbb-3(b)(1), unless the authorization is terminated or revoked sooner.    Influenza A by PCR NEGATIVE NEGATIVE   Influenza B by PCR NEGATIVE NEGATIVE    Comment: (NOTE) The Xpert Xpress SARS-CoV-2/FLU/RSV assay is intended as an aid in  the diagnosis of influenza from Nasopharyngeal swab specimens and  should not be used as a sole basis for treatment. Nasal washings and  aspirates are unacceptable for Xpert Xpress SARS-CoV-2/FLU/RSV  testing. Fact Sheet for Patients: https://www.moore.com/https://www.fda.gov/media/142436/download Fact Sheet for Healthcare Providers: https://www.young.biz/https://www.fda.gov/media/142435/download This test is not yet approved or cleared by the [place] FDA and  has been authorized for detection and/or diagnosis of SARS-CoV-2 by  FDA under an Emergency  Use Authorization (EUA). This EUA will remain  in effect (meaning this test can be used) for the duration of the  Covid-19 declaration under Section 564(b)(1) of the Act, 21  U.S.C. section 360bbb-3(b)(1), unless the authorization is  terminated or revoked. Performed at [facility], [address] [place], [place] [address]     Blood Alcohol level:  Lab Results  Component [facility] Date   [facility]   ETH <10 11/06/2017    Metabolic Disorder Labs:  Lab Results  Component Value Date   HGBA1C 5.6 12/14/2016   No results found for: PROLACTIN Lab Results  Component Value Date   CHOL 203 (H) 07/09/2018   TRIG 147.0 07/09/2018   HDL 46.60 07/09/2018   CHOLHDL 4 07/09/2018   VLDL 29.4 07/09/2018  [place] 127 (H) 07/09/2018   [place] 65 04/15/2009    Current Medications: Current Facility-Administered Medications  Medication Dose Route Frequency Provider Last Rate Last Admin  . acetaminophen (TYLENOL) tablet 650 mg  650 mg Oral Q6H PRN Emmaline Kluver, FNP   650 mg at 04/25/19 0117  . alum & mag hydroxide-simeth (MAALOX/MYLANTA) 200-200-20 MG/5ML suspension 30 mL  30 mL Oral Q4H PRN Emmaline Kluver, FNP      . amLODipine (NORVASC) tablet 10 mg  10 mg Oral Daily Anike, Adaku C, NP   10 mg at 04/25/19 0826  . atorvastatin (LIPITOR) tablet 20 mg  20 mg Oral QHS Mordecai Maes, NP      . benztropine (COGENTIN) tablet 0.5 mg  0.5 mg Oral BID Johnn Hai, MD   0.5 mg at 04/25/19 0826  . cloNIDine (CATAPRES) tablet 0.2 mg  0.2 mg Oral BID Mordecai Maes, NP   0.2 mg at 04/25/19 0827  . hydrOXYzine (ATARAX/VISTARIL) tablet 25 mg  25 mg Oral TID PRN Emmaline Kluver, FNP   25 mg at 04/25/19 0116  . LORazepam (ATIVAN) tablet 2 mg  2 mg Oral Q6H PRN Johnn Hai, MD       Or  . LORazepam (ATIVAN) injection 2 mg  2 mg Intramuscular Q6H PRN Johnn Hai, MD      . magnesium hydroxide (MILK OF MAGNESIA) suspension 30 mL  30 mL Oral Daily PRN Emmaline Kluver, FNP      .  risperiDONE (RISPERDAL) tablet 3 mg  3 mg Oral BID Johnn Hai, MD   3 mg at 04/25/19 0826  . temazepam (RESTORIL) capsule 30 mg  30 mg Oral QHS Johnn Hai, MD      . traZODone (DESYREL) tablet 50 mg  50 mg Oral QHS PRN Emmaline Kluver, FNP   50 mg at 04/25/19 0117   PTA Medications: Medications Prior to Admission  Medication Sig Dispense Refill Last Dose  . cloNIDine (CATAPRES) 0.2 MG tablet Take 1 tablet (0.2 mg total) by mouth 2 (two) times daily. 180 tablet 1   . lurasidone (LATUDA) 40 MG TABS tablet Take 40 mg by mouth daily with breakfast.     . amLODipine (NORVASC) 10 MG tablet Take 1 tablet (10 mg total) by mouth daily. (Patient not taking: Reported on 04/24/2019) 30 tablet 6 Not Taking at Unknown time  . atorvastatin (LIPITOR) 20 MG tablet Take 1 tablet (20 mg total) by mouth at bedtime. (Patient not taking: Reported on 04/24/2019) 90 tablet 1 Not Taking at Unknown time  . augmented betamethasone dipropionate (DIPROLENE-AF) 0.05 % cream Apply topically 2 (two) times daily. As needed for eczema, hands (Patient not taking: Reported on 04/24/2019) 60 g 1 Not Taking at Unknown time  . benztropine (COGENTIN) 0.5 MG tablet Take 1 tablet (0.5 mg total) by mouth 2 (two) times daily. For prevention of drug induced tremors (Patient not taking: Reported on 04/24/2019) 60 tablet 0 Not Taking at Unknown time    Musculoskeletal: Strength & Muscle Tone: within normal limits Gait & Station: normal Patient leans: N/A  Psychiatric Specialty Exam: Physical Exam  Nursing note and vitals reviewed. Constitutional: She appears well-developed and well-nourished.  Cardiovascular: Normal rate and regular rhythm.    Review of Systems  Constitutional: Negative.   Eyes: Negative.   Respiratory: Negative.   Cardiovascular: Negative.   Gastrointestinal: Negative.   Endocrine: Negative.   Genitourinary: Negative.   Musculoskeletal: Negative.     Blood pressure (!) 136/98, pulse Marland Kitchen)  113, temperature 98.6 F  (37 C), temperature source Oral, resp. rate 18, height 5\' 6"  (1.676 m), weight 87.1 kg, SpO2 96 %.Body mass index is 30.99 kg/m.  General Appearance: Casual  Eye Contact:  Good  Speech:  Clear and Coherent  Volume:  Normal  Mood:  Irritable  Affect:  Constricted  Thought Process:  Goal Directed and Descriptions of Associations: Circumstantial  Orientation:  Full (Time, Place, and Person)  Thought Content:  Rumination  Suicidal Thoughts:  No  Homicidal Thoughts:  No  Memory:  Immediate;   Poor Recent;   Fair Remote;   Fair  Judgement:  Fair  Insight:  Shallow  Psychomotor Activity:  Normal  Concentration:  Concentration: Poor and Attention Span: Fair  Recall:  of Knowledge:  Fair  Language:  Good  Akathisia:  Negative  Handed:  Right  AIMS (if indicated):     Assets:  Communication Skills Intimacy Physical Health Resilience Social Support  ADL's:  Intact  Cognition:  WNL  Sleep:  Number of Hours: 8.5    Treatment Plan Summary: Daily contact with patient to assess and evaluate symptoms and progress in treatment and Medication management  Observation Level/Precautions:  15 minute checks  Laboratory:  UDS  Psychotherapy: Cognitive and reality based  Medications: Begin risperidone therapy/probable long-acting injectable aripiprazole prior to discharge discussed risk-benefit side effects further  Consultations:    Discharge Concerns: Longer-term compliance  Estimated LOS: 5-7  Other:     Physician Treatment Plan for Primary Diagnosis: Bipolar I disorder, current or most recent episode manic, with psychotic features (HCC)   Axis I bipolar/schizoaffective with recent noncompliance by report Awaiting drug screen Axis II deferred Axis III hypertension Plans again begin antipsychotic/mood stabilizer therapy  Long Term Goal(s): Improvement in symptoms so as ready for discharge  Short Term Goals: Ability to identify changes in lifestyle to reduce recurrence of  condition will improve, Ability to verbalize feelings will improve and Ability to disclose and discuss suicidal ideas  Physician Treatment Plan for Secondary Diagnosis: Principal Problem:   Bipolar I disorder, current or most recent episode manic, with psychotic features (HCC) Active Problems:   Schizoaffective disorder (HCC)   Bipolar 1 disorder (HCC)  Long Term Goal(s): Improvement in symptoms so as ready for discharge  Short Term Goals: Ability to disclose and discuss suicidal ideas, Ability to demonstrate self-control will improve and Ability to identify and develop effective coping behaviors will improve  I certify that inpatient services furnished can reasonably be expected to improve the patient's condition.    [employer], MD 4/16/20219:16 AM

## 2019-04-25 NOTE — Tx Team (Addendum)
Interdisciplinary Treatment and Diagnostic Plan Update  04/25/2019 Time of Session: 9233 Chelsea [name] MRN: [id number]  Principal Diagnosis: Bipolar I disorder, current or most recent episode manic, with psychotic features ([place])  Secondary Diagnoses: Principal Problem:   Bipolar I disorder, current or most recent episode manic, with psychotic features ([place]) Active Problems:   Schizoaffective disorder ([place])   Bipolar 1 disorder ([place])   Current Medications:  Current Facility-Administered Medications  Medication Dose Route Frequency Provider Last Rate Last Admin  . acetaminophen (TYLENOL) tablet 650 mg  650 mg Oral Q6H PRN Emmaline Kluver, FNP   650 mg at 04/25/19 0117  . alum & mag hydroxide-simeth (MAALOX/MYLANTA) 200-200-20 MG/5ML suspension 30 mL  30 mL Oral Q4H PRN Emmaline Kluver, FNP      . amLODipine (NORVASC) tablet 10 mg  10 mg Oral Daily Anike, Adaku C, NP   10 mg at 04/25/19 0826  . atorvastatin (LIPITOR) tablet 20 mg  20 mg Oral QHS Mordecai Maes, NP      . benztropine (COGENTIN) tablet 0.5 mg  0.5 mg Oral BID Johnn Hai, MD   0.5 mg at 04/25/19 3545  . carbamazepine (TEGRETOL) chewable tablet 100 mg  100 mg Oral TID Johnn Hai, MD   100 mg at 04/25/19 1207  . cloNIDine (CATAPRES) tablet 0.2 mg  0.2 mg Oral BID Mordecai Maes, NP   0.2 mg at 04/25/19 0827  . hydrOXYzine (ATARAX/VISTARIL) tablet 25 mg  25 mg Oral TID PRN Emmaline Kluver, FNP   25 mg at 04/25/19 0116  . LORazepam (ATIVAN) tablet 2 mg  2 mg Oral Q6H PRN Johnn Hai, MD       Or  . LORazepam (ATIVAN) injection 2 mg  2 mg Intramuscular Q6H PRN Johnn Hai, MD      . magnesium hydroxide (MILK OF MAGNESIA) suspension 30 mL  30 mL Oral Daily PRN Emmaline Kluver, FNP      . risperiDONE (RISPERDAL) tablet 3 mg  3 mg Oral BID Johnn Hai, MD   3 mg at 04/25/19 0826  . temazepam (RESTORIL) capsule 30 mg  30 mg Oral QHS Johnn Hai, MD      . traZODone (DESYREL) tablet 50 mg  50 mg Oral QHS PRN Emmaline Kluver, FNP   50 mg  at 04/25/19 0117   PTA Medications: Medications Prior to Admission  Medication Sig Dispense Refill Last Dose  . cloNIDine (CATAPRES) 0.2 MG tablet Take 1 tablet (0.2 mg total) by mouth 2 (two) times daily. 180 tablet 1   . lurasidone (LATUDA) 40 MG TABS tablet Take 40 mg by mouth daily with breakfast.     . amLODipine (NORVASC) 10 MG tablet Take 1 tablet (10 mg total) by mouth daily. (Patient not taking: Reported on 04/24/2019) 30 tablet 6 Not Taking at Unknown time  . atorvastatin (LIPITOR) 20 MG tablet Take 1 tablet (20 mg total) by mouth at bedtime. (Patient not taking: Reported on 04/24/2019) 90 tablet 1 Not Taking at Unknown time  . augmented betamethasone dipropionate (DIPROLENE-AF) 0.05 % cream Apply topically 2 (two) times daily. As needed for eczema, hands (Patient not taking: Reported on 04/24/2019) 60 g 1 Not Taking at Unknown time  . benztropine (COGENTIN) 0.5 MG tablet Take 1 tablet (0.5 mg total) by mouth 2 (two) times daily. For prevention of drug induced tremors (Patient not taking: Reported on 04/24/2019) 60 tablet 0 Not Taking at Unknown time    Patient Stressors: Health problems Medication change or  noncompliance  Patient Strengths: Average or above average intelligence Motivation for treatment/growth  Treatment Modalities: Medication Management, Group therapy, Case management,  1 to 1 session with clinician, Psychoeducation, Recreational therapy.   Physician Treatment Plan for Primary Diagnosis: Bipolar I disorder, current or most recent episode manic, with psychotic features ([place]) Long Term Goal(s): Improvement in symptoms so as ready for discharge Improvement in symptoms so as ready for discharge   Short Term Goals: Ability to identify changes in lifestyle to reduce recurrence of condition will improve Ability to verbalize feelings will improve Ability to disclose and discuss suicidal ideas Ability to disclose and discuss suicidal ideas Ability to demonstrate  self-control will improve Ability to identify and develop effective coping behaviors will improve  Medication Management: Evaluate patient's response, side effects, and tolerance of medication regimen.  Therapeutic Interventions: 1 to 1 sessions, Unit Group sessions and Medication administration.  Evaluation of Outcomes: Not Met  Physician Treatment Plan for Secondary Diagnosis: Principal Problem:   Bipolar I disorder, current or most recent episode manic, with psychotic features ([place]) Active Problems:   Schizoaffective disorder ([place])   Bipolar 1 disorder ([place])  Long Term Goal(s): Improvement in symptoms so as ready for discharge Improvement in symptoms so as ready for discharge   Short Term Goals: Ability to identify changes in lifestyle to reduce recurrence of condition will improve Ability to verbalize feelings will improve Ability to disclose and discuss suicidal ideas Ability to disclose and discuss suicidal ideas Ability to demonstrate self-control will improve Ability to identify and develop effective coping behaviors will improve     Medication Management: Evaluate patient's response, side effects, and tolerance of medication regimen.  Therapeutic Interventions: 1 to 1 sessions, Unit Group sessions and Medication administration.  Evaluation of Outcomes: Not Met   RN Treatment Plan for Primary Diagnosis: Bipolar I disorder, current or most recent episode manic, with psychotic features ([place]) Long Term Goal(s): Knowledge of disease and therapeutic regimen to maintain health will improve  Short Term Goals: Ability to identify and develop effective coping behaviors will improve and Compliance with prescribed medications will improve  Medication Management: RN will administer medications as ordered by provider, will assess and evaluate patient's response and provide education to patient for prescribed medication. RN will report any adverse and/or side effects to prescribing  provider.  Therapeutic Interventions: 1 on 1 counseling sessions, Psychoeducation, Medication administration, Evaluate responses to treatment, Monitor vital signs and CBGs as ordered, Perform/monitor CIWA, COWS, AIMS and Fall Risk screenings as ordered, Perform wound care treatments as ordered.  Evaluation of Outcomes: Not Met   LCSW Treatment Plan for Primary Diagnosis: Bipolar I disorder, current or most recent episode manic, with psychotic features ([place]) Long Term Goal(s): Safe transition to appropriate next level of care at discharge, Engage patient in therapeutic group addressing interpersonal concerns.  Short Term Goals: Engage patient in aftercare planning with referrals and resources, Increase social support and Increase skills for wellness and recovery  Therapeutic Interventions: Assess for all discharge needs, 1 to 1 time with Social worker, Explore available resources and support systems, Assess for adequacy in community support network, Educate family and significant other(s) on suicide prevention, Complete Psychosocial Assessment, Interpersonal group therapy.  Evaluation of Outcomes: Not Met   Progress in Treatment: Attending groups: No. Participating in groups: No. Taking medication as prescribed: No. Toleration medication: No. Family/Significant other contact made: No, will contact:  when given permission Patient understands diagnosis: No. Discussing patient identified problems/goals with staff: Yes. Medical problems stabilized or  resolved: Yes. Denies suicidal/homicidal ideation: Yes. Issues/concerns per patient self-inventory: No. Other: none  New problem(s) identified: No, Describe:  none  New Short Term/Long Term Goal(s):  Patient Goals:  Pt did not identify a goal.   Discharge Plan or Barriers:   Reason for Continuation of Hospitalization: Hallucinations Medication stabilization  Estimated Length of Stay: 5-7 days.  Attendees: Patient:Chelsea [name]  04/25/2019   Physician: Dr Jake Samples, MD 04/25/2019   Nursing: Real Cons, RN 04/25/2019   RN Care Manager: 04/25/2019   Social Worker: Lurline Idol, LCSW 04/25/2019   Recreational Therapist:  04/25/2019   Other:  04/25/2019   Other:  04/25/2019   Other: 04/25/2019        Scribe for Treatment Team: Joanne Chars, [place] 04/25/2019 2:42 PM

## 2019-04-25 NOTE — [facility] Suicide Risk Assessment (Addendum)
[facility] INPATIENT:  Family/Significant Other Suicide Prevention Education  Suicide Prevention Education:  Contact Attempts: Charleston Ropes, mother [phone] has been identified by the patient as the family member/significant other with whom the patient will be residing, and identified as the person(s) who will aid the patient in the event of a mental health crisis.  With written consent from the patient, two attempts were made to provide suicide prevention education, prior to and/or following the patient's discharge.  We were unsuccessful in providing suicide prevention education.  A suicide education pamphlet was given to the patient to share with family/significant other.  Date and time of first attempt: Using Spanish interpreter through [facility]... 04/25/2019 at 2:40pm. A voicemail was left with callback information.  Date and time of second attempt: Using Spanish interpreter through [facility] [employer] 04/28/2019 at 10:10am.  SPE pamphlet placed on chart for patient to share with supports.  Darreld Mclean 04/25/2019, 2:47 PM

## 2019-04-25 NOTE — [facility] Group Notes (Signed)
Balance In Life 04/25/2019 10:30am  Type of Therapy/Topic:  Group Therapy:  Balance in Life  Participation Level:  Did Not Attend  Description of Group:   This group will address the concept of balance and how it feels and looks when one is unbalanced. Patients will be encouraged to process areas in their lives that are out of balance and identify reasons for remaining unbalanced. Facilitators will guide patients in utilizing problem-solving interventions to address and correct the stressor making their life unbalanced. Understanding and applying boundaries will be explored and addressed for obtaining and maintaining a balanced life. Patients will be encouraged to explore ways to assertively make their unbalanced needs known to significant others in their lives, using other group members and facilitator for support and feedback.  Therapeutic Goals: 1. Patient will identify two or more emotions or situations they have that consume much of in their lives. 2. Patient will identify signs/triggers that life has become out of balance:  3. Patient will identify two ways to set boundaries in order to achieve balance in their lives:  4. Patient will demonstrate ability to communicate their needs through discussion and/or role plays  Summary of Patient Progress:    Therapeutic Modalities:   Cognitive Behavioral Therapy Solution-Focused Therapy Assertiveness Training  Umaiza Matusik T Derran Sear, LCSW  

## 2019-04-25 NOTE — Progress Notes (Signed)
DAR NOTE: Patient presents with blunted affect and irritable mood.  Denies suicidal thoughts, pain, auditory and visual hallucinations.  Described energy level as normal and concentration as good.  Rates depression at 2, hopelessness at 0, and anxiety at 2.  Maintained on routine safety checks.  Medications given as prescribed.  Support and encouragement offered as needed.     Patient visible in milieu briefly with minimal interaction.  Patient is safe on and off the unit.

## 2019-04-25 NOTE — [facility] Suicide Risk Assessment (Signed)
[facility] Admission Suicide Risk Assessment   Nursing information obtained from:  Patient Demographic factors:  NA Current Mental Status:  NA Loss Factors:  NA Historical Factors:  NA Risk Reduction Factors:  NA  Total Time spent with patient: 45 minutes Principal Problem: Bipolar I disorder, current or most recent episode manic, with psychotic features ([facility]) Diagnosis:  Principal Problem:   Bipolar I disorder, current or most recent episode manic, with psychotic features ([facility]) Active Problems:   Schizoaffective disorder ([facility])   Bipolar 1 disorder ([facility])  Subjective Data:  dmitted for stabalization Continued Clinical Symptoms:  Alcohol Use Disorder Identification Test Final Score (AUDIT): 0 The "Alcohol Use Disorders Identification Test", Guidelines for Use in Primary Care, Second Edition.  World Science writer [facility]). Score between 0-7:  no or low risk or alcohol related problems. Score between 8-15:  moderate risk of alcohol related problems. Score between 16-19:  high risk of alcohol related problems. Score 20 or above:  warrants further diagnostic evaluation for alcohol dependence and treatment.   CLINICAL FACTORS:   Bipolar Disorder:   Mixed State  Musculoskeletal: Strength & Muscle Tone: within normal limits Gait & Station: normal Patient leans: N/A  Psychiatric Specialty Exam: Physical Exam  Nursing note and vitals reviewed. Constitutional: She appears well-developed and well-nourished.  Cardiovascular: Normal rate and regular rhythm.    Review of Systems  Constitutional: Negative.   Eyes: Negative.   Respiratory: Negative.   Cardiovascular: Negative.   Gastrointestinal: Negative.   Endocrine: Negative.   Genitourinary: Negative.   Musculoskeletal: Negative.     Blood pressure (!) 136/98, pulse (!) 113, temperature 98.6 F (37 C), temperature source Oral, resp. rate 18, height 5\' 6"  (1.676 m), weight 87.1 kg, SpO2 96 %.Body mass index is 30.99 kg/m.   General Appearance: Casual  Eye Contact:  Good  Speech:  Clear and Coherent  Volume:  Normal  Mood:  Irritable  Affect:  Constricted  Thought Process:  Goal Directed and Descriptions of Associations: Circumstantial  Orientation:  Full (Time, Place, and Person)  Thought Content:  Rumination  Suicidal Thoughts:  No  Homicidal Thoughts:  No  Memory:  Immediate;   Poor Recent;   Fair Remote;   Fair  Judgement:  Fair  Insight:  Shallow  Psychomotor Activity:  Normal  Concentration:  Concentration: Poor and Attention Span: Fair  Recall:  of Knowledge:  Fair  Language:  Good  Akathisia:  Negative  Handed:  Right  AIMS (if indicated):     Assets:  Communication Skills Intimacy Physical Health Resilience Social Support  ADL's:  Intact  Cognition:  WNL  Sleep:  Number of Hours: 8.5    Treatment Plan Summary: Daily contact with patient to assess and evaluate symptoms and progress in treatment and Medication management  Observation Level/Precautions:  15 minute checks  Laboratory:  UDS  Psychotherapy: Cognitive and reality based  Medications: Begin risperidone therapy/probable long-acting injectable aripiprazole prior to discharge discussed risk-benefit side effects further  Consultations:    Discharge Concerns: Longer-term compliance  Estimated LOS: 5-7  Other:       COGNITIVE FEATURES THAT CONTRIBUTE TO RISK:  Closed-mindedness    SUICIDE RISK:   Minimal: No identifiable suicidal ideation.  Patients presenting with no risk factors but with morbid ruminations; may be classified as minimal risk based on the severity of the depressive symptoms  PLAN OF CARE: see eval  I certify that inpatient services furnished can reasonably be expected to improve the patient's condition.  Johnn Hai, MD 04/25/2019, 9:35 AM

## 2019-04-25 NOTE — Progress Notes (Signed)
SPIRITUALITY GROUP NOTE  Spirituality group facilitated by Chaplain Jerrico Covello, MDiv, B[facility].  Group Description:  Group focused on topic of hope.  Patients participated in facilitated discussion around topic, connecting with one another around experiences and definitions for hope.  Group members engaged with visual explorer photos, reflecting on what hope looks like for them today.  Group engaged in discussion around how their definitions of hope are present today in hospital.   Modalities: Psycho-social ed, Adlerian, Narrative, MI Patient Progress: Did not attend 

## 2019-04-25 NOTE — Progress Notes (Signed)
   04/25/19 2020  COVID-19 Daily Checkoff  Have you had a fever (temp > 37.80C/100F)  in the past 24 hours?  No  COVID-19 EXPOSURE  Have you traveled outside the state in the past 14 days? No  Have you been in contact with someone with a confirmed diagnosis of COVID-19 or PUI in the past 14 days without wearing appropriate PPE? No  Have you been living in the same home as a person with confirmed diagnosis of COVID-19 or a PUI (household contact)? No  Have you been diagnosed with COVID-19? No

## 2019-04-26 DIAGNOSIS — F312 Bipolar disorder, current episode manic severe with psychotic features: Secondary | ICD-10-CM

## 2019-04-26 LAB — RAPID URINE DRUG SCREEN, HOSP PERFORMED
Amphetamines: NOT DETECTED
Barbiturates: NOT DETECTED
Benzodiazepines: POSITIVE — AB
Cocaine: NOT DETECTED
Opiates: NOT DETECTED
Tetrahydrocannabinol: NOT DETECTED

## 2019-04-26 LAB — PREGNANCY, URINE: Preg Test, Ur: NEGATIVE

## 2019-04-26 LAB — PROLACTIN: Prolactin: 102 ng/mL — ABNORMAL HIGH (ref 4.8–23.3)

## 2019-04-26 MED ORDER — RISPERIDONE 2 MG PO TABS
4.0000 mg | ORAL_TABLET | Freq: Two times a day (BID) | ORAL | Status: DC
  Administered 2019-04-26 – 2019-04-29 (×6): 4 mg via ORAL
  Filled 2019-04-26 (×8): qty 2

## 2019-04-26 MED ORDER — LORAZEPAM 1 MG PO TABS
1.0000 mg | ORAL_TABLET | Freq: Once | ORAL | Status: AC
  Administered 2019-04-26: 1 mg via ORAL
  Filled 2019-04-26 (×2): qty 1

## 2019-04-26 MED ORDER — CLONIDINE HCL 0.1 MG PO TABS
0.1000 mg | ORAL_TABLET | Freq: Once | ORAL | Status: AC
  Administered 2019-04-26: 0.1 mg via ORAL
  Filled 2019-04-26 (×2): qty 1

## 2019-04-26 NOTE — Progress Notes (Signed)
Adult Psychoeducational Group Note  Date:  04/26/2019 Time:  10:17 PM  Group Topic/Focus:  Wrap-Up Group:   The focus of this group is to help patients review their daily goal of treatment and discuss progress on daily workbooks.  Participation Level:  Did Not Attend  Participation Quality:  Did Not Attend  Affect:  Did Not Attend  Cognitive:  Did Not Attend  Insight: None  Engagement in Group:  Did Not Attend  Modes of Intervention:  Did Not Attend  Additional Comments:  Pt did not attend evening wrap up group tonight.  Felipa Furnace 04/26/2019, 10:17 PM

## 2019-04-26 NOTE — Progress Notes (Signed)
Pt rated her day a 7 on a scale of 0-10. She reported it wasn't a 10 because she's been sleeping in her room for the majority of the day. This Clinical research associate asked pt if she had attended group and she stated that no one came and got her for it. She describes being sleepy/tired although she's been in bed all day. Pt was being disruptive at night and talking loud at the nurses station. Pt appeared irritated when asked to return to her room. Pt denied feeling anxious.  Medications were administered as ordered by MD. Pt c/o mild lower back pain for which her PRN tylenol was administered. Trazadone PRN was also administered to help her sleep. Pt has been resting comfortably. No distress has been noted.   Q 15 minute safety checks continue. Pt denies SI/HI and AH/VH. Pt remains safe on the unit.

## 2019-04-26 NOTE — Progress Notes (Signed)
Pt's bp elevated this morning 161/105.  PMH-NP ordered medication to lower pt's BP. BP is improving and last check was 146/91.  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  RN provided support and administered medications per MD and PMHNP orders.

## 2019-04-26 NOTE — Progress Notes (Signed)
Pt was pacing in the hallway, being loud, and disruptive. Pt said "I get up at this time, and you can call my parents to ask them." Pt was redirected to room and educated about unit schedule. Pt verbalized understanding and agreed to stay in room until further notice. Q 15 minute safety checks continue.

## 2019-04-26 NOTE — Progress Notes (Signed)
[facility] MD Progress Note  04/26/2019 7:27 AM Chelsea [name]  MRN:  [id number] Subjective:  "I'm fine."  Chelsea [name] has been standing in the hallway, looking through window into dayroom. She continues to deny allegation on IVC paperwork (decreased sleep and hygiene and hallucinations). Per nursing report she was pacing in the hallway overnight and was talking loudly, disruptive to milieu. When asked about this, patient states, "I always get up at 3am." She states she slept during the day yesterday. She reports stable mood but per nursing report has been irritable, labile. She denies SI/HI/AVH and shows no signs of responding to internal stimuli. Risperdal and Tegretol were started yesterday. Blood pressure elevated this morning (169/105). Patient received clonidine, Norvasc- nursing staff to recheck.  From admission H&P: This is the latest of numerous admissions/encounters in her healthcare system and elsewhere for [name], who required petition by family members due to medication noncompliance, and hallucinations, poor hygiene and overall deterioration in her condition.  Principal Problem: Bipolar I disorder, current or most recent episode manic, with psychotic features (HCC) Diagnosis: Principal Problem:   Bipolar I disorder, current or most recent episode manic, with psychotic features (HCC) Active Problems:   Schizoaffective disorder (HCC)   Bipolar 1 disorder (HCC)  Total Time spent with patient: 15 minutes  Past Psychiatric History: See admission H&P   Past Medical History:  Past Medical History:  Diagnosis Date  . Allergy   . Bipolar disorder (HCC)   . Depression   . Eczema   . Elevated cholesterol   . Hypertension   . Schizophrenia [facility])     Past Surgical History:  Procedure Laterality Date  . NO PAST SURGERIES     Family History:  Family History  Problem Relation Age of Onset  . Breast cancer Mother   . Hypertension Father   . Diabetes Paternal Grandmother   . Colon  cancer Neg Hx   . CAD Neg Hx    Family Psychiatric  History: See admission H&P Social History:  Social History   Substance and Sexual Activity  Alcohol Use Yes   Comment:  socially , rare      Social History   Substance and Sexual Activity  Drug Use No    Social History   Socioeconomic History  . Marital status: Divorced    Spouse name: Not on file  . Number of children: Not on file  . Years of education: Not on file  . Highest education level: Not on file  Occupational History  . Occupation: disability   Tobacco Use  . Smoking status: Current Every Day Smoker    Packs/day: 1.50    Types: Cigarettes  . Smokeless tobacco: Never Used  Substance and Sexual Activity  . Alcohol use: Yes    Comment:  socially , rare   . Drug use: No  . Sexual activity: Not Currently  Other Topics Concern  . Not on file  Social History Narrative   [name]   Lives w/ parents    Father Annikah Lovins    Social Determinants of Health   Financial Resource Strain:   . Difficulty of Paying Living Expenses:   Food Insecurity:   . Worried About Programme researcher, broadcasting/film/video in the Last Year:   . Barista in the Last Year:   Transportation Needs:   . Freight forwarder (Medical):   Marland Kitchen Lack of Transportation (Non-Medical):   Physical Activity:   . Days of Exercise per Week:   .  Minutes of Exercise per Session:   Stress:   . Feeling of Stress :   Social Connections:   . Frequency of Communication with Friends and Family:   . Frequency of Social Gatherings with Friends and Family:   . Attends Religious Services:   . Active Member of Clubs or Organizations:   . Attends Banker Meetings:   Marland Kitchen Marital Status:    Additional Social History:    Pain Medications: See MAR Prescriptions: See MAR Over the Counter: See MAR History of alcohol / drug use?: No history of alcohol / drug abuse Longest period of sobriety (when/how long): NA                    Sleep:  Poor  Appetite:  Good  Current Medications: Current Facility-Administered Medications  Medication Dose Route Frequency Provider Last Rate Last Admin  . acetaminophen (TYLENOL) tablet 650 mg  650 mg Oral Q6H PRN Patrcia Dolly, FNP   650 mg at 04/25/19 2349  . alum & mag hydroxide-simeth (MAALOX/MYLANTA) 200-200-20 MG/5ML suspension 30 mL  30 mL Oral Q4H PRN Patrcia Dolly, FNP      . amLODipine (NORVASC) tablet 10 mg  10 mg Oral Daily Anike, Adaku C, NP   10 mg at 04/25/19 0826  . atorvastatin (LIPITOR) tablet 20 mg  20 mg Oral QHS Denzil Magnuson, NP   20 mg at 04/25/19 2154  . benztropine (COGENTIN) tablet 0.5 mg  0.5 mg Oral BID Malvin Johns, MD   0.5 mg at 04/25/19 1658  . carbamazepine (TEGRETOL) chewable tablet 100 mg  100 mg Oral TID Malvin Johns, MD   100 mg at 04/25/19 1658  . cloNIDine (CATAPRES) tablet 0.2 mg  0.2 mg Oral BID Denzil Magnuson, NP   0.2 mg at 04/25/19 1659  . hydrOXYzine (ATARAX/VISTARIL) tablet 25 mg  25 mg Oral TID PRN Patrcia Dolly, FNP   25 mg at 04/25/19 0116  . LORazepam (ATIVAN) tablet 2 mg  2 mg Oral Q6H PRN Malvin Johns, MD       Or  . LORazepam (ATIVAN) injection 2 mg  2 mg Intramuscular Q6H PRN Malvin Johns, MD      . magnesium hydroxide (MILK OF MAGNESIA) suspension 30 mL  30 mL Oral Daily PRN Patrcia Dolly, FNP      . nicotine polacrilex (NICORETTE) gum 2 mg  2 mg Oral PRN Malvin Johns, MD   2 mg at 04/26/19 0438  . risperiDONE (RISPERDAL) tablet 3 mg  3 mg Oral BID Malvin Johns, MD   3 mg at 04/25/19 0826  . temazepam (RESTORIL) capsule 30 mg  30 mg Oral QHS Malvin Johns, MD   30 mg at 04/25/19 2154  . traZODone (DESYREL) tablet 50 mg  50 mg Oral QHS PRN Patrcia Dolly, FNP   50 mg at 04/26/19 0007    Lab Results:  Results for orders placed or performed during the hospital encounter of 04/23/19 (from the past 48 hour(s))  CBC     Status: None   Collection Time: 04/25/19  6:00 PM  Result Value Ref Range   WBC 7.4 4.0 - 10.5 K/uL   RBC 4.43 3.87 - 5.11  MIL/uL   Hemoglobin 13.3 12.0 - 15.0 g/dL   HCT 92.3 30.0 - 76.2 %   MCV 93.2 80.0 - 100.0 fL   MCH 30.0 26.0 - 34.0 pg   MCHC 32.2 30.0 - 36.0 g/dL   RDW 26.3  11.5 - 15.5 %   Platelets 324 150 - 400 K/uL   nRBC 0.0 0.0 - 0.2 %    Comment: Performed at [facility], [place] [address], [place], [place] [address]  Comprehensive metabolic panel     Status: Abnormal   Collection Time: 04/25/19  6:00 PM  Result Value Ref Range   Sodium 139 135 - 145 mmol/L   Potassium 3.8 3.5 - 5.1 mmol/L   Chloride 107 98 - 111 mmol/L   CO2 25 22 - 32 mmol/L   Glucose, Bld 122 (H) 70 - 99 mg/dL    Comment: Glucose reference range applies only to samples taken after fasting for at least 8 hours.   BUN 15 6 - 20 mg/dL   Creatinine, Ser 0.80 0.44 - 1.00 mg/dL   Calcium 8.5 (L) 8.9 - 10.3 mg/dL   Total Protein 7.0 6.5 - 8.1 g/dL   Albumin 3.5 3.5 - 5.0 g/dL   AST 26 15 - 41 U/L   ALT 38 0 - 44 U/L   Alkaline Phosphatase 83 38 - 126 U/L   Total Bilirubin 0.4 0.3 - 1.2 mg/dL   GFR calc non Af Amer >60 >60 mL/min   GFR calc Af Amer >60 >60 mL/min   Anion gap 7 5 - 15    Comment: Performed at [facility], [place] [address] [place], [place] [address]  Ethanol     Status: None   Collection Time: 04/25/19  6:00 PM  Result Value Ref Range   Alcohol, Ethyl (B) <10 <10 mg/dL    Comment: (NOTE) Lowest detectable limit for serum alcohol is 10 mg/dL. For medical purposes only. Performed at [facility], [place] [address], [place], [place] [address]   Hemoglobin A1c     Status: Abnormal   Collection Time: 04/25/19  6:00 PM  Result Value Ref Range   Hgb A1c MFr Bld 5.7 (H) 4.8 - 5.6 %    Comment: (NOTE) Pre diabetes:          5.7%-6.4% Diabetes:              >6.4% Glycemic control for   <7.0% adults with diabetes    Mean Plasma Glucose 116.89 mg/dL    Comment: Performed at [place] [address], [place], [place] [address]  Lipid panel      Status: Abnormal   Collection Time: 04/25/19  6:00 PM  Result Value Ref Range   Cholesterol 286 (H) 0 - 200 mg/dL   Triglycerides 328 (H) <150 mg/dL   HDL 37 (L) >40 mg/dL   Total CHOL/HDL Ratio 7.7 RATIO   VLDL 66 (H) 0 - 40 mg/dL   LDL Cholesterol 183 (H) 0 - 99 mg/dL    Comment:        Total Cholesterol/HDL:CHD Risk Coronary Heart Disease Risk Table                     Men   Women  1/2 Average Risk   3.4   3.3  Average Risk       5.0   4.4  2 X Average Risk   9.6   7.1  3 X Average Risk  23.4   11.0        Use the calculated Patient Ratio above and the CHD Risk Table to determine the patient's CHD Risk.        ATP III CLASSIFICATION (LDL):  <100  mg/dL   Optimal  818-563  mg/dL   Near or Above                    Optimal  130-159  mg/dL   Borderline  149-702  mg/dL   High  >637     mg/dL   Very High Performed at [facility], [address] [place], [place] [address]   Prolactin     Status: Abnormal   Collection Time: 04/25/19  6:00 PM  Result Value Ref Range   Prolactin 102.0 (H) 4.8 - 23.3 ng/mL    Comment: (NOTE) Performed At: [facility] [address] [place], [place] [id number] Jolene Schimke MD OM:[id number]   TSH     Status: None   Collection Time: 04/25/19  6:00 PM  Result Value Ref Range   TSH 2.536 0.350 - 4.500 uIU/mL    Comment: Performed by a 3rd Generation assay with a functional sensitivity of <=0.01 uIU/mL. Performed at [facility], [address] [place], [place] [address]     Blood Alcohol level:  Lab Results  Component Value Date   ETH <10 04/25/2019   ETH <10 07/15/2018    Metabolic Disorder Labs: Lab Results  Component Value Date   HGBA1C 5.7 (H) 04/25/2019   MPG 116.89 04/25/2019   Lab Results  Component Value Date   PROLACTIN 102.0 (H) 04/25/2019   Lab Results  Component Value Date   CHOL 286 (H) 04/25/2019   TRIG 328 (H) 04/25/2019   HDL 37 (L) 04/25/2019   CHOLHDL  7.7 04/25/2019   VLDL 66 (H) 04/25/2019   LDLCALC 183 (H) 04/25/2019   LDLCALC 127 (H) 07/09/2018    Physical Findings: AIMS:  , ,  ,  ,    CIWA:  CIWA-Ar Total: 8 COWS:     Musculoskeletal: Strength & Muscle Tone: within normal limits Gait & Station: normal Patient leans: N/A  Psychiatric Specialty Exam: Physical Exam  Nursing note and vitals reviewed. Constitutional: She is oriented to person, place, and time. She appears well-developed and well-nourished.  Cardiovascular: Normal rate.  Respiratory: Effort normal.  Neurological: She is alert and oriented to person, place, and time.    Review of Systems  Constitutional: Negative.   Respiratory: Negative for cough and shortness of breath.   Psychiatric/Behavioral: Positive for agitation, behavioral problems and sleep disturbance. Negative for confusion, dysphoric mood, self-injury and suicidal ideas. The patient is not nervous/anxious and is not hyperactive.     Blood pressure (!) 169/105, pulse 79, temperature 98 F (36.7 C), temperature source Oral, resp. rate 18, height 5\' 6"  (1.676 m), weight 87.1 kg, SpO2 98 %.Body mass index is 30.99 kg/m.  General Appearance: Casual  Eye Contact:  Good  Speech:  Normal Rate  Volume:  Normal  Mood:  Euthymic  Affect:  Constricted  Thought Process:  Coherent  Orientation:  Full (Time, Place, and Person)  Thought Content:  Logical  Suicidal Thoughts:  No  Homicidal Thoughts:  No  Memory:  Immediate;   Fair Recent;   Fair  Judgement:  Impaired  Insight:  Lacking  Psychomotor Activity:  Normal  Concentration:  Concentration: Fair and Attention Span: Fair  Recall:  of Knowledge:  Fair  Language:  Good  Akathisia:  No  Handed:  Right  AIMS (if indicated):     Assets:  Communication Skills Housing Social Support  ADL's:  Intact  Cognition:  WNL  Sleep:  Number of Hours: 2     Treatment Plan Summary: Daily contact with patient to assess and evaluate symptoms  and progress in treatment and Medication management   Continue inpatient hospitalization.  Increase Risperdal to 4 mg PO BID for psychosis Continue Cogentin 0.5 mg PO BID for EPS Continue Tegretol 100 mg PO TID for mood instability Continue Norvasc 10 mg PO daily for HTN Continue clonidine 0.2 mg PO BID for HTN Continue Lipitor 20 mg PO daily for HLD Continue Vistaril 25 mg PO TID PRN anxiety Continue Ativan 2 mg PO/IM Q6HR PRN agitation Continue Restoril 30 mg PO QHS for insomnia  Patient will participate in the therapeutic group milieu.  Discharge disposition in progress.   Aldean BakerJanet E Corretta Munce, NP 04/26/2019, 7:27 AM

## 2019-04-27 DIAGNOSIS — F312 Bipolar disorder, current episode manic severe with psychotic features: Secondary | ICD-10-CM | POA: Diagnosis not present

## 2019-04-27 MED ORDER — TRAZODONE HCL 100 MG PO TABS
100.0000 mg | ORAL_TABLET | Freq: Every evening | ORAL | Status: DC | PRN
  Administered 2019-04-27 – 2019-04-28 (×2): 100 mg via ORAL
  Filled 2019-04-27 (×7): qty 1

## 2019-04-27 NOTE — Progress Notes (Signed)
Patient visible on the unit interacts with staff. ambulatory with a steady gait. Denies SI, HI, AVH and pain at this time. Pleasant and cooperative on approach. Compliant with medications. Denies any issues or concerns this am.Pt remains on Q 15 minutes safety checks and without self harm gestures. Writer encouraged pt to voice concerns. Pt remains safe on unit.

## 2019-04-27 NOTE — Progress Notes (Signed)
Adult Psychoeducational Group Note  Date:  04/27/2019 Time:  11:34 PM  Group Topic/Focus:  Wrap-Up Group:   The focus of this group is to help patients review their daily goal of treatment and discuss progress on daily workbooks.  Participation Level:  Active  Participation Quality:  Appropriate  Affect:  Appropriate  Cognitive:  Appropriate  Insight: Appropriate  Engagement in Group:  Developing/Improving  Modes of Intervention:  Discussion  Additional Comments:  Pt stated her goal for today was to focus on her treatment plan. Pt stated she accomplished her goal today. Pt stated her relationship with her family has improved since she was admitted. Pt stated been able to contact her Mother and Father today improved her day. Pt rated her overall day a 8 out of 10. Pt stated her appetite was pretty good today. Pt stated her sleep last night was pretty good. Pt stated she was in no physical pain today.  Pt deny auditory or visual hallucinations. Pt denies thoughts of harming herself or others. Pt stated she would alert staff if anything changes.  Chelsea [name] 04/27/2019, 11:34 PM

## 2019-04-27 NOTE — Progress Notes (Signed)
[facility] MD Progress Note  04/27/2019 8:00 AM Sameena Artus  MRN:  [id number] Subjective:  "I'm fine."  Ms. Yamamoto seen sitting in the dayroom. Per nursing report she only slept 2 hours last night and was intrusive at times overnight. She still did not sleep well with 50 mg trazodone. Patient states she did not sleep overnight due to sleeping during the day yesterday. Continues to state, "It's normal for me to be up at this time." No agitated or disruptive behaviors. She has well-organized speech, appropriate affect, and shows no signs of responding to internal stimuli. She denies SI/HI/AVH. Denies medication side effects. Blood pressure has been elevated, with UDS positive for BZDs. Patient denies any use of BZDs prior to admission and denies withdrawal symptoms. She reports checking blood pressures at home and states they typically run 130s/90s at home.  From admission H&P: This is the latest of numerous admissions/encounters in her healthcare system and elsewhere for [name], who required petition by family members due to medication noncompliance, and hallucinations, poor hygiene and overall deterioration in her condition.  Principal Problem: Bipolar I disorder, current or most recent episode manic, with psychotic features (HCC) Diagnosis: Principal Problem:   Bipolar I disorder, current or most recent episode manic, with psychotic features (HCC) Active Problems:   Schizoaffective disorder (HCC)   Bipolar 1 disorder (HCC)  Total Time spent with patient: 15 minutes  Past Psychiatric History: See admission H&P  Past Medical History:  Past Medical History:  Diagnosis Date  . Allergy   . Bipolar disorder (HCC)   . Depression   . Eczema   . Elevated cholesterol   . Hypertension   . Schizophrenia [facility])     Past Surgical History:  Procedure Laterality Date  . NO PAST SURGERIES     Family History:  Family History  Problem Relation Age of Onset  . Breast cancer Mother   . Hypertension  Father   . Diabetes Paternal Grandmother   . Colon cancer Neg Hx   . CAD Neg Hx    Family Psychiatric  History: See admission H&P Social History:  Social History   Substance and Sexual Activity  Alcohol Use Yes   Comment:  socially , rare      Social History   Substance and Sexual Activity  Drug Use No    Social History   Socioeconomic History  . Marital status: Divorced    Spouse name: Not on file  . Number of children: Not on file  . Years of education: Not on file  . Highest education level: Not on file  Occupational History  . Occupation: disability   Tobacco Use  . Smoking status: Current Every Day Smoker    Packs/day: 1.50    Types: Cigarettes  . Smokeless tobacco: Never Used  Substance and Sexual Activity  . Alcohol use: Yes    Comment:  socially , rare   . Drug use: No  . Sexual activity: Not Currently  Other Topics Concern  . Not on file  Social History Narrative   [name]   Lives w/ parents    Father Metha Kolasa    Social Determinants of Health   Financial Resource Strain:   . Difficulty of Paying Living Expenses:   Food Insecurity:   . Worried About Programme researcher, broadcasting/film/video in the Last Year:   . Barista in the Last Year:   Transportation Needs:   . Freight forwarder (Medical):   Marland Kitchen Lack of Transportation (  Non-Medical):   Physical Activity:   . Days of Exercise per Week:   . Minutes of Exercise per Session:   Stress:   . Feeling of Stress :   Social Connections:   . Frequency of Communication with Friends and Family:   . Frequency of Social Gatherings with Friends and Family:   . Attends Religious Services:   . Active Member of Clubs or Organizations:   . Attends Archivist Meetings:   Marland Kitchen Marital Status:    Additional Social History:    Pain Medications: See MAR Prescriptions: See MAR Over the Counter: See MAR History of alcohol / drug use?: No history of alcohol / drug abuse Longest period of sobriety (when/how long):  NA                    Sleep: Poor  Appetite:  Good  Current Medications: Current Facility-Administered Medications  Medication Dose Route Frequency Provider Last Rate Last Admin  . acetaminophen (TYLENOL) tablet 650 mg  650 mg Oral Q6H PRN Emmaline Kluver, FNP   650 mg at 04/25/19 2349  . alum & mag hydroxide-simeth (MAALOX/MYLANTA) 200-200-20 MG/5ML suspension 30 mL  30 mL Oral Q4H PRN Emmaline Kluver, FNP      . amLODipine (NORVASC) tablet 10 mg  10 mg Oral Daily Anike, Adaku C, NP   10 mg at 04/27/19 0546  . atorvastatin (LIPITOR) tablet 20 mg  20 mg Oral QHS Mordecai Maes, NP   20 mg at 04/26/19 2240  . benztropine (COGENTIN) tablet 0.5 mg  0.5 mg Oral BID Johnn Hai, MD   0.5 mg at 04/27/19 0740  . carbamazepine (TEGRETOL) chewable tablet 100 mg  100 mg Oral TID Johnn Hai, MD   100 mg at 04/27/19 0740  . cloNIDine (CATAPRES) tablet 0.2 mg  0.2 mg Oral BID Mordecai Maes, NP   0.2 mg at 04/27/19 0545  . hydrOXYzine (ATARAX/VISTARIL) tablet 25 mg  25 mg Oral TID PRN Emmaline Kluver, FNP   25 mg at 04/25/19 0116  . LORazepam (ATIVAN) tablet 2 mg  2 mg Oral Q6H PRN Johnn Hai, MD       Or  . LORazepam (ATIVAN) injection 2 mg  2 mg Intramuscular Q6H PRN Johnn Hai, MD      . magnesium hydroxide (MILK OF MAGNESIA) suspension 30 mL  30 mL Oral Daily PRN Emmaline Kluver, FNP      . nicotine polacrilex (NICORETTE) gum 2 mg  2 mg Oral PRN Johnn Hai, MD   2 mg at 04/27/19 0704  . risperiDONE (RISPERDAL) tablet 4 mg  4 mg Oral BID Connye Burkitt, NP   4 mg at 04/27/19 0740  . temazepam (RESTORIL) capsule 30 mg  30 mg Oral QHS Johnn Hai, MD   30 mg at 04/26/19 2240  . traZODone (DESYREL) tablet 50 mg  50 mg Oral QHS PRN Emmaline Kluver, FNP   50 mg at 04/27/19 0157    Lab Results:  Results for orders placed or performed during the hospital encounter of 04/23/19 (from the past 48 hour(s))  CBC     Status: None   Collection Time: 04/25/19  6:00 PM  Result Value Ref Range   WBC 7.4  4.0 - 10.5 K/uL   RBC 4.43 3.87 - 5.11 MIL/uL   Hemoglobin 13.3 12.0 - 15.0 g/dL   HCT 41.3 36.0 - 46.0 %   MCV 93.2 80.0 - 100.0 fL   MCH  30.0 26.0 - 34.0 pg   MCHC 32.2 30.0 - 36.0 g/dL   RDW 91.4 78.2 - 95.6 %   Platelets 324 150 - 400 K/uL   nRBC 0.0 0.0 - 0.2 %    Comment: Performed at [facility], [address], [place], [place] [address]  Comprehensive metabolic panel     Status: Abnormal   Collection Time: 04/25/19  6:00 PM  Result Value Ref Range   Sodium 139 135 - 145 mmol/L   Potassium 3.8 3.5 - 5.1 mmol/L   Chloride 107 98 - 111 mmol/L   CO2 25 22 - 32 mmol/L   Glucose, Bld 122 (H) 70 - 99 mg/dL    Comment: Glucose reference range applies only to samples taken after fasting for at least 8 hours.   BUN 15 6 - 20 mg/dL   Creatinine, Ser 6.57 0.44 - 1.00 mg/dL   Calcium 8.5 (L) 8.9 - 10.3 mg/dL   Total Protein 7.0 6.5 - 8.1 g/dL   Albumin 3.5 3.5 - 5.0 g/dL   AST 26 15 - 41 U/L   ALT 38 0 - 44 U/L   Alkaline Phosphatase 83 38 - 126 U/L   Total Bilirubin 0.4 0.3 - 1.2 mg/dL   GFR calc non Af Amer >60 >60 mL/min   GFR calc Af Amer >60 >60 mL/min   Anion gap 7 5 - 15    Comment: Performed at [facility], [address], [place], [place] [address]  Ethanol     Status: None   Collection Time: 04/25/19  6:00 PM  Result Value Ref Range   Alcohol, Ethyl (B) <10 <10 mg/dL    Comment: (NOTE) Lowest detectable limit for serum alcohol is 10 mg/dL. For medical purposes only. Performed at [facility], [address], [place], [place] [address]   Hemoglobin A1c     Status: Abnormal   Collection Time: 04/25/19  6:00 PM  Result Value Ref Range   Hgb A1c MFr Bld 5.7 (H) 4.8 - 5.6 %    Comment: (NOTE) Pre diabetes:          5.7%-6.4% Diabetes:              >6.4% Glycemic control for   <7.0% adults with diabetes    Mean Plasma Glucose 116.89 mg/dL    Comment: Performed at [facility], [address],  [place], [place] [address]  Lipid panel     Status: Abnormal   Collection Time: 04/25/19  6:00 PM  Result Value Ref Range   Cholesterol 286 (H) 0 - 200 mg/dL   Triglycerides 401 (H) <150 mg/dL   HDL 37 (L) >02 mg/dL   Total CHOL/HDL Ratio 7.7 RATIO   VLDL 66 (H) 0 - 40 mg/dL   LDL Cholesterol 725 (H) 0 - 99 mg/dL    Comment:        Total Cholesterol/HDL:CHD Risk Coronary Heart Disease Risk Table                     Men   Women  1/2 Average Risk   3.4   3.3  Average Risk       5.0   4.4  2 X Average Risk   9.6   7.1  3 X Average Risk  23.4   11.0        Use the calculated Patient Ratio above and the CHD Risk Table to determine the  patient's CHD Risk.        ATP III CLASSIFICATION (LDL):  <100     mg/dL   Optimal  751-025  mg/dL   Near or Above                    Optimal  130-159  mg/dL   Borderline  852-778  mg/dL   High  >242     mg/dL   Very High Performed at [facility], [address] [place], [place] [address]   Prolactin     Status: Abnormal   Collection Time: 04/25/19  6:00 PM  Result Value Ref Range   Prolactin 102.0 (H) 4.8 - 23.3 ng/mL    Comment: (NOTE) Performed At: [facility] [address] [place], [place] [id number] Jolene Schimke MD QP:[id number]   TSH     Status: None   Collection Time: 04/25/19  6:00 PM  Result Value Ref Range   TSH 2.536 0.350 - 4.500 uIU/mL    Comment: Performed by a 3rd Generation assay with a functional sensitivity of <=0.01 uIU/mL. Performed at [facility], [address] [place], [place] [address]     Blood Alcohol level:  Lab Results  Component Value Date   [facility] <10 04/25/2019   ETH <10 07/15/2018    Metabolic Disorder Labs: Lab Results  Component Value Date   HGBA1C 5.7 (H) 04/25/2019   MPG 116.89 04/25/2019   Lab Results  Component Value Date   PROLACTIN 102.0 (H) 04/25/2019   Lab Results  Component Value Date   CHOL 286 (H) 04/25/2019   TRIG 328 (H) 04/25/2019    HDL 37 (L) 04/25/2019   CHOLHDL 7.7 04/25/2019   VLDL 66 (H) 04/25/2019   LDLCALC 183 (H) 04/25/2019   LDLCALC 127 (H) 07/09/2018    Physical Findings: AIMS: Facial and Oral Movements Muscles of Facial Expression: None, normal Lips and Perioral Area: None, normal Jaw: None, normal Tongue: None, normal,Extremity Movements Upper (arms, wrists, hands, fingers): None, normal Lower (legs, knees, ankles, toes): None, normal, Trunk Movements Neck, shoulders, hips: None, normal, Overall Severity Severity of abnormal movements (highest score from questions above): None, normal Incapacitation due to abnormal movements: None, normal Patient's awareness of abnormal movements (rate only patient's report): No Awareness, Dental Status Current problems with teeth and/or dentures?: No Does patient usually wear dentures?: No  CIWA:  CIWA-Ar Total: 8 COWS:     Musculoskeletal: Strength & Muscle Tone: within normal limits Gait & Station: normal Patient leans: N/A  Psychiatric Specialty Exam: Physical Exam  Nursing note and vitals reviewed. Constitutional: She is oriented to person, place, and time. She appears well-developed and well-nourished.  Cardiovascular: Normal rate.  Respiratory: Effort normal.  Neurological: She is alert and oriented to person, place, and time.    Review of Systems  Constitutional: Negative.   Respiratory: Negative for cough and shortness of breath.   Gastrointestinal: Negative for diarrhea, nausea and vomiting.  Neurological: Negative for tremors and headaches.  Psychiatric/Behavioral: Positive for sleep disturbance. Negative for agitation, behavioral problems, confusion, dysphoric mood, hallucinations, self-injury and suicidal ideas. The patient is not nervous/anxious and is not hyperactive.     Blood pressure (!) 135/109, pulse 97, temperature 98.2 F (36.8 C), temperature source Oral, resp. rate 18, height 5\' 6"  (1.676 m), weight 87.1 kg, SpO2 98 %.Body mass  index is 30.99 kg/m.  General Appearance: Casual  Eye Contact:  Good  Speech:  Normal Rate  Volume:  Normal  Mood:  Euthymic  Affect:  Congruent  Thought Process:  Coherent  Orientation:  Full (Time, Place, and Person)  Thought Content:  Logical  Suicidal Thoughts:  No  Homicidal Thoughts:  No  Memory:  Immediate;   Fair Recent;   Fair  Judgement:  Intact  Insight:  Fair  Psychomotor Activity:  Normal  Concentration:  Concentration: Fair and Attention Span: Fair  Recall:  [employer]Fair  Fund of Knowledge:  Fair  Language:  Good  Akathisia:  No  Handed:  Right  AIMS (if indicated):     Assets:  Communication Skills Desire for Improvement Housing Social Support  ADL's:  Intact  Cognition:  WNL  Sleep:  Number of Hours: 2.75     Treatment Plan Summary: Daily contact with patient to assess and evaluate symptoms and progress in treatment and Medication management   Continue inpatient hospitalization.  Increase trazodone to 100 mg PO QHS for insomnia, may repeat x1 PRN Continue Risperdal 4 mg PO BID for psychosis Continue Cogentin 0.5 mg PO BID for EPS Continue Tegretol 100 mg PO TID for mood instability Continue Norvasc 10 mg PO daily for HTN Continue clonidine 0.2 mg PO BID for HTN Continue Lipitor 20 mg PO daily for HLD Continue Vistaril 25 mg PO TID PRN anxiety Continue Ativan 2 mg PO/IM Q6HR PRN agitation Continue Restoril 30 mg PO QHS for insomnia  Patient will participate in the therapeutic group milieu.  Discharge disposition in progress.   Aldean BakerJanet E Treyon Wymore, NP 04/27/2019, 8:00 AM

## 2019-04-27 NOTE — Progress Notes (Signed)
Pt denies SI/HI/AVH

## 2019-04-28 MED ORDER — TEMAZEPAM 15 MG PO CAPS
45.0000 mg | ORAL_CAPSULE | Freq: Every day | ORAL | Status: DC
  Administered 2019-04-28: 45 mg via ORAL
  Filled 2019-04-28: qty 3

## 2019-04-28 MED ORDER — ARIPIPRAZOLE ER 400 MG IM SRER
400.0000 mg | INTRAMUSCULAR | Status: DC
  Administered 2019-04-28: 400 mg via INTRAMUSCULAR

## 2019-04-28 NOTE — Progress Notes (Signed)
[facility] MD Progress Note  04/28/2019 1:16 PM Chelsea [name]  MRN:  [id number] Subjective:    Patient continues to generally deny illness believes everything is baseline, sleep she reports is improved she denies racing thoughts believes family has overreacted.  She does acknowledge possible lack of success with medication transition has tolerated aripiprazole in the past found it helpful discussed long-acting injectable to ensure compliance. Principal Problem: Bipolar I disorder, current or most recent episode manic, with psychotic features (HCC) Diagnosis: Principal Problem:   Bipolar I disorder, current or most recent episode manic, with psychotic features (HCC) Active Problems:   Schizoaffective disorder (HCC)   Bipolar 1 disorder (HCC)  Total Time spent with patient: 20 minutes  Past Psychiatric History: No new data  Past Medical History:  Past Medical History:  Diagnosis Date  . Allergy   . Bipolar disorder (HCC)   . Depression   . Eczema   . Elevated cholesterol   . Hypertension   . Schizophrenia [facility])     Past Surgical History:  Procedure Laterality Date  . NO PAST SURGERIES     Family History:  Family History  Problem Relation Age of Onset  . Breast cancer Mother   . Hypertension Father   . Diabetes Paternal Grandmother   . Colon cancer Neg Hx   . CAD Neg Hx    Family Psychiatric  History: No new data Social History:  Social History   Substance and Sexual Activity  Alcohol Use Yes   Comment:  socially , rare      Social History   Substance and Sexual Activity  Drug Use No    Social History   Socioeconomic History  . Marital status: Divorced    Spouse name: Not on file  . Number of children: Not on file  . Years of education: Not on file  . Highest education level: Not on file  Occupational History  . Occupation: disability   Tobacco Use  . Smoking status: Current Every Day Smoker    Packs/day: 1.50    Types: Cigarettes  . Smokeless tobacco: Never  Used  Substance and Sexual Activity  . Alcohol use: Yes    Comment:  socially , rare   . Drug use: No  . Sexual activity: Not Currently  Other Topics Concern  . Not on file  Social History Narrative   [name]   Lives w/ parents    Father Laruth Hanger    Social Determinants of Health   Financial Resource Strain:   . Difficulty of Paying Living Expenses:   Food Insecurity:   . Worried About Programme researcher, broadcasting/film/video in the Last Year:   . Barista in the Last Year:   Transportation Needs:   . Freight forwarder (Medical):   Marland Kitchen Lack of Transportation (Non-Medical):   Physical Activity:   . Days of Exercise per Week:   . Minutes of Exercise per Session:   Stress:   . Feeling of Stress :   Social Connections:   . Frequency of Communication with Friends and Family:   . Frequency of Social Gatherings with Friends and Family:   . Attends Religious Services:   . Active Member of Clubs or Organizations:   . Attends Banker Meetings:   Marland Kitchen Marital Status:    Additional Social History:    Pain Medications: See MAR Prescriptions: See MAR Over the Counter: See MAR History of alcohol / drug use?: No history of alcohol / drug  abuse Longest period of sobriety (when/how long): NA                    Sleep: Fair  Appetite:  Fair  Current Medications: Current Facility-Administered Medications  Medication Dose Route Frequency Provider Last Rate Last Admin  . acetaminophen (TYLENOL) tablet 650 mg  650 mg Oral Q6H PRN Emmaline Kluver, FNP   650 mg at 04/25/19 2349  . alum & mag hydroxide-simeth (MAALOX/MYLANTA) 200-200-20 MG/5ML suspension 30 mL  30 mL Oral Q4H PRN Emmaline Kluver, FNP      . amLODipine (NORVASC) tablet 10 mg  10 mg Oral Daily Anike, Adaku C, NP   10 mg at 04/28/19 0732  . ARIPiprazole ER (ABILIFY MAINTENA) injection 400 mg  400 mg Intramuscular Q28 days Johnn Hai, MD      . atorvastatin (LIPITOR) tablet 20 mg  20 mg Oral QHS Mordecai Maes, NP   20  mg at 04/27/19 2053  . benztropine (COGENTIN) tablet 0.5 mg  0.5 mg Oral BID Johnn Hai, MD   0.5 mg at 04/28/19 0732  . carbamazepine (TEGRETOL) chewable tablet 100 mg  100 mg Oral TID Johnn Hai, MD   100 mg at 04/28/19 1147  . cloNIDine (CATAPRES) tablet 0.2 mg  0.2 mg Oral BID Mordecai Maes, NP   0.2 mg at 04/28/19 0732  . hydrOXYzine (ATARAX/VISTARIL) tablet 25 mg  25 mg Oral TID PRN Emmaline Kluver, FNP   25 mg at 04/28/19 0154  . LORazepam (ATIVAN) tablet 2 mg  2 mg Oral Q6H PRN Johnn Hai, MD       Or  . LORazepam (ATIVAN) injection 2 mg  2 mg Intramuscular Q6H PRN Johnn Hai, MD      . magnesium hydroxide (MILK OF MAGNESIA) suspension 30 mL  30 mL Oral Daily PRN Emmaline Kluver, FNP      . nicotine polacrilex (NICORETTE) gum 2 mg  2 mg Oral PRN Johnn Hai, MD   2 mg at 04/28/19 1212  . risperiDONE (RISPERDAL) tablet 4 mg  4 mg Oral BID Connye Burkitt, NP   4 mg at 04/28/19 0732  . temazepam (RESTORIL) capsule 30 mg  30 mg Oral QHS Johnn Hai, MD   30 mg at 04/27/19 2052  . traZODone (DESYREL) tablet 100 mg  100 mg Oral QHS,MR X 1 Connye Burkitt, NP   100 mg at 04/27/19 2053    Lab Results: No results found for this or any previous visit (from the past 48 hour(s)).  Blood Alcohol level:  Lab Results  Component Value Date   ETH <10 04/25/2019   ETH <10 40/98/1191    Metabolic Disorder Labs: Lab Results  Component Value Date   HGBA1C 5.7 (H) 04/25/2019   MPG 116.89 04/25/2019   Lab Results  Component Value Date   PROLACTIN 102.0 (H) 04/25/2019   Lab Results  Component Value Date   CHOL 286 (H) 04/25/2019   TRIG 328 (H) 04/25/2019   HDL 37 (L) 04/25/2019   CHOLHDL 7.7 04/25/2019   VLDL 66 (H) 04/25/2019   LDLCALC 183 (H) 04/25/2019   LDLCALC 127 (H) 07/09/2018    Physical Findings: AIMS: Facial and Oral Movements Muscles of Facial Expression: None, normal Lips and Perioral Area: None, normal Jaw: None, normal Tongue: None, normal,Extremity  Movements Upper (arms, wrists, hands, fingers): None, normal Lower (legs, knees, ankles, toes): None, normal, Trunk Movements Neck, shoulders, hips: None, normal, Overall Severity Severity of abnormal  movements (highest score from questions above): None, normal Incapacitation due to abnormal movements: None, normal Patient's awareness of abnormal movements (rate only patient's report): No Awareness, Dental Status Current problems with teeth and/or dentures?: No Does patient usually wear dentures?: No  CIWA:  CIWA-Ar Total: 8 COWS:     Musculoskeletal: Strength & Muscle Tone: within normal limits Gait & Station: normal Patient leans: N/A  Psychiatric Specialty Exam: Physical Exam  Review of Systems  Blood pressure (!) 158/112, pulse (!) 114, temperature 97.8 F (36.6 C), temperature source Oral, resp. rate 18, height 5\' 6"  (1.676 m), weight 87.1 kg, SpO2 92 %.Body mass index is 30.99 kg/m.  General Appearance: Casual  Eye Contact:  Good  Speech:  Clear and Coherent  Volume:  Normal  Mood:  Euthymic  Affect:  Congruent  Thought Process:  Linear  Orientation:  Full (Time, Place, and Person)  Thought Content:  Logical  Suicidal Thoughts:  No  Homicidal Thoughts:  No  Memory:  Immediate;   Fair Recent;   Fair Remote;   Fair  Judgement:  Fair  Insight:  Shallow  Psychomotor Activity:  Normal  Concentration:  Concentration: Fair and Attention Span: Fair  Recall:  of Knowledge:  Fair  Language:  Fair  Akathisia:  Negative  Handed:  Right  AIMS (if indicated):     Assets:  Communication Skills Physical Health Resilience  ADL's:  Intact  Cognition:  WNL  Sleep:  Number of Hours: 3     Treatment Plan Summary: Daily contact with patient to assess and evaluate symptoms and progress in treatment and Medication management patient's perception of her sleep is off we will escalate Sleep Aid we will administer long-acting injectable Abilify probable discharge  tomorrow  [employer], MD 04/28/2019, 1:16 PM

## 2019-04-28 NOTE — Plan of Care (Signed)
Progress note  Pt found in the hallway; compliant with medication administration. Pt denies any physical complaints or pain besides continual withdrawal symptoms from nicotine. Pt is inquisitive about medications and discharge planning. Pt continues to pace and is fidgety on approach. Pt is pleasant. Pt denies si/hi/ah/vh and verbally agrees to approach staff if these become apparent or before harming themself/others while at [facility].  A: Pt provided support and encouragement. Pt given medication per protocol and standing orders. Q71m safety checks implemented and continued.  R: Pt safe on the unit. Will continue to monitor.  Pt progressing in the following metrics  Problem: Role Relationship: Goal: Ability to communicate needs accurately will improve Outcome: Progressing   Problem: Safety: Goal: Ability to redirect hostility and anger into socially appropriate behaviors will improve Outcome: Progressing Goal: Ability to remain free from injury will improve Outcome: Progressing   Problem: Self-Care: Goal: Ability to participate in self-care as condition permits will improve Outcome: Progressing

## 2019-04-28 NOTE — Progress Notes (Signed)
Recreation Therapy Notes  Date: 4.19.21 Time: 1000 Location: 500 Hall Dayroom  Group Topic: Coping Skills  Goal Area(s) Addresses:  Patients will identify positive coping skills. Patients will identify benefit of using coping skills post d/c.  Intervention: Worksheet, pencils  Activity: Web Design.  Patients were to identify situations, feelings, etc they feel have been holding them back and write those inside the web.  Patients then identified and wrote positive coping skills that could be used to combat these feelings and situations on the outside of the web.  Education: Coping Skills, Discharge Planning.   Education Outcome: Acknowledges understanding/In group clarification offered/Needs additional education.   Clinical Observations/Feedback: Pt did not attend group.     [name], LRT/CTRS         [name] A 04/28/2019 11:51 AM 

## 2019-04-28 NOTE — Progress Notes (Signed)
   04/28/19 2000  Psych Admission Type (Psych Patients Only)  Admission Status Involuntary  Psychosocial Assessment  Patient Complaints Anxiety  Eye Contact Fair  Facial Expression Anxious;Pensive;Worried  Affect Anxious;Appropriate to circumstance;Preoccupied  Speech Logical/coherent  Interaction Assertive  Motor Activity Fidgety;Pacing;Restless  Appearance/Hygiene Unremarkable  Behavior Characteristics Cooperative  Mood Anxious  Thought Process  Coherency WDL  Content WDL  Delusions None reported or observed  Perception WDL  Hallucination None reported or observed  Judgment WDL  Confusion None  Danger to Self  Current suicidal ideation? Denies  Danger to Others  Danger to Others None reported or observed

## 2019-04-29 MED ORDER — CARBAMAZEPINE ER 200 MG PO TB12
200.0000 mg | ORAL_TABLET | Freq: Two times a day (BID) | ORAL | Status: DC
  Filled 2019-04-29 (×3): qty 14

## 2019-04-29 MED ORDER — BENZTROPINE MESYLATE 1 MG PO TABS: 1.0000 mg | ORAL_TABLET | Freq: Every day | ORAL | 2 refills | Status: AC

## 2019-04-29 MED ORDER — BENZTROPINE MESYLATE 1 MG PO TABS
1.0000 mg | ORAL_TABLET | Freq: Every day | ORAL | Status: DC
  Filled 2019-04-29 (×2): qty 7

## 2019-04-29 MED ORDER — ARIPIPRAZOLE ER 400 MG IM SRER: 400.0000 mg | INTRAMUSCULAR | 11 refills | Status: DC

## 2019-04-29 MED ORDER — PROPRANOLOL HCL ER 120 MG PO CP24: 120.0000 mg | ORAL_CAPSULE | Freq: Every day | ORAL | 1 refills | Status: DC

## 2019-04-29 MED ORDER — PROPRANOLOL HCL ER 60 MG PO CP24
120.0000 mg | ORAL_CAPSULE | Freq: Every day | ORAL | Status: DC
  Administered 2019-04-29: 120 mg via ORAL
  Filled 2019-04-29 (×2): qty 1

## 2019-04-29 MED ORDER — CARBAMAZEPINE ER 200 MG PO TB12: 200.0000 mg | ORAL_TABLET | Freq: Two times a day (BID) | ORAL | 2 refills | Status: AC

## 2019-04-29 MED ORDER — RISPERIDONE 4 MG PO TABS: 4.0000 mg | ORAL_TABLET | Freq: Two times a day (BID) | ORAL | 2 refills | Status: DC

## 2019-04-29 MED ORDER — CLONIDINE HCL 0.2 MG PO TABS: 0.2000 mg | ORAL_TABLET | Freq: Two times a day (BID) | ORAL | 2 refills | Status: DC

## 2019-04-29 MED ORDER — ZOLPIDEM TARTRATE 10 MG PO TABS: 10.0000 mg | ORAL_TABLET | Freq: Every evening | ORAL | 0 refills | Status: DC | PRN

## 2019-04-29 NOTE — Progress Notes (Signed)
Discharge Note:  Patient denies SI/HI AVH at this time. Discharge instructions, AVS, prescriptions and transition record gone over with patient. Prescription samples given. Patient agrees to comply with medication management, follow-up visit, and outpatient therapy. Patient belongings returned to patient. Patient questions and concerns addressed and answered.  Patient ambulatory off unit.  Cab called for pt. Safety maintained

## 2019-04-29 NOTE — Progress Notes (Signed)
Pt up to the nursing station with numerous complaints. Pt stated she was having issues after she got the shot , pt then stated she don't sleep at night, pt continues to think of various things to say , just to be noticed. Pt stated earlier she didn't want to be in the room because at least people were out here.

## 2019-04-29 NOTE — Progress Notes (Signed)
Pt had elevated Bp pt given Bp medication early

## 2019-04-29 NOTE — Discharge Summary (Signed)
Physician Discharge Summary Note  Patient:  Chelsea [name] is an 49 y.o., female MRN:  [id number] DOB:  08/23/1970 Patient phone:  [phone] (home)  Patient address:   [address] [place] [address],  Total Time spent with patient: 45 minutes  Date of Admission:  04/23/2019 Date of Discharge: 04/29/2019  Reason for Admission:  This is the latest of numerous admissions/encounters w/ our  healthcare system and elsewhere for [name], who required petition by family members due to medication noncompliance, and hallucinations, poor hygiene and overall deterioration in her condition.  The patient denies all issues on the petition stated she had been transitioned from [facility] to [place] and was taking the medications.  She blames family for overreacting and committing her without justification.  Patient elaborates "it is always the same thing when they commit me"  Past medication trials have included Latuda, cariprazine, Abilify, lamotrigine, Depakote, Adderall, Zyprexa.  Ambien and trazodone as needed as well as temazepam as needed  Patient will probably need long-acting injectable  According to assessment counselor note of yesterday she is also: [place] physician  Monnica Saltsman an 49 y.o.femalewho was brought to [facility] by the police on IVC, petitioned by her family: Per IVC:  Patient has been diagnosed with Schizoaffective Disorder Bipolar Type and is no-compliant with taking her medication. Patient is not sleeping or attending to her personal hygiene. She has a history of multiple mental health commitments. Patient is talking to walls, screaming and having verbal altercations with people who are not there. She has been aggressive with family members. She has been distracted and leaving the oven/stove on. She takes the shared family car for hours at a time and does not let anyone know where she is. She has gotten lost while driving and returning hours later. She has  been leaving the house at 3 am.  Patient denies everything on the commitment papers and states that her family is lying. She states that her medications were recently changed. She states that she was last in the hospital at [facility] Last year and states that she is currently being seen on outpatient basis at the mood treatment center. Patient states that she has been sleeping and eating well. She denies SI/HI/Psychosis and states that she has no SA issues. She denies any history of abuse or self-mutilation.  TTS spoke to petitioner, Randall An [phone], who reports that patient was fine last week, but he states that she has not been taking her medication and her behavior is getting out of control. He states that she has been aggressive towards family, has been making statements about killing herself or others. He states that she has been carrying on conversations with herself and he states that she argues and fights with people who are not there. He said, "her behavior is getting so bad that it cannot be ignored. He states that she is not bathing or eating and he states that she has been chain smoking. He states that she has spent all of her money on intranet scams because she cannot identify what is real and what is not. He states that when she takes her medication that she is a normal person.  Principal Problem: Bipolar I disorder, current or most recent episode manic, with psychotic features [facility]) Discharge Diagnoses: Principal Problem:   Bipolar I disorder, current or most recent episode manic, with psychotic features (HCC) Active Problems:   Schizoaffective disorder (HCC)   Bipolar 1 disorder (HCC)   Past Psychiatric History: see abive  Past Medical History:  Past Medical History:  Diagnosis Date  . Allergy   . Bipolar disorder ([place])   . Depression   . Eczema   . Elevated cholesterol   . Hypertension   . Schizophrenia [facility])     Past Surgical History:  Procedure  Laterality Date  . NO PAST SURGERIES     Family History:  Family History  Problem Relation Age of Onset  . Breast cancer Mother   . Hypertension Father   . Diabetes Paternal Grandmother   . Colon cancer Neg Hx   . CAD Neg Hx    Family Psychiatric  History: see above Social History:  Social History   Substance and Sexual Activity  Alcohol Use Yes   Comment:  socially , rare      Social History   Substance and Sexual Activity  Drug Use No    Social History   Socioeconomic History  . Marital status: Divorced    Spouse name: Not on file  . Number of children: Not on file  . Years of education: Not on file  . Highest education level: Not on file  Occupational History  . Occupation: disability   Tobacco Use  . Smoking status: Current Every Day Smoker    Packs/day: 1.50    Types: Cigarettes  . Smokeless tobacco: Never Used  Substance and Sexual Activity  . Alcohol use: Yes    Comment:  socially , rare   . Drug use: No  . Sexual activity: Not Currently  Other Topics Concern  . Not on file  Social History Narrative   [name]   Lives w/ parents    Father Jamariah Tony    Social Determinants of Health   Financial Resource Strain:   . Difficulty of Paying Living Expenses:   Food Insecurity:   . Worried About Charity fundraiser in the Last Year:   . Arboriculturist in the Last Year:   Transportation Needs:   . Film/video editor (Medical):   Marland Kitchen Lack of Transportation (Non-Medical):   Physical Activity:   . Days of Exercise per Week:   . Minutes of Exercise per Session:   Stress:   . Feeling of Stress :   Social Connections:   . Frequency of Communication with Friends and Family:   . Frequency of Social Gatherings with Friends and Family:   . Attends Religious Services:   . Active Member of Clubs or Organizations:   . Attends Archivist Meetings:   Marland Kitchen Marital Status:     Hospital Course:    Patient was admitted under routine precautions and  never required a higher level of monitoring.  She consistently denied symptoms, requested discharge and minimized the presentation.  She had persistent insomnia, hypertension despite compliance with Norvasc and Catapres, and med adjustments included long-acting injectable, replacing Norvasc with Inderal at the point of discharge, and the combination of mood stabilizer/Tegretol and Risperdal in addition to long-acting injectable.   Patient still resists medication even at the point of discharge asking if she really needs to take oral medications in addition to long-acting injectable, we did reassure her there is a definite overlap and she is improved on both and should continue current medications.  But at the point of discharge no acute mania no thoughts of harming self or others although guarded cooperative   Physical Findings: AIMS: Facial and Oral Movements Muscles of Facial Expression: None, normal Lips and Perioral Area: None, normal Jaw:  None, normal Tongue: None, normal,Extremity Movements Upper (arms, wrists, hands, fingers): None, normal Lower (legs, knees, ankles, toes): None, normal, Trunk Movements Neck, shoulders, hips: None, normal, Overall Severity Severity of abnormal movements (highest score from questions above): None, normal Incapacitation due to abnormal movements: None, normal Patient's awareness of abnormal movements (rate only patient's report): No Awareness, Dental Status Current problems with teeth and/or dentures?: No Does patient usually wear dentures?: No  CIWA:  CIWA-Ar Total: 8 COWS:     Musculoskeletal: Strength & Muscle Tone: within normal limits Gait & Station: normal Patient leans: N/A  Psychiatric Specialty Exam: Physical Exam  Review of Systems  Blood pressure (!) 190/116, pulse (!) 102, temperature (!) 97.5 F (36.4 C), temperature source Oral, resp. rate 20, height 5\' 6"  (1.676 m), weight 87.1 kg, SpO2 92 %.Body mass index is 30.99 kg/m.  General  Appearance: Casual  Eye Contact:  Fair  Speech:  Clear and Coherent  Volume:  Normal  Mood:  Euthymic  Affect:  Congruent  Thought Process:  Coherent and Linear  Orientation:  Full (Time, Place, and Person)  Thought Content:  Logical  Suicidal Thoughts:  No  Homicidal Thoughts:  No  Memory:  Immediate;   Fair Recent;   Poor Remote;   Fair  Judgement:  Poor  Insight:  Present and Shallow  Psychomotor Activity:  Normal  Concentration:  Concentration: Fair and Attention Span: Fair  Recall:  of Knowledge:  Fair  Language:  Fair  Akathisia:  Negative  Handed:  Right  AIMS (if indicated):     Assets:  Communication Skills Leisure Time Physical Health Resilience  ADL's:  Intact  Cognition:  WNL  Sleep:  Number of Hours: 4        Has this patient used any form of tobacco in the last 30 days? (Cigarettes, Smokeless Tobacco, Cigars, and/or Pipes) Yes, No  Blood Alcohol level:  Lab Results  Component Value Date   ETH <10 04/25/2019   ETH <10 07/15/2018    Metabolic Disorder Labs:  Lab Results  Component Value Date   HGBA1C 5.7 (H) 04/25/2019   MPG 116.89 04/25/2019   Lab Results  Component Value Date   PROLACTIN 102.0 (H) 04/25/2019   Lab Results  Component Value Date   CHOL 286 (H) 04/25/2019   TRIG 328 (H) 04/25/2019   HDL 37 (L) 04/25/2019   CHOLHDL 7.7 04/25/2019   VLDL 66 (H) 04/25/2019   LDLCALC 183 (H) 04/25/2019   LDLCALC 127 (H) 07/09/2018    See Psychiatric Specialty Exam and Suicide Risk Assessment completed by Attending Physician prior to discharge.  Discharge destination:  home  Is patient on multiple antipsychotic therapies at discharge:  No   Has Patient had three or more failed trials of antipsychotic monotherapy by history:  No  Recommended Plan for Multiple Antipsychotic Therapies: NA  Discharge Instructions    Diet - low sodium heart healthy   Complete by: As directed    Increase activity slowly   Complete by: As  directed      Allergies as of 04/29/2019      Reactions   Divalproex Sodium Er Other (See Comments)   Delirium with hyperammonemia   Aspirin    REACTION: swelling   Fish Allergy       Medication List    STOP taking these medications   amLODipine 10 MG tablet Commonly known as: NORVASC   augmented betamethasone dipropionate 0.05 % cream Commonly known as: DIPROLENE-AF  lurasidone 40 MG Tabs tablet Commonly known as: LATUDA     TAKE these medications     Indication  ARIPiprazole ER 400 MG Srer injection Commonly known as: ABILIFY MAINTENA Inject 2 mLs (400 mg total) into the muscle every 28 (twenty-eight) days. Due 5/17 Start taking on: May 26, 2019  Indication: MIXED BIPOLAR AFFECTIVE DISORDER   atorvastatin 20 MG tablet Commonly known as: LIPITOR Take 1 tablet (20 mg total) by mouth at bedtime.  Indication: Inherited Heterozygous Hypercholesterolemia, High Amount of Triglycerides in the Blood   benztropine 1 MG tablet Commonly known as: COGENTIN Take 1 tablet (1 mg total) by mouth daily. What changed:   medication strength  how much to take  when to take this  additional instructions  Indication: Extrapyramidal Reaction caused by Medications   carbamazepine 200 MG 12 hr tablet Commonly known as: TEGretol-XR Take 1 tablet (200 mg total) by mouth 2 (two) times daily.  Indication: Manic-Depression   cloNIDine 0.2 MG tablet Commonly known as: CATAPRES Take 1 tablet (0.2 mg total) by mouth 2 (two) times daily.  Indication: High Blood Pressure Disorder   propranolol ER 120 MG 24 hr capsule Commonly known as: INDERAL LA Take 1 capsule (120 mg total) by mouth daily.  Indication: High Blood Pressure Disorder   risperidone 4 MG tablet Commonly known as: RISPERDAL Take 1 tablet (4 mg total) by mouth 2 (two) times daily.  Indication: Hypomanic Episode of Bipolar Disorder   zolpidem 10 MG tablet Commonly known as: Ambien Take 1 tablet (10 mg total) by  mouth at bedtime as needed for sleep.  Indication: Trouble Sleeping      Follow-up Information    [place] Go to.   Specialty: Professional Counselor Why: Please go to this provider for services during their walk in hours.  For new patients, walk in hours are 8:30 am to 12:00 pm and 1:00 pm to 2:30 pm, Monday through Friday.  Contact information: [address] [place] [address] [phone]        [facility] Go on 05/07/2019.   Why: You are scheduled for an appointment on 05/07/19 at 3:30 for medication management.  This appointment will be held in person.  Please contact this provider to also schedule a therapy appointment as well. Contact information: [address] [place] [address] [phone]          Signed: Malvin Johns, MD 04/29/2019, 7:35 AM

## 2019-04-29 NOTE — Progress Notes (Signed)
Pt continues to be up asking non important things. "why can't I go in the dayroom and watch TV now, see that's what's wrong with you people" pt continues to talk derogatory towards Clinical research associate. Pt continues to get upset when she does not get her way.

## 2019-04-29 NOTE — Progress Notes (Signed)
Pt up to the nursing station numerous times. Pt informed that it was night time and if she did not want to go to sleep she was more than welcome to sit up in  Her room, but not at the nursing station while staff was working. Pt was educated on importance of sleep , pt had no insight into information given.

## 2019-04-29 NOTE — Progress Notes (Signed)
Pt has no insight int Tx

## 2019-04-29 NOTE — Progress Notes (Signed)
D: Patient Presents with appropriate mood and affect.  Patient was calm and cooperative during med pass and took medicine without incident.  Patient was out in open areas and was social with peers and staff.  Patient denies suicidal thoughts and self harming thoughts A:  Patient took scheduled medicine.  Support and encouragement provided Routine safety checks conducted every 15 minutes. Patient  Informed to notify staff with any concerns.  Safety maintained..R:  Patient contracts for safety.  Patient compliant with medication and treatment plan. Patient cooperative and calm. Safety maintained.

## 2019-04-29 NOTE — BHH Suicide Risk Assessment (Signed)
[facility] Discharge Suicide Risk Assessment   Principal Problem: Bipolar I disorder, current or most recent episode manic, with psychotic features [facility]) Discharge Diagnoses: Principal Problem:   Bipolar I disorder, current or most recent episode manic, with psychotic features (HCC) Active Problems:   Schizoaffective disorder (HCC)   Bipolar 1 disorder (HCC) Musculoskeletal: Strength & Muscle Tone: within normal limits Gait & Station: normal Patient leans: N/A  Psychiatric Specialty Exam: Physical Exam  Review of Systems  Blood pressure (!) 190/116, pulse (!) 102, temperature (!) 97.5 F (36.4 C), temperature source Oral, resp. rate 20, height 5\' 6"  (1.676 m), weight 87.1 kg, SpO2 92 %.Body mass index is 30.99 kg/m.  General Appearance: Casual  Eye Contact:  Fair  Speech:  Clear and Coherent  Volume:  Normal  Mood:  Euthymic  Affect:  Congruent  Thought Process:  Coherent and Linear  Orientation:  Full (Time, Place, and Person)  Thought Content:  Logical  Suicidal Thoughts:  No  Homicidal Thoughts:  No  Memory:  Immediate;   Fair Recent;   Poor Remote;   Fair  Judgement:  Poor  Insight:  Present and Shallow  Psychomotor Activity:  Normal  Concentration:  Concentration: Fair and Attention Span: Fair  Recall:  of Knowledge:  Fair  Language:  Fair  Akathisia:  Negative  Handed:  Right  AIMS (if indicated):     Assets:  Communication Skills Leisure Time Physical Health Resilience  ADL's:  Intact  Cognition:  WNL  Sleep:  Number of Hours: 4     Total Time spent with patient: 45 minutes Mental Status Per Nursing Assessment::   On Admission:  NA  Demographic Factors:  Unemployed  Loss Factors: Decrease in vocational status  Historical Factors: Impulsivity  Risk Reduction Factors:   Sense of responsibility to family and Religious beliefs about death  Continued Clinical Symptoms:  Previous Psychiatric Diagnoses and Treatments  Cognitive Features  That Contribute To Risk:  Loss of executive function    Suicide Risk:  Minimal: No identifiable suicidal ideation.  Patients presenting with no risk factors but with morbid ruminations; may be classified as minimal risk based on the severity of the depressive symptoms  Follow-up Information    [phone], [facility] Go to.   Specialty: Professional Counselor Why: Please go to this provider for services during their walk in hours.  For new patients, walk in hours are 8:30 am to 12:00 pm and 1:00 pm to 2:30 pm, Monday through Friday.  Contact information: [address] [place] [phone]        [facility] Go on 05/07/2019.   Why: You are scheduled for an appointment on 05/07/19 at 3:30 for medication management.  This appointment will be held in person.  Please contact this provider to also schedule a therapy appointment as well. Contact information: [address] [place] [phone]           Plan Of Care/Follow-up recommendations:  Activity:  full  Honestee Revard, MD 04/29/2019, 7:40 AM

## 2019-04-29 NOTE — Progress Notes (Signed)
  [facility] Adult Case Management Discharge Plan :  Will you be returning to the same living situation after discharge:  Yes,  with mother At discharge, do you have transportation home?: Yes,  cousin Do you have the ability to pay for your medications: Yes,  pt pays cash  Release of information consent forms completed and in the chart;  Patient's signature needed at discharge.  Patient to Follow up at: Follow-up Information    [place], [facility] Go to.   Specialty: Professional Counselor Why: Please go to this provider for services during their walk in hours.  For new patients, walk in hours are 8:30 am to 12:00 pm and 1:00 pm to 2:30 pm, Monday through Friday.  Contact information: [address] [place] [address] [phone]        [facility] Go on 05/07/2019.   Why: You are scheduled for an appointment on 05/07/19 at 3:30 for medication management.  This appointment will be held in person.  Please contact this provider to also schedule a therapy appointment as well. Contact information: [address] [place] [address] [phone]           Next level of care provider has access to [facility] Link:no  Safety Planning and Suicide Prevention discussed: No. Attempts made.      Has patient been referred to the Quitline?: Patient refused referral  Patient has been referred for addiction treatment: N/A  Lorri Frederick, LCSW 04/29/2019, 9:09 AM

## 2019-05-13 ENCOUNTER — Ambulatory Visit (INDEPENDENT_AMBULATORY_CARE_PROVIDER_SITE_OTHER): Payer: Medicare Other | Admitting: Internal Medicine

## 2019-05-13 ENCOUNTER — Encounter: Payer: Self-pay | Admitting: Internal Medicine

## 2019-05-13 ENCOUNTER — Other Ambulatory Visit: Payer: Self-pay

## 2019-05-13 VITALS — BP 162/106 | HR 84 | Temp 97.3°F | Resp 18 | Ht 66.0 in | Wt 202.2 lb

## 2019-05-13 DIAGNOSIS — E785 Hyperlipidemia, unspecified: Secondary | ICD-10-CM

## 2019-05-13 DIAGNOSIS — Z0000 Encounter for general adult medical examination without abnormal findings: Secondary | ICD-10-CM

## 2019-05-13 DIAGNOSIS — Z1272 Encounter for screening for malignant neoplasm of vagina: Secondary | ICD-10-CM

## 2019-05-13 MED ORDER — PROPRANOLOL HCL ER 160 MG PO CP24: 160.0000 mg | ORAL_CAPSULE | Freq: Every day | ORAL | 1 refills | Status: DC

## 2019-05-13 MED ORDER — ATORVASTATIN CALCIUM 20 MG PO TABS: 20.0000 mg | ORAL_TABLET | Freq: Every day | ORAL | 1 refills | Status: DC

## 2019-05-13 NOTE — Progress Notes (Signed)
Subjective:    Patient ID: Chelsea [name], female    DOB: Jul 04, 1970, 49 y.o.   MRN: [id number]  DOS:  05/13/2019 Type of visit - description: CPX Was recently admitted to the hospital due to a bipolar episode. BP noted to be elevated. Since she left the hospital, reports is doing well.    Review of Systems Reports good compliance with bipolar medicines, has noted increase in appetite. Other than above, a 14 point review of systems is negative    Past Medical History:  Diagnosis Date  . Allergy   . Bipolar disorder ([place])   . Depression   . Eczema   . Elevated cholesterol   . Hypertension   . Schizophrenia [facility])     Past Surgical History:  Procedure Laterality Date  . NO PAST SURGERIES     Family History  Problem Relation Age of Onset  . Breast cancer Mother   . Hypertension Father   . Diabetes Paternal Grandmother   . Colon cancer Neg Hx   . CAD Neg Hx      Allergies as of 05/13/2019      Reactions   Divalproex Sodium Er Other (See Comments)   Delirium with hyperammonemia   Aspirin    REACTION: swelling   Fish Allergy       Medication List       Accurate as of May 13, 2019 11:59 PM. If you have any questions, ask your nurse or doctor.        ARIPiprazole ER 400 MG Srer injection Commonly known as: ABILIFY MAINTENA Inject 2 mLs (400 mg total) into the muscle every 28 (twenty-eight) days. Due 5/17 Start taking on: May 26, 2019   atorvastatin 20 MG tablet Commonly known as: LIPITOR Take 1 tablet (20 mg total) by mouth at bedtime.   benztropine 1 MG tablet Commonly known as: COGENTIN Take 1 tablet (1 mg total) by mouth daily.   carbamazepine 200 MG 12 hr tablet Commonly known as: TEGretol-XR Take 1 tablet (200 mg total) by mouth 2 (two) times daily.   cloNIDine 0.2 MG tablet Commonly known as: CATAPRES Take 1 tablet (0.2 mg total) by mouth 2 (two) times daily.   propranolol ER 160 MG SR capsule Commonly known as: INDERAL LA Take 1 capsule (160  mg total) by mouth daily. What changed:   medication strength  how much to take Changed by: Kathlene November, MD   risperidone 4 MG tablet Commonly known as: RISPERDAL Take 1 tablet (4 mg total) by mouth 2 (two) times daily.   zolpidem 10 MG tablet Commonly known as: Ambien Take 1 tablet (10 mg total) by mouth at bedtime as needed for sleep.          Objective:   Physical Exam BP (!) 162/106 (BP Location: Left Arm, Patient Position: Sitting, Cuff Size: Normal)   Pulse 84   Temp (!) 97.3 F (36.3 C) (Temporal)   Resp 18   Ht 5\' 6"  (1.676 m)   Wt 202 lb 4 oz (91.7 kg)   LMP 05/11/2019 (Exact Date)   SpO2 93%   BMI 32.64 kg/m  General: Well developed, NAD, BMI noted Neck: No  thyromegaly  HEENT:  Normocephalic . Face symmetric, atraumatic Lungs:  CTA B Normal respiratory effort, no intercostal retractions, no accessory muscle use. Heart: RRR,  no murmur.  Abdomen:  Not distended, soft, non-tender. No rebound or rigidity.   Lower extremities: no pretibial edema bilaterally  Skin: Skin at the anterior  areas of the neck seems a slightly raised, dry and thick Neurologic:  alert & oriented X3.  Speech normal, gait appropriate for age and unassisted Strength symmetric and appropriate for age.  Psych: Cognition and judgment appear intact.  Cooperative with normal attention span and concentration.  Behavior appropriate. No anxious or depressed appearing.     Assessment      Assessment HTN Hyperlipidemia Bipolar disorder; schizophrenia (dx as teenager); seen in [place]; h/o admissions Eczema. [facility]) Tobacco abuse     PLAN: Here for CPX Bipolar: Admitted to the hospital 04/23/2019 and discharged 4 days later.  Had episodic manic episode, psychotic features.  Currently she seems to be doing okay, had an outpatient follow-up for psychiatric care, reports good med compliance. HTN: During recent admission, BP was noted to be elevated while on amlodipine and  Catapres, amlodipine was switched to Inderal, BP today is elevated, plan is to continue clonidine and  increase Inderal LA to 160 mg.  Monitor BPs at home.  Recheck in 6 weeks. High cholesterol: self d/c meds x a while, last LDL 183 while in the hospital, plan: Restart Lipitor, reassess in 6 weeks RTC 6 weeks      This visit occurred during the SARS-CoV-2 public health emergency.  Safety protocols were in place, including screening questions prior to the visit, additional usage of staff PPE, and extensive cleaning of exam room while observing appropriate contact time as indicated for disinfecting solutions.

## 2019-05-13 NOTE — Patient Instructions (Addendum)
Please schedule Medicare Wellness with Lawanna Kobus.   Increase Inderal LA to the new strength of 160 mg Check the  blood pressure 2 or 3 times a week BP GOAL is between 110/65 and  135/85. If it is consistently higher or lower, let me know  Restart atorvastatin, we sent a prescription   We are referring you to the same office as Dr. [name] for the gynecological exam    GO TO THE FRONT DESK, PLEASE SCHEDULE YOUR APPOINTMENTS Come back for   a checkup in 6 weeks, fasting

## 2019-05-13 NOTE — Progress Notes (Signed)
Pre visit review using our clinic review tool, if applicable. No additional management support is needed unless otherwise documented below in the visit note. 

## 2019-05-14 NOTE — Assessment & Plan Note (Signed)
-  Vaccines: Declines all, benefits discussed -Female care: Last Pap smear 01-2016, last mammogram 01-2019 ([facility]).  Refer to gynecology -CCS: Never had a colonoscopy, 3 options discussed, we agreed on a I fob -Diet and exercise discussed -Labs reviewed -Tobacco abuse: Has cut down to less than a pack a day.  Reassess periodically

## 2019-05-14 NOTE — Assessment & Plan Note (Signed)
Here for CPX Bipolar: Admitted to the hospital 04/23/2019 and discharged 4 days later.  Had episodic manic episode, psychotic features.  Currently she seems to be doing okay, had an outpatient follow-up for psychiatric care, reports good med compliance. HTN: During recent admission, BP was noted to be elevated while on amlodipine and Catapres, amlodipine was switched to Inderal, BP today is elevated, plan is to continue clonidine and  increase Inderal LA to 160 mg.  Monitor BPs at home.  Recheck in 6 weeks. High cholesterol: self d/c meds x a while, last LDL 183 while in the hospital, plan: Restart Lipitor, reassess in 6 weeks RTC 6 weeks

## 2019-06-18 ENCOUNTER — Ambulatory Visit: Payer: Medicare HMO | Admitting: Internal Medicine

## 2019-06-24 ENCOUNTER — Other Ambulatory Visit: Payer: Self-pay

## 2019-06-24 ENCOUNTER — Ambulatory Visit (INDEPENDENT_AMBULATORY_CARE_PROVIDER_SITE_OTHER): Payer: Medicare Other | Admitting: Internal Medicine

## 2019-06-24 ENCOUNTER — Encounter: Payer: Self-pay | Admitting: Internal Medicine

## 2019-06-24 VITALS — BP 150/99 | HR 88 | Temp 97.2°F | Resp 16 | Ht 66.0 in | Wt 204.5 lb

## 2019-06-24 DIAGNOSIS — E785 Hyperlipidemia, unspecified: Secondary | ICD-10-CM

## 2019-06-24 DIAGNOSIS — I10 Essential (primary) hypertension: Secondary | ICD-10-CM

## 2019-06-24 MED ORDER — AMLODIPINE BESYLATE 5 MG PO TABS: 5.0000 mg | ORAL_TABLET | Freq: Every day | ORAL | 6 refills | Status: DC

## 2019-06-24 NOTE — Progress Notes (Signed)
Pre visit review using our clinic review tool, if applicable. No additional management support is needed unless otherwise documented below in the visit note. 

## 2019-06-24 NOTE — Progress Notes (Signed)
Subjective:    Patient ID: Chelsea [name], female    DOB: 10/25/1970, 49 y.o.   MRN: [id number]  DOS:  06/24/2019 Type of visit - description: Routine follow-up Today with talk about high cholesterol and hypertension. No ambulatory BPs, reports good med compliance .   Review of Systems Denies chest pain or difficulty breathing Bipolar symptoms: Not completely well controlled, not suicidal.  Past Medical History:  Diagnosis Date  . Allergy   . Bipolar disorder ([place])   . Depression   . Eczema   . Elevated cholesterol   . Hypertension   . Schizophrenia [facility])     Past Surgical History:  Procedure Laterality Date  . NO PAST SURGERIES      Allergies as of 06/24/2019      Reactions   Divalproex Sodium Er Other (See Comments)   Delirium with hyperammonemia   Aspirin    REACTION: swelling   Fish Allergy       Medication List       Accurate as of June 24, 2019 11:03 AM. If you have any questions, ask your nurse or doctor.        ARIPiprazole ER 400 MG Srer injection Commonly known as: ABILIFY MAINTENA Inject 2 mLs (400 mg total) into the muscle every 28 (twenty-eight) days. Due 5/17   atorvastatin 20 MG tablet Commonly known as: LIPITOR Take 1 tablet (20 mg total) by mouth at bedtime.   benztropine 1 MG tablet Commonly known as: COGENTIN Take 1 tablet (1 mg total) by mouth daily.   carbamazepine 200 MG 12 hr tablet Commonly known as: TEGretol-XR Take 1 tablet (200 mg total) by mouth 2 (two) times daily.   cloNIDine 0.2 MG tablet Commonly known as: CATAPRES Take 1 tablet (0.2 mg total) by mouth 2 (two) times daily.   propranolol ER 160 MG SR capsule Commonly known as: INDERAL LA Take 1 capsule (160 mg total) by mouth daily.   risperidone 4 MG tablet Commonly known as: RISPERDAL Take 1 tablet (4 mg total) by mouth 2 (two) times daily.   zolpidem 10 MG tablet Commonly known as: Ambien Take 1 tablet (10 mg total) by mouth at bedtime as needed for  sleep.          Objective:   Physical Exam BP (!) 150/99 (BP Location: Left Arm, Patient Position: Sitting, Cuff Size: Normal)   Pulse 88   Temp (!) 97.2 F (36.2 C) (Temporal)   Resp 16   Ht 5\' 6"  (1.676 m)   Wt 204 lb 8 oz (92.8 kg)   LMP 06/10/2019 (Approximate)   SpO2 94%   BMI 33.01 kg/m  General:   Well developed, NAD, BMI noted. HEENT:  Normocephalic . Face symmetric, atraumatic Lungs:  CTA B Normal respiratory effort, no intercostal retractions, no accessory muscle use. Heart: RRR,  no murmur.  Lower extremities: no pretibial edema bilaterally  Skin: Not pale. Not jaundice Neurologic:  alert & oriented X3.  Speech normal, gait appropriate for age and unassisted Psych--  Cognition and judgment appear intact.  Cooperative with normal attention span and concentration.  Behavior appropriate. Slightly apprehensive appearing     Assessment     ASSESSMENT HTN Hyperlipidemia Bipolar disorder; schizophrenia (dx as teenager); seen in [place]; h/o admissions Eczema. [facility]) Tobacco abuse     PLAN: HTN: See last visit, no ambulatory BPs, records good compliance with Inderal and clonidine (heart rate 88, BBs compliance?). BP rechecked by me manually, I obtain it 155/100.  Plan: Add amlodipine 5 mg daily, ambulatory BPs, reassess in 4 months High cholesterol: On Lipitor, she is not fasting, recommend labs tomorrow, see AVS. Bipolar: She looks somewhat apprehensive, has an appointment to see her behavioral provider today. RTC 4 months  This visit occurred during the SARS-CoV-2 public health emergency.  Safety protocols were in place, including screening questions prior to the visit, additional usage of staff PPE, and extensive cleaning of exam room while observing appropriate contact time as indicated for disinfecting solutions.

## 2019-06-24 NOTE — Patient Instructions (Addendum)
Please schedule Medicare Wellness with Lawanna Kobus.   COVID-19 Vaccine Information can be found at: [email or web address] For questions related to vaccine distribution or appointments, please email [email or web address] or call [phone].   Continue the same medications, in addition take  amlodipine 5 mg 1 tablet at bedtime  Check the  blood pressure  Weekly  BP GOAL is between 110/65 and  135/85. If it is consistently higher or lower, let me know  You need to do blood work at another [facility] location [address], located across the street from [facility]. You don't need an appointment, go to the basement and get your blood work.   GO TO THE FRONT DESK, PLEASE SCHEDULE YOUR APPOINTMENTS Come back for   for a checkup in 4 months

## 2019-06-25 NOTE — Assessment & Plan Note (Signed)
HTN: See last visit, no ambulatory BPs, records good compliance with Inderal and clonidine (heart rate 88, BBs compliance?). BP rechecked by me manually, I obtain it 155/100. Plan: Add amlodipine 5 mg daily, ambulatory BPs, reassess in 4 months High cholesterol: On Lipitor, she is not fasting, recommend labs tomorrow, see AVS. Bipolar: She looks somewhat apprehensive, has an appointment to see her behavioral provider today. RTC 4 months

## 2019-07-02 ENCOUNTER — Ambulatory Visit: Payer: Medicare HMO | Admitting: Obstetrics and Gynecology

## 2019-07-21 ENCOUNTER — Telehealth: Payer: Self-pay | Admitting: Internal Medicine

## 2019-07-21 MED ORDER — ATORVASTATIN CALCIUM 20 MG PO TABS: 20.0000 mg | ORAL_TABLET | Freq: Every day | ORAL | 1 refills | Status: DC

## 2019-07-21 NOTE — Telephone Encounter (Signed)
Per patient insurance, she is requesting a 90Day Supply    Medication: atorvastatin (LIPITOR) 20 MG tablet [[id number]]    Has the patient contacted their pharmacy? No. (If no, request that the patient contact the pharmacy for the refill.) (If yes, when and what did the pharmacy advise?)  Preferred Pharmacy (with phone number or street name): [facility] [address] [place], [place] - [address]  [address], [place] [address]  Phone:  [phone] Fax:  [phone]  DEA #:  --  Agent: Please be advised that RX refills may take up to 3 business days. We ask that you follow-up with your pharmacy.

## 2019-07-21 NOTE — Telephone Encounter (Signed)
Open in error

## 2019-07-21 NOTE — Telephone Encounter (Signed)
Rx sent 

## 2019-07-23 ENCOUNTER — Encounter: Payer: Self-pay | Admitting: Internal Medicine

## 2019-08-14 ENCOUNTER — Encounter: Payer: Self-pay | Admitting: Internal Medicine

## 2019-08-14 ENCOUNTER — Ambulatory Visit (INDEPENDENT_AMBULATORY_CARE_PROVIDER_SITE_OTHER): Payer: Medicare Other | Admitting: Internal Medicine

## 2019-08-14 ENCOUNTER — Other Ambulatory Visit: Payer: Self-pay

## 2019-08-14 VITALS — BP 164/113 | HR 68 | Temp 98.0°F | Resp 16 | Ht 66.0 in | Wt 206.4 lb

## 2019-08-14 DIAGNOSIS — R6881 Early satiety: Secondary | ICD-10-CM

## 2019-08-14 DIAGNOSIS — F312 Bipolar disorder, current episode manic severe with psychotic features: Secondary | ICD-10-CM | POA: Diagnosis not present

## 2019-08-14 DIAGNOSIS — R1013 Epigastric pain: Secondary | ICD-10-CM | POA: Diagnosis not present

## 2019-08-14 DIAGNOSIS — R109 Unspecified abdominal pain: Secondary | ICD-10-CM

## 2019-08-14 DIAGNOSIS — Z789 Other specified health status: Secondary | ICD-10-CM | POA: Diagnosis not present

## 2019-08-14 DIAGNOSIS — I10 Essential (primary) hypertension: Secondary | ICD-10-CM

## 2019-08-14 LAB — POC URINALSYSI DIPSTICK (AUTOMATED)
Bilirubin, UA: NEGATIVE
Blood, UA: NEGATIVE
Glucose, UA: NEGATIVE
Ketones, UA: NEGATIVE
Leukocytes, UA: NEGATIVE
Nitrite, UA: NEGATIVE
Protein, UA: POSITIVE — AB
Spec Grav, UA: 1.02 (ref 1.010–1.025)
Urobilinogen, UA: 0.2 U/dL
pH, UA: 6.5 (ref 5.0–8.0)

## 2019-08-14 LAB — CBC WITH DIFFERENTIAL/PLATELET
Basophils Absolute: 0 K/uL (ref 0.0–0.1)
Basophils Relative: 0.4 % (ref 0.0–3.0)
Eosinophils Absolute: 0.6 K/uL (ref 0.0–0.7)
Eosinophils Relative: 8.3 % — ABNORMAL HIGH (ref 0.0–5.0)
HCT: 38.1 % (ref 36.0–46.0)
Hemoglobin: 12.5 g/dL (ref 12.0–15.0)
Lymphocytes Relative: 31.8 % (ref 12.0–46.0)
Lymphs Abs: 2.2 K/uL (ref 0.7–4.0)
MCHC: 32.9 g/dL (ref 30.0–36.0)
MCV: 90.4 fl (ref 78.0–100.0)
Monocytes Absolute: 0.7 K/uL (ref 0.1–1.0)
Monocytes Relative: 10.8 % (ref 3.0–12.0)
Neutro Abs: 3.3 K/uL (ref 1.4–7.7)
Neutrophils Relative %: 48.7 % (ref 43.0–77.0)
Platelets: 303 K/uL (ref 150.0–400.0)
RBC: 4.21 Mil/uL (ref 3.87–5.11)
RDW: 16.2 % — ABNORMAL HIGH (ref 11.5–15.5)
WBC: 6.8 K/uL (ref 4.0–10.5)

## 2019-08-14 LAB — HEPATIC FUNCTION PANEL
ALT: 27 U/L (ref 0–35)
AST: 18 U/L (ref 0–37)
Albumin: 3.7 g/dL (ref 3.5–5.2)
Alkaline Phosphatase: 135 U/L — ABNORMAL HIGH (ref 39–117)
Bilirubin, Direct: 0 mg/dL (ref 0.0–0.3)
Total Bilirubin: 0.2 mg/dL (ref 0.2–1.2)
Total Protein: 6.6 g/dL (ref 6.0–8.3)

## 2019-08-14 LAB — POCT URINE PREGNANCY: Preg Test, Ur: NEGATIVE

## 2019-08-14 NOTE — Patient Instructions (Signed)
Continue the same medications  Watch her salt intake  Take a walk daily up to 30 minutes  Check the  blood pressure 2 or 3 times a week BP GOAL is between 110/65 and  135/85. If it is consistently higher or lower, let me know   Strongly recommend to get the Covid vaccinations  GO TO THE LAB : Get the blood work     GO TO THE FRONT DESK, PLEASE SCHEDULE YOUR APPOINTMENTS Come back for a checkup in 2 months  STOP BY THE FIRST FLOOR: Schedule the ultrasound

## 2019-08-14 NOTE — Progress Notes (Signed)
Subjective:    Patient ID: Chelsea [name], female    DOB: 05/17/1970, 49 y.o.   MRN: [id number]  DOS:  08/14/2019 Type of visit - description: Acute Few months history of dyspepsia , described as early satiety & postprandial bloating. Denies having nausea or vomiting. She is typically regular on her bowel movements but lately has been noting episodes of diarrhea and some days she is constipated.  No fever chills No weight loss No blood in the stools No heartburn per se, no dysphagia or odynophagia.  BP noted to be elevated, no ambulatory BPs. Reports that sometimes she has R>L ankle swelling mostly at the inner aspects   BP Readings from Last 3 Encounters:  08/14/19 (!) 164/113  06/24/19 (!) 150/99  05/13/19 (!) 162/106   Wt Readings from Last 3 Encounters:  08/14/19 206 lb 6 oz (93.6 kg)  06/24/19 204 lb 8 oz (92.8 kg)  05/13/19 202 lb 4 oz (91.7 kg)     Review of Systems Menses are regular, LMP?  Does not recall. No dysuria or gross hematuria.  Past Medical History:  Diagnosis Date  . Allergy   . Bipolar disorder ([facility])   . Depression   . Eczema   . Elevated cholesterol   . Hypertension   . Schizophrenia [facility])     Past Surgical History:  Procedure Laterality Date  . NO PAST SURGERIES      Allergies as of 08/14/2019      Reactions   Divalproex Sodium Er Other (See Comments)   Delirium with hyperammonemia   Aspirin    REACTION: swelling   Fish Allergy       Medication List       Accurate as of August 14, 2019 11:52 AM. If you have any questions, ask your nurse or doctor.        amLODipine 5 MG tablet Commonly known as: NORVASC Take 1 tablet (5 mg total) by mouth daily.   ARIPiprazole ER 400 MG Srer injection Commonly known as: ABILIFY MAINTENA Inject 2 mLs (400 mg total) into the muscle every 28 (twenty-eight) days. Due 5/17   atorvastatin 20 MG tablet Commonly known as: LIPITOR Take 1 tablet (20 mg total) by mouth at bedtime.   benztropine 1  MG tablet Commonly known as: COGENTIN Take 1 tablet (1 mg total) by mouth daily.   carbamazepine 200 MG 12 hr tablet Commonly known as: TEGretol-XR Take 1 tablet (200 mg total) by mouth 2 (two) times daily.   cloNIDine 0.2 MG tablet Commonly known as: CATAPRES Take 1 tablet (0.2 mg total) by mouth 2 (two) times daily.   propranolol ER 160 MG SR capsule Commonly known as: INDERAL LA Take 1 capsule (160 mg total) by mouth daily.   risperidone 4 MG tablet Commonly known as: RISPERDAL Take 1 tablet (4 mg total) by mouth 2 (two) times daily.   zolpidem 10 MG tablet Commonly known as: Ambien Take 1 tablet (10 mg total) by mouth at bedtime as needed for sleep.          Objective:   Physical Exam BP (!) 164/113 (BP Location: Right Arm, Patient Position: Sitting, Cuff Size: Normal)   Pulse 68   Temp 98 F (36.7 C) (Oral)   Resp 16   Ht 5\' 6"  (1.676 m)   Wt 206 lb 6 oz (93.6 kg)   LMP  (LMP Unknown)   SpO2 92%   BMI 33.31 kg/m  General:   Well developed, NAD, BMI noted.  HEENT:  Normocephalic . Face symmetric, atraumatic Lungs:  CTA B Normal respiratory effort, no intercostal retractions, no accessory muscle use. Heart: RRR,  no murmur.  Abdomen:  Not distended, soft, non-tender. No rebound or rigidity.   Skin: Not pale. Not jaundice Lower extremities: no pretibial edema bilaterally  Neurologic:  alert & oriented X3.  Speech normal, gait appropriate for age and unassisted Psych--  Cognition and judgment appear intact.  Cooperative with normal attention span and concentration.  Behavior appropriate. No anxious or depressed appearing.     Assessment     ASSESSMENT HTN Hyperlipidemia Bipolar disorder; schizophrenia (dx as teenager); seen in [place]; h/o admissions Eczema. [facility]) Tobacco abuse     PLAN: Dyspepsia: As described above, early satiety, occasional diarrhea and constipation. She takes carbamazepine that could account for some of her GI   symptoms. She is 49 y/o, never had a scope. Plan: UPT negative.  Urine dip: Small protein. US abdomen, LFTs, CBC, carbamazepine level  Consider GI referral based on results and follow-up HTN: Needs better control, currently on Inderal, clonidine, amlodipine (complain of edema, but no swelling on exam).  Rec to increase clonidine dose, she is quite reluctant, we agreed she will work on a low salt diet and start daily walks.  Recheck BP on RTC Strongly recommend to get a Covid vaccination RTC 2 months  This visit occurred during the SARS-CoV-2 public health emergency.  Safety protocols were in place, including screening questions prior to the visit, additional usage of staff PPE, and extensive cleaning of exam room while observing appropriate contact time as indicated for disinfecting solutions.

## 2019-08-14 NOTE — Progress Notes (Signed)
Pre visit review using our clinic review tool, if applicable. No additional management support is needed unless otherwise documented below in the visit note. 

## 2019-08-15 LAB — CARBAMAZEPINE LEVEL, TOTAL: Carbamazepine Lvl: 5.3 mg/L (ref 4.0–12.0)

## 2019-08-15 NOTE — Assessment & Plan Note (Signed)
Dyspepsia: As described above, early satiety, occasional diarrhea and constipation. She takes carbamazepine that could account for some of her GI  symptoms. She is 49 y/o, never had a scope. Plan: UPT negative.  Urine dip: Small protein. US abdomen, LFTs, CBC, carbamazepine level  Consider GI referral based on results and follow-up HTN: Needs better control, currently on Inderal, clonidine, amlodipine (complain of edema, but no swelling on exam).  Rec to increase clonidine dose, she is quite reluctant, we agreed she will work on a low salt diet and start daily walks.  Recheck BP on RTC Strongly recommend to get a Covid vaccination RTC 2 months

## 2019-08-20 ENCOUNTER — Ambulatory Visit (HOSPITAL_BASED_OUTPATIENT_CLINIC_OR_DEPARTMENT_OTHER): Payer: Medicare Other

## 2019-09-02 ENCOUNTER — Other Ambulatory Visit: Payer: Self-pay

## 2019-09-02 ENCOUNTER — Encounter: Payer: Self-pay | Admitting: Internal Medicine

## 2019-09-02 DIAGNOSIS — R1013 Epigastric pain: Secondary | ICD-10-CM

## 2019-10-20 ENCOUNTER — Other Ambulatory Visit: Payer: Self-pay

## 2019-10-20 ENCOUNTER — Ambulatory Visit (INDEPENDENT_AMBULATORY_CARE_PROVIDER_SITE_OTHER): Payer: Medicare Other | Admitting: Internal Medicine

## 2019-10-20 ENCOUNTER — Encounter: Payer: Self-pay | Admitting: Internal Medicine

## 2019-10-20 VITALS — BP 138/80 | HR 79 | Temp 97.8°F | Resp 18 | Ht 66.0 in | Wt 199.1 lb

## 2019-10-20 DIAGNOSIS — R7989 Other specified abnormal findings of blood chemistry: Secondary | ICD-10-CM

## 2019-10-20 DIAGNOSIS — R1013 Epigastric pain: Secondary | ICD-10-CM | POA: Diagnosis not present

## 2019-10-20 DIAGNOSIS — I10 Essential (primary) hypertension: Secondary | ICD-10-CM | POA: Diagnosis not present

## 2019-10-20 DIAGNOSIS — E785 Hyperlipidemia, unspecified: Secondary | ICD-10-CM

## 2019-10-20 NOTE — Progress Notes (Signed)
Pre visit review using our clinic review tool, if applicable. No additional management support is needed unless otherwise documented below in the visit note. 

## 2019-10-20 NOTE — Assessment & Plan Note (Signed)
HTN: No ambulatory BPs, BP today is very good, reports compliance with amlodipine, clonidine, Inderal.  No change, do recommend to check ambulatory BPs. Dyspepsia: Continue with early satiety, occasional diarrhea and constipation.  Ultrasound show gallbladder sludge and fatty liver.  Will see GI soon. Increase LFTs: Last Phosphate is slightly elevated, recheck LFTs and GGT. High cholesterol: Was off atorvastatin for a while, but reports good compliance now, check FLP. Preventive care: Hep C screening, declines all shots. RTC 6 months

## 2019-10-20 NOTE — Progress Notes (Signed)
Subjective:    Patient ID: Chelsea [name], female    DOB: 05/06/1970, 49 y.o.   MRN: [id number]  DOS:  10/20/2019 Type of visit - description: Follow-up from previous visit  Dyspepsia unchanged HTN: Good compliance with medication, no ambulatory BPs High cholesterol: Good compliance with Lipitor  Review of Systems Denies fever chills or weight loss No blood in the stools  Past Medical History:  Diagnosis Date  . Allergy   . Bipolar disorder ([facility])   . Depression   . Eczema   . Elevated cholesterol   . Hypertension   . Schizophrenia [facility])     Past Surgical History:  Procedure Laterality Date  . NO PAST SURGERIES      Allergies as of 10/20/2019      Reactions   Divalproex Sodium Er Other (See Comments)   Delirium with hyperammonemia   Aspirin    REACTION: swelling   Fish Allergy       Medication List       Accurate as of October 20, 2019  9:09 PM. If you have any questions, ask your nurse or doctor.        amLODipine 5 MG tablet Commonly known as: NORVASC Take 1 tablet (5 mg total) by mouth daily.   ARIPiprazole ER 400 MG Srer injection Commonly known as: ABILIFY MAINTENA Inject 2 mLs (400 mg total) into the muscle every 28 (twenty-eight) days. Due 5/17   atorvastatin 20 MG tablet Commonly known as: LIPITOR Take 1 tablet (20 mg total) by mouth at bedtime.   benztropine 1 MG tablet Commonly known as: COGENTIN Take 1 tablet (1 mg total) by mouth daily.   carbamazepine 200 MG 12 hr tablet Commonly known as: TEGretol-XR Take 1 tablet (200 mg total) by mouth 2 (two) times daily.   cloNIDine 0.2 MG tablet Commonly known as: CATAPRES Take 1 tablet (0.2 mg total) by mouth 2 (two) times daily.   propranolol ER 160 MG SR capsule Commonly known as: INDERAL LA Take 1 capsule (160 mg total) by mouth daily.   risperidone 4 MG tablet Commonly known as: RISPERDAL Take 1 tablet (4 mg total) by mouth 2 (two) times daily.   zolpidem 10 MG tablet Commonly  known as: Ambien Take 1 tablet (10 mg total) by mouth at bedtime as needed for sleep.          Objective:   Physical Exam BP 138/80 (BP Location: Left Arm, Patient Position: Sitting, Cuff Size: Normal)   Pulse 79   Temp 97.8 F (36.6 C) (Oral)   Resp 18   Ht 5\' 6"  (1.676 m)   Wt 199 lb 2 oz (90.3 kg)   LMP  (LMP Unknown)   SpO2 97%   BMI 32.14 kg/m  General:   Well developed, NAD, BMI noted.  HEENT:  Normocephalic . Face symmetric, atraumatic Abdomen:  Not distended, soft, non-tender. No rebound or rigidity.   Skin: Not pale. Not jaundice Lower extremities: no pretibial edema bilaterally  Neurologic:  alert & oriented X3.  Speech normal, gait appropriate for age and unassisted Psych--  Cognition and judgment appear intact.  Cooperative with normal attention span and concentration.  Behavior appropriate. No anxious or depressed appearing.     Assessment    ASSESSMENT HTN Hyperlipidemia Bipolar disorder; schizophrenia (dx as teenager); seen in [place]; h/o admissions Eczema. [facility]) Tobacco abuse    PLAN: HTN: No ambulatory BPs, BP today is very good, reports compliance with amlodipine, clonidine, Inderal.  No  change, do recommend to check ambulatory BPs. Dyspepsia: Continue with early satiety, occasional diarrhea and constipation.  Ultrasound show gallbladder sludge and fatty liver.  Will see GI soon. Increase LFTs: Last Phosphate is slightly elevated, recheck LFTs and GGT. High cholesterol: Was off atorvastatin for a while, but reports good compliance now, check FLP. Preventive care: Hep C screening, declines all shots. RTC 6 months   This visit occurred during the SARS-CoV-2 public health emergency.  Safety protocols were in place, including screening questions prior to the visit, additional usage of staff PPE, and extensive cleaning of exam room while observing appropriate contact time as indicated for disinfecting solutions.

## 2019-10-20 NOTE — Patient Instructions (Signed)
Check the  blood pressure  weekly  BP GOAL is between 110/65 and  135/85. If it is consistently higher or lower, let me know  ting and record the average.    GO TO THE LAB : Get the blood work     GO TO THE FRONT DESK, PLEASE SCHEDULE YOUR APPOINTMENTS Come back for a check up in 6 months

## 2019-10-21 LAB — LIPID PANEL
Cholesterol: 185 mg/dL (ref <200)
HDL: 41 mg/dL — ABNORMAL LOW (ref >=50)
LDL Cholesterol (Calc): 106 mg/dL (calc) — ABNORMAL HIGH
Non-HDL Cholesterol (Calc): 144 mg/dL (calc) — ABNORMAL HIGH (ref <130)
Total CHOL/HDL Ratio: 4.5 (calc) (ref <5.0)
Triglycerides: 265 mg/dL — ABNORMAL HIGH (ref <150)

## 2019-10-21 LAB — HEPATIC FUNCTION PANEL
AG Ratio: 1.3 (calc) (ref 1.0–2.5)
ALT: 33 U/L — ABNORMAL HIGH (ref 6–29)
AST: 24 U/L (ref 10–35)
Albumin: 3.8 g/dL (ref 3.6–5.1)
Alkaline phosphatase (APISO): 128 U/L — ABNORMAL HIGH (ref 31–125)
Bilirubin, Direct: 0.1 mg/dL (ref 0.0–0.2)
Globulin: 2.9 g/dL (calc) (ref 1.9–3.7)
Indirect Bilirubin: 0.2 mg/dL (calc) (ref 0.2–1.2)
Total Bilirubin: 0.3 mg/dL (ref 0.2–1.2)
Total Protein: 6.7 g/dL (ref 6.1–8.1)

## 2019-10-21 LAB — HEPATITIS C ANTIBODY
Hepatitis C Ab: NONREACTIVE
SIGNAL TO CUT-OFF: 0 (ref <1.00)

## 2019-10-21 LAB — GAMMA GT: GGT: 179 U/L — ABNORMAL HIGH (ref 3–55)

## 2019-11-03 ENCOUNTER — Ambulatory Visit: Payer: Medicare Other | Admitting: Internal Medicine

## 2019-11-03 ENCOUNTER — Other Ambulatory Visit: Payer: Medicare Other

## 2019-11-03 ENCOUNTER — Encounter: Payer: Self-pay | Admitting: Internal Medicine

## 2019-11-03 VITALS — BP 130/88 | HR 82 | Ht 66.0 in | Wt 195.0 lb

## 2019-11-03 DIAGNOSIS — R1013 Epigastric pain: Secondary | ICD-10-CM | POA: Diagnosis not present

## 2019-11-03 DIAGNOSIS — K760 Fatty (change of) liver, not elsewhere classified: Secondary | ICD-10-CM

## 2019-11-03 DIAGNOSIS — R7989 Other specified abnormal findings of blood chemistry: Secondary | ICD-10-CM

## 2019-11-03 DIAGNOSIS — R935 Abnormal findings on diagnostic imaging of other abdominal regions, including retroperitoneum: Secondary | ICD-10-CM

## 2019-11-03 NOTE — Patient Instructions (Signed)
Your provider has requested that you go to the basement level for lab work before leaving today. Press "B" on the elevator. The lab is located at the first door on the left as you exit the elevator.  You have been scheduled for an endoscopy. Please follow written instructions given to you at your visit today. If you use inhalers (even only as needed), please bring them with you on the day of your procedure.   

## 2019-11-03 NOTE — Progress Notes (Signed)
HISTORY OF PRESENT ILLNESS:  Chelsea [name] is a 49 y.o. female, unemployed prior Actuary, who presents today for evaluation of dyspepsia and has elevated liver tests.  No prior GI history or GI evaluations.  Patient reports a several month history of post prandial gastric upset accompanied by nausea but no vomiting.  No pyrosis.  No NSAID use.  She does report decreased appetite with modest weight loss.  No melena.  She did undergo abdominal ultrasound August 27, 2019.  She was noted to have gallbladder sludge and hepatic steatosis.  General laboratories were unremarkable except for mildly elevated ALT and alkaline phosphatase.  Hepatitis serologies were negative.  No family history of gastrointestinal malignancy.  She is aware colon cancer screening.  Anticipating this at age 26.  She has has not been vaccinated against Covid  REVIEW OF SYSTEMS:  All non-GI ROS negative otherwise stated in the HPI except for sleeping problems  Past Medical History:  Diagnosis Date  . Allergy   . Bipolar disorder ([facility])   . Depression   . Eczema   . Elevated cholesterol   . Hypertension   . Schizophrenia [facility])     Past Surgical History:  Procedure Laterality Date  . NO PAST SURGERIES      Social History Chelsea [name]  reports that she has been smoking cigarettes. She has been smoking about 1.00 pack per day. She has never used smokeless tobacco. She reports previous alcohol use. She reports that she does not use drugs.  family history includes Breast cancer in her mother; Diabetes in her paternal grandmother; Hypertension in her father.  Allergies  Allergen Reactions  . Divalproex Sodium Er Other (See Comments)    Delirium with hyperammonemia  . Aspirin     REACTION: swelling  . Fish Allergy        PHYSICAL EXAMINATION: Vital signs: BP 130/88   Pulse 82   Ht 5\' 6"  (1.676 m)   Wt 195 lb (88.5 kg)   LMP  (LMP Unknown)   SpO2 96%   BMI 31.47 kg/m   Constitutional: generally  well-appearing, no acute distress Psychiatric: alert and oriented x3, cooperative Eyes: extraocular movements intact, anicteric, conjunctiva pink Mouth: oral pharynx moist, no lesions Neck: supple no lymphadenopathy Cardiovascular: heart regular rate and rhythm, no murmur Lungs: clear to auscultation bilaterally Abdomen: soft, nontender, nondistended, no obvious ascites, no peritoneal signs, normal bowel sounds, no organomegaly Rectal: Omitted Extremities: no clubbing, cyanosis, or lower extremity edema bilaterally Skin: no lesions on visible extremities Neuro: No focal deficits.  Cranial nerves intact  ASSESSMENT:  1.  Chronic postprandial dyspeptic symptoms.  Rule out acid peptic disorders. 2.  Mild elevation of liver test.  Suspect fatty liver 3.  Abnormal ultrasound of the abdomen with fatty liver changes noted 4.  Obesity 5.  Colon cancer screening.  Discussed current guidelines.  She will consider this for the future   PLAN:  1.  Check AMA given elevated alkaline phosphatase 2.  Schedule upper endoscopy to evaluate chronic dyspeptic symptoms. The nature of the procedure, as well as the risks, benefits, and alternatives were carefully and thoroughly reviewed with the patient. Ample time for discussion and questions allowed. The patient understood, was satisfied, and agreed to proceed. May benefit from PPI post procedure. 3.  Exercise weight loss 4.  Additional recommendations and follow-up, pending, to be determined after the above.

## 2019-11-04 LAB — MITOCHONDRIAL ANTIBODIES: Mitochondrial M2 Ab, IgG: <=20 U

## 2019-11-20 ENCOUNTER — Other Ambulatory Visit: Payer: Self-pay

## 2019-11-20 MED ORDER — PROPRANOLOL HCL ER 160 MG PO CP24
160.0000 mg | ORAL_CAPSULE | Freq: Every day | ORAL | 1 refills | Status: DC
Start: 2019-11-20 — End: 2020-05-17

## 2019-12-11 ENCOUNTER — Encounter: Payer: Medicare Other | Admitting: Internal Medicine

## 2020-02-13 DIAGNOSIS — Z03818 Encounter for observation for suspected exposure to other biological agents ruled out: Secondary | ICD-10-CM | POA: Diagnosis not present

## 2020-02-16 DIAGNOSIS — F319 Bipolar disorder, unspecified: Secondary | ICD-10-CM | POA: Diagnosis not present

## 2020-03-15 DIAGNOSIS — F319 Bipolar disorder, unspecified: Secondary | ICD-10-CM | POA: Diagnosis not present

## 2020-03-27 ENCOUNTER — Other Ambulatory Visit: Payer: Self-pay | Admitting: Internal Medicine

## 2020-03-29 MED ORDER — ATORVASTATIN CALCIUM 20 MG PO TABS
20.0000 mg | ORAL_TABLET | Freq: Every day | ORAL | 1 refills | Status: DC
Start: 2020-03-29 — End: 2020-09-29

## 2020-03-29 NOTE — Addendum Note (Signed)
Addended byConrad Smithfield D on: 03/29/2020 09:01 AM   Modules accepted: Orders

## 2020-04-19 ENCOUNTER — Encounter: Payer: Self-pay | Admitting: Internal Medicine

## 2020-04-19 ENCOUNTER — Other Ambulatory Visit: Payer: Self-pay

## 2020-04-19 ENCOUNTER — Ambulatory Visit (INDEPENDENT_AMBULATORY_CARE_PROVIDER_SITE_OTHER): Payer: Medicare HMO | Admitting: Internal Medicine

## 2020-04-19 VITALS — BP 190/119 | HR 83 | Temp 98.0°F | Ht 66.0 in | Wt 187.0 lb

## 2020-04-19 DIAGNOSIS — I10 Essential (primary) hypertension: Secondary | ICD-10-CM | POA: Diagnosis not present

## 2020-04-19 DIAGNOSIS — E785 Hyperlipidemia, unspecified: Secondary | ICD-10-CM

## 2020-04-19 DIAGNOSIS — F319 Bipolar disorder, unspecified: Secondary | ICD-10-CM | POA: Diagnosis not present

## 2020-04-19 DIAGNOSIS — R7989 Other specified abnormal findings of blood chemistry: Secondary | ICD-10-CM

## 2020-04-19 DIAGNOSIS — F209 Schizophrenia, unspecified: Secondary | ICD-10-CM | POA: Diagnosis not present

## 2020-04-19 LAB — COMPREHENSIVE METABOLIC PANEL
ALT: 32 U/L (ref 0–35)
AST: 19 U/L (ref 0–37)
Albumin: 3.9 g/dL (ref 3.5–5.2)
Alkaline Phosphatase: 124 U/L — ABNORMAL HIGH (ref 39–117)
BUN: 11 mg/dL (ref 6–23)
CO2: 29 mEq/L (ref 19–32)
Calcium: 9.2 mg/dL (ref 8.4–10.5)
Chloride: 102 mEq/L (ref 96–112)
Creatinine, Ser: 0.64 mg/dL (ref 0.40–1.20)
GFR: 103.73 mL/min (ref >60.00)
Glucose, Bld: 79 mg/dL (ref 70–99)
Potassium: 4.6 mEq/L (ref 3.5–5.1)
Sodium: 136 mEq/L (ref 135–145)
Total Bilirubin: 0.3 mg/dL (ref 0.2–1.2)
Total Protein: 6.9 g/dL (ref 6.0–8.3)

## 2020-04-19 MED ORDER — AMLODIPINE BESYLATE 10 MG PO TABS: 10.0000 mg | ORAL_TABLET | Freq: Every day | ORAL | 0 refills | Status: DC

## 2020-04-19 NOTE — Progress Notes (Signed)
Subjective:    Patient ID: Chelsea [name], female    DOB: 07-Jul-1970, 50 y.o.   MRN: [id number]  DOS:  04/19/2020 Type of visit - description: Follow-up  Since the last office visit, continue with dyspepsia, saw GI, note reviewed. Bipolar: Not taking medications regularly. The patient stated she is doing okay, her mother is with her, states she has difficulty with insomnia and is not taking her medication. BP noted to be elevated, no recent ambulatory BPs.  Compliance?  Review of Systems See above   Past Medical History:  Diagnosis Date  . Allergy   . Bipolar disorder (HCC)   . Depression   . Eczema   . Elevated cholesterol   . Hypertension   . Schizophrenia [facility])     Past Surgical History:  Procedure Laterality Date  . NO PAST SURGERIES      Allergies as of 04/19/2020      Reactions   Divalproex Sodium Er Other (See Comments)   Delirium with hyperammonemia   Aspirin    REACTION: swelling   Fish Allergy       Medication List       Accurate as of April 19, 2020 11:59 PM. If you have any questions, ask your nurse or doctor.        amLODipine 10 MG tablet Commonly known as: NORVASC Take 1 tablet (10 mg total) by mouth daily. What changed:   medication strength  how much to take Changed by: Willow Ora, MD   ARIPiprazole ER 400 MG Srer injection Commonly known as: ABILIFY MAINTENA Inject 2 mLs (400 mg total) into the muscle every 28 (twenty-eight) days. Due 5/17   atorvastatin 20 MG tablet Commonly known as: LIPITOR Take 1 tablet (20 mg total) by mouth at bedtime.   benztropine 1 MG tablet Commonly known as: COGENTIN Take 1 tablet (1 mg total) by mouth daily.   carbamazepine 200 MG 12 hr tablet Commonly known as: TEGretol-XR Take 1 tablet (200 mg total) by mouth 2 (two) times daily.   cloNIDine 0.2 MG tablet Commonly known as: CATAPRES Take 1 tablet (0.2 mg total) by mouth in the morning, at noon, and at bedtime. What changed: when to take  this Changed by: Willow Ora, MD   propranolol ER 160 MG SR capsule Commonly known as: INDERAL LA Take 1 capsule (160 mg total) by mouth daily.   risperidone 4 MG tablet Commonly known as: RISPERDAL Take 1 tablet (4 mg total) by mouth 2 (two) times daily.   zolpidem 10 MG tablet Commonly known as: Ambien Take 1 tablet (10 mg total) by mouth at bedtime as needed for sleep.          Objective:   Physical Exam BP (!) 190/119 (BP Location: Left Arm, Patient Position: Sitting, Cuff Size: Large)   Pulse 83   Temp 98 F (36.7 C) (Temporal)   Ht 5\' 6"  (1.676 m)   Wt 187 lb (84.8 kg)   SpO2 97%   BMI 30.18 kg/m  General:   Well developed, NAD, BMI noted.  HEENT:  Normocephalic . Face symmetric, atraumatic Lungs:  CTA B Normal respiratory effort, no intercostal retractions, no accessory muscle use. Heart: RRR,  no murmur.  Abdomen:  Not distended, soft, non-tender. No rebound or rigidity.   Skin: Not pale. Not jaundice Lower extremities: no pretibial edema bilaterally  Neurologic:  alert & oriented X3.  Speech normal, gait appropriate for age and unassisted Psych--  Cognition and judgment appear intact.  Cooperative with normal attention span and concentration.  Behavior appropriate. No anxious or depressed appearing.     Assessment     ASSESSMENT HTN Hyperlipidemia Bipolar disorder; schizophrenia (dx as teenager); seen in [place]; h/o admissions Eczema. [facility]) Tobacco abuse  Increase alkaline phosphatase: + GGT, Abdominal US   08-2019: Gallbladder sludge, hepatic steatosis (see care everywhere);   Neg Hep C, neg  AMA Oct 2022   PLAN: HTN: BP today elevated, recheck: 190/119. Currently on clonidine 0.2 twice daily, Inderal ER 160 a day, amlodipine 5 mg daily. Compliance with meds? At the time of the visit she was very argumentative with her mother, that may have play  a role on her increased BP today. Plan: Increase clonidine to 3 times daily, increase  amlodipine to 10 mg, monitor BPs at home, check CMP. High cholesterol: On Lipitor, last LDL improved from 183-106. Increase AP: Work-up so far: + GGT, Abdominal US   08-2019: Gallbladder sludge, hepatic steatosis (see care everywhere);   Neg Hep c- AMA oct 2022.  Checking labs Dyspepsia: Saw GI, they recommended a EGD, considering PPIs.  Endoscopy not performed to my knowledge.  Encouraged to call GI. Bipolar: Poor compliance with medication, we had a long conversation about why bipolar patients do not take medications, recommend to trust her family to increase compliance, recommend to see her psychiatry provider CPX 2 months  Time spent 32 minutes, addressing chronic medical problems, extensive discussion about compliance with bipolar meds.  This visit occurred during the SARS-CoV-2 public health emergency.  Safety protocols were in place, including screening questions prior to the visit, additional usage of staff PPE, and extensive cleaning of exam room while observing appropriate contact time as indicated for disinfecting solutions.

## 2020-04-19 NOTE — Patient Instructions (Addendum)
Increase amlodipine from 5 mg to 10 mg, we sent a prescription Increase clonidine 0.2 mg from 1 tablet twice a day to 1 tablet 3 times a day  Other blood pressure medications the same  Check the  blood pressure 2 or 3 times a week BP GOAL is between 110/65 and  135/85. If it is consistently higher or lower, let me know   Please call gastroenterology at [phone] to schedule your endoscopies with Dr. Marina Goodell  GO TO THE LAB : Get the blood work     GO TO THE FRONT DESK, PLEASE SCHEDULE YOUR APPOINTMENTS Come back for physical exam in 3 months

## 2020-04-20 DIAGNOSIS — R7989 Other specified abnormal findings of blood chemistry: Secondary | ICD-10-CM | POA: Insufficient documentation

## 2020-04-20 DIAGNOSIS — F319 Bipolar disorder, unspecified: Secondary | ICD-10-CM | POA: Diagnosis not present

## 2020-04-20 NOTE — Assessment & Plan Note (Signed)
HTN: BP today elevated, recheck: 190/119. Currently on clonidine 0.2 twice daily, Inderal ER 160 a day, amlodipine 5 mg daily. Compliance with meds? At the time of the visit she was very argumentative with her mother, that may have play  a role on her increased BP today. Plan: Increase clonidine to 3 times daily, increase amlodipine to 10 mg, monitor BPs at home, check CMP. High cholesterol: On Lipitor, last LDL improved from 183-106. Increase AP: Work-up so far: + GGT, Abdominal US   08-2019: Gallbladder sludge, hepatic steatosis (see care everywhere);   Neg Hep c- AMA oct 2022.  Checking labs Dyspepsia: Saw GI, they recommended a EGD, considering PPIs.  Endoscopy not performed to my knowledge.  Encouraged to call GI. Bipolar: Poor compliance with medication, we had a long conversation about why bipolar patients do not take medications, recommend to trust her family to increase compliance, recommend to see her psychiatry provider CPX 2 months

## 2020-05-17 ENCOUNTER — Other Ambulatory Visit: Payer: Self-pay | Admitting: Internal Medicine

## 2020-05-19 ENCOUNTER — Telehealth: Payer: Self-pay

## 2020-05-19 NOTE — Telephone Encounter (Signed)
Pt called and wanted to see if she missed her apt, I reminded her that she was seen on 04/19/2020. She wanted her lab results and I advised her. I helped her reset her password for her mychart to help her get in so see can see her results . -JMA

## 2020-06-22 DIAGNOSIS — F319 Bipolar disorder, unspecified: Secondary | ICD-10-CM | POA: Diagnosis not present

## 2020-07-12 ENCOUNTER — Other Ambulatory Visit: Payer: Self-pay | Admitting: Internal Medicine

## 2020-08-12 ENCOUNTER — Other Ambulatory Visit: Payer: Self-pay | Admitting: Internal Medicine

## 2020-08-24 ENCOUNTER — Telehealth: Payer: Self-pay | Admitting: Internal Medicine

## 2020-08-24 NOTE — Telephone Encounter (Signed)
Left message for patient to call back and schedule Medicare Annual Wellness Visit (AWV) in office.   If not able to come in office, please offer to do virtually or by telephone.  Left office number and my jabber #[phone].  Due for AWVI  Please schedule at anytime with Nurse Health Advisor.   

## 2020-08-31 ENCOUNTER — Telehealth: Payer: Self-pay | Admitting: Internal Medicine

## 2020-08-31 NOTE — Telephone Encounter (Signed)
Pt called in stating that

## 2020-09-13 ENCOUNTER — Other Ambulatory Visit: Payer: Self-pay | Admitting: Internal Medicine

## 2020-09-17 ENCOUNTER — Telehealth: Payer: Self-pay | Admitting: *Deleted

## 2020-09-17 ENCOUNTER — Other Ambulatory Visit: Payer: Self-pay | Admitting: Internal Medicine

## 2020-09-17 NOTE — Telephone Encounter (Signed)
Caller Name Kyia Rhude Caller Phone Number [phone] Patient Name Chelsea [name] Patient DOB 10/207/1972 Call Type Message Only Information Provided Reason for Call Request to Schedule Office Appointment Initial Comment Caller needing an appt for annual physical Patient request to speak to RN No Disp. Time Disposition Final User 09/17/2020 7:30:44 AM General Information Provided Yes Tiburcio Pea, Lanette

## 2020-09-17 NOTE — Telephone Encounter (Signed)
Can either of you reach out to Pt to schedule cpx at her convenience please? Thank you

## 2020-09-17 NOTE — Telephone Encounter (Signed)
LVM to information pt that her appt on 09-23-2020 with Drue Novel was changed from OV to CPE visit.

## 2020-09-19 ENCOUNTER — Other Ambulatory Visit: Payer: Self-pay

## 2020-09-19 ENCOUNTER — Ambulatory Visit (HOSPITAL_COMMUNITY)
Admission: EM | Admit: 2020-09-19 | Discharge: 2020-09-19 | Disposition: A | Discharge status: Home / Self Care | Payer: Medicare HMO | Attending: Psychiatry | Admitting: Psychiatry

## 2020-09-19 DIAGNOSIS — Z046 Encounter for general psychiatric examination, requested by authority: Secondary | ICD-10-CM

## 2020-09-19 DIAGNOSIS — F319 Bipolar disorder, unspecified: Secondary | ICD-10-CM

## 2020-09-19 DIAGNOSIS — F3131 Bipolar disorder, current episode depressed, mild: Secondary | ICD-10-CM | POA: Insufficient documentation
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
## 2020-09-19 NOTE — Discharge Instructions (Addendum)

## 2020-09-22 DIAGNOSIS — F319 Bipolar disorder, unspecified: Secondary | ICD-10-CM | POA: Diagnosis not present

## 2020-09-23 ENCOUNTER — Encounter: Payer: Medicare HMO | Admitting: Internal Medicine

## 2020-09-29 ENCOUNTER — Other Ambulatory Visit: Payer: Self-pay | Admitting: Internal Medicine

## 2020-10-16 ENCOUNTER — Other Ambulatory Visit: Payer: Self-pay | Admitting: Internal Medicine

## 2020-11-02 ENCOUNTER — Encounter: Payer: Medicare HMO | Admitting: Internal Medicine

## 2020-11-02 ENCOUNTER — Encounter: Payer: Self-pay | Admitting: Internal Medicine

## 2020-11-08 ENCOUNTER — Other Ambulatory Visit: Payer: Self-pay | Admitting: Internal Medicine

## 2020-11-09 ENCOUNTER — Other Ambulatory Visit: Payer: Self-pay | Admitting: Internal Medicine

## 2020-11-11 ENCOUNTER — Encounter: Payer: Self-pay | Admitting: Internal Medicine

## 2020-11-11 ENCOUNTER — Ambulatory Visit (INDEPENDENT_AMBULATORY_CARE_PROVIDER_SITE_OTHER): Payer: Medicare HMO | Admitting: Internal Medicine

## 2020-11-11 ENCOUNTER — Other Ambulatory Visit: Payer: Self-pay

## 2020-11-11 VITALS — BP 160/96 | HR 83 | Temp 98.1°F | Ht 66.0 in | Wt 198.5 lb

## 2020-11-11 DIAGNOSIS — Z1231 Encounter for screening mammogram for malignant neoplasm of breast: Secondary | ICD-10-CM | POA: Diagnosis not present

## 2020-11-11 DIAGNOSIS — Z0000 Encounter for general adult medical examination without abnormal findings: Secondary | ICD-10-CM | POA: Diagnosis not present

## 2020-11-11 MED ORDER — AMLODIPINE BESYLATE 10 MG PO TABS: 10.0000 mg | ORAL_TABLET | Freq: Every day | ORAL | 6 refills | Status: DC

## 2020-11-11 NOTE — Progress Notes (Signed)
Subjective:    Patient ID: Chelsea [name], female    DOB: 1970-04-21, 50 y.o.   MRN: [id number]  DOS:  11/11/2020 Type of visit - description: CPX, here with her mother.  patient reports she is doing okay. Still has dyspepsia as described before. BP noted to be elevated, no recent ambulatory BPs, states he is not taking amlodipine, reason?  BP Readings from Last 3 Encounters:  11/11/20 (!) 160/96  09/19/20 (!) 162/95  04/19/20 (!) 190/119    Review of Systems  Other than above, a 14 point review of systems is negative    Past Medical History:  Diagnosis Date   Allergy    Bipolar disorder (HCC)    Depression    Eczema    Elevated cholesterol    Hypertension    Schizophrenia (HCC)     Past Surgical History:  Procedure Laterality Date   NO PAST SURGERIES     Social History   Socioeconomic History   Marital status: Divorced    Spouse name: Not on file   Number of children: Not on file   Years of education: Not on file   Highest education level: Not on file  Occupational History   Occupation: disability   Tobacco Use   Smoking status: Every Day    Packs/day: 1.00    Types: Cigarettes   Smokeless tobacco: Never   Tobacco comments:    > 1 ppd   Vaping Use   Vaping Use: Never used  Substance and Sexual Activity   Alcohol use: Not Currently    Comment:  socially , rare    Drug use: No   Sexual activity: Not Currently  Other Topics Concern   Not on file  Social History Narrative   [name]   Lives w/ parents    Father Jordyan Hardiman    Social Determinants of Health   Financial Resource Strain: Not on file  Food Insecurity: Not on file  Transportation Needs: Not on file  Physical Activity: Not on file  Stress: Not on file  Social Connections: Not on file  Intimate Partner Violence: Not on file    Allergies as of 11/11/2020       Reactions   Divalproex Sodium Er Other (See Comments)   Delirium with hyperammonemia   Aspirin    REACTION: swelling    Fish Allergy         Medication List        Accurate as of November 11, 2020 11:59 PM. If you have any questions, ask your nurse or doctor.          STOP taking these medications    ARIPiprazole ER 400 MG Srer injection Commonly known as: ABILIFY MAINTENA Stopped by: Willow Ora, MD   risperidone 4 MG tablet Commonly known as: RISPERDAL Stopped by: Willow Ora, MD   zolpidem 10 MG tablet Commonly known as: Ambien Stopped by: Willow Ora, MD       TAKE these medications    amLODipine 10 MG tablet Commonly known as: NORVASC Take 1 tablet (10 mg total) by mouth daily.   atorvastatin 20 MG tablet Commonly known as: LIPITOR TAKE 1 TABLET BY MOUTH AT BEDTIME   benztropine 1 MG tablet Commonly known as: COGENTIN Take 1 tablet (1 mg total) by mouth daily.   carbamazepine 200 MG 12 hr tablet Commonly known as: TEGretol-XR Take 1 tablet (200 mg total) by mouth 2 (two) times daily.   cloNIDine 0.2 MG tablet Commonly known as:  CATAPRES Take 1 tablet (0.2 mg total) by mouth in the morning, at noon, and at bedtime.   propranolol ER 160 MG SR capsule Commonly known as: INDERAL LA Take 1 capsule by mouth once daily           Objective:   Physical Exam BP (!) 160/96   Pulse 83   Temp 98.1 F (36.7 C)   Ht 5\' 6"  (1.676 m)   Wt 198 lb 8 oz (90 kg)   SpO2 98%   BMI 32.04 kg/m  General: Well developed, NAD, BMI noted Neck: No  thyromegaly  HEENT:  Normocephalic . Face symmetric, atraumatic Lungs:  CTA B Normal respiratory effort, no intercostal retractions, no accessory muscle use. Heart: RRR,  no murmur.  Abdomen:  Not distended, soft, non-tender. No rebound or rigidity.   Lower extremities: no pretibial edema bilaterally  Skin: Exposed areas without rash. Not pale. Not jaundice Neurologic:  alert & oriented X3.  Speech normal, gait appropriate for age and unassisted Strength symmetric and appropriate for age.  Psych: Cognition and judgment appear intact.   Cooperative with normal attention span and concentration.  Behavior appropriate. No anxious or depressed appearing.     Assessment     ASSESSMENT HTN Hyperlipidemia Bipolar disorder; schizophrenia (dx as teenager); seen in [place]; h/o admissions Eczema. [facility]) Tobacco abuse  Increase alkaline phosphatase: + GGT, Abdominal [name]   08-2019: Gallbladder sludge, hepatic steatosis (see care everywhere);   Neg Hep C, neg  AMA Oct 2022   PLAN: Here for CPX Compliance: I am not sure if she is a reliable historian, she is constantly contradicted by her mother regards to medication compliance and symptoms. HTN: On amlodipine, clonidine, Inderal.  BP today is elevated, I rechecked on the R arm: 160/95. Today she states he is not taking amlodipine. Plan: Continue present care, strongly recommend compliance, amlodipine RF, RTC 6 weeks. Bipolar disorder, seen at the emergency room 09/19/2020, did not meet criteria for inpatient treatment. Currently on Cogentin, Tegretol, Inderal, clonodinn.  States psychiatry stopped Abilify, Risperdal, Ambien Increased alkaline phosphatase: w/u neg  Dyspepsia: rec to d/w GI Tobacco: not ready to quit,pt states seen dentists regular (mother stated she has not) RTC 6 weeks     This visit occurred during the SARS-CoV-2 public health emergency.  Safety protocols were in place, including screening questions prior to the visit, additional usage of staff PPE, and extensive cleaning of exam room while observing appropriate contact time as indicated for disinfecting solutions.

## 2020-11-11 NOTE — Patient Instructions (Addendum)
Check the  blood pressure  weekly   BP GOAL is between 110/65 and  135/85. If it is consistently higher or lower, let me know  Call your gynecologist    GO TO THE LAB : Get the blood work     GO TO THE FRONT DESK, PLEASE SCHEDULE YOUR APPOINTMENTS Come back for a check up in 6 weeks    STOP BY THE FIRST FLOOR:   schedule a mammogram

## 2020-11-12 ENCOUNTER — Encounter: Payer: Self-pay | Admitting: Internal Medicine

## 2020-11-12 NOTE — Assessment & Plan Note (Signed)
-  Vaccines: Declines all again -Female care: Last Pap smear 01-2016, last mammogram 01-2019 ([facility]).  Failed to to see gyn, rec to call them [facility] referral sent again today -CCS: Never had a colonoscopy, rec an  I fob -Diet and exercise discussed -Labs: CMP, FLP,CBC, A1c, vitamin D

## 2020-11-12 NOTE — Assessment & Plan Note (Signed)
Here for CPX Compliance: I am not sure if she is a reliable historian, she is constantly contradicted by her mother regards to medication compliance and symptoms. HTN: On amlodipine, clonidine, Inderal.  BP today is elevated, I rechecked on the R arm: 160/95. Today she states he is not taking amlodipine. Plan: Continue present care, strongly recommend compliance, amlodipine RF, RTC 6 weeks. Bipolar disorder, seen at the emergency room 09/19/2020, did not meet criteria for inpatient treatment. Currently on Cogentin, Tegretol, Inderal, clonodinn.  States psychiatry stopped Abilify, Risperdal, Ambien Increased alkaline phosphatase: w/u neg  Dyspepsia: rec to d/w GI Tobacco: not ready to quit,pt states seen dentists regular (mother stated she has not) RTC 6 weeks

## 2020-11-16 ENCOUNTER — Other Ambulatory Visit (INDEPENDENT_AMBULATORY_CARE_PROVIDER_SITE_OTHER): Payer: Medicare HMO

## 2020-11-16 ENCOUNTER — Other Ambulatory Visit: Payer: Self-pay

## 2020-11-16 DIAGNOSIS — Z0000 Encounter for general adult medical examination without abnormal findings: Secondary | ICD-10-CM | POA: Diagnosis not present

## 2020-11-16 LAB — CBC WITH DIFFERENTIAL/PLATELET
Basophils Absolute: 0.1 10*3/uL (ref 0.0–0.1)
Basophils Relative: 0.7 % (ref 0.0–3.0)
Eosinophils Absolute: 0.4 10*3/uL (ref 0.0–0.7)
Eosinophils Relative: 5.7 % — ABNORMAL HIGH (ref 0.0–5.0)
HCT: 37.2 % (ref 36.0–46.0)
Hemoglobin: 12 g/dL (ref 12.0–15.0)
Lymphocytes Relative: 30.5 % (ref 12.0–46.0)
Lymphs Abs: 2.4 10*3/uL (ref 0.7–4.0)
MCHC: 32.3 g/dL (ref 30.0–36.0)
MCV: 84.6 fl (ref 78.0–100.0)
Monocytes Absolute: 0.8 10*3/uL (ref 0.1–1.0)
Monocytes Relative: 10.7 % (ref 3.0–12.0)
Neutro Abs: 4.1 10*3/uL (ref 1.4–7.7)
Neutrophils Relative %: 52.4 % (ref 43.0–77.0)
Platelets: 303 10*3/uL (ref 150.0–400.0)
RBC: 4.39 Mil/uL (ref 3.87–5.11)
RDW: 17.6 % — ABNORMAL HIGH (ref 11.5–15.5)
WBC: 7.9 10*3/uL (ref 4.0–10.5)

## 2020-11-16 LAB — LIPID PANEL
Cholesterol: 248 mg/dL — ABNORMAL HIGH (ref 0–200)
HDL: 47.9 mg/dL (ref >39.00)
NonHDL: 200.54
Total CHOL/HDL Ratio: 5
Triglycerides: 301 mg/dL — ABNORMAL HIGH (ref 0.0–149.0)
VLDL: 60.2 mg/dL — ABNORMAL HIGH (ref 0.0–40.0)

## 2020-11-16 LAB — LDL CHOLESTEROL, DIRECT: Direct LDL: 152 mg/dL

## 2020-11-16 LAB — COMPREHENSIVE METABOLIC PANEL
ALT: 26 U/L (ref 0–35)
AST: 19 U/L (ref 0–37)
Albumin: 4 g/dL (ref 3.5–5.2)
Alkaline Phosphatase: 122 U/L — ABNORMAL HIGH (ref 39–117)
BUN: 8 mg/dL (ref 6–23)
CO2: 27 mEq/L (ref 19–32)
Calcium: 8.8 mg/dL (ref 8.4–10.5)
Chloride: 102 mEq/L (ref 96–112)
Creatinine, Ser: 0.58 mg/dL (ref 0.40–1.20)
GFR: 105.79 mL/min (ref >60.00)
Glucose, Bld: 75 mg/dL (ref 70–99)
Potassium: 4.3 mEq/L (ref 3.5–5.1)
Sodium: 134 mEq/L — ABNORMAL LOW (ref 135–145)
Total Bilirubin: 0.3 mg/dL (ref 0.2–1.2)
Total Protein: 6.9 g/dL (ref 6.0–8.3)

## 2020-11-16 LAB — HEMOGLOBIN A1C: Hgb A1c MFr Bld: 6 % (ref 4.6–6.5)

## 2020-11-17 ENCOUNTER — Other Ambulatory Visit: Payer: Self-pay | Admitting: Internal Medicine

## 2020-11-18 MED ORDER — ATORVASTATIN CALCIUM 40 MG PO TABS: 40.0000 mg | ORAL_TABLET | Freq: Every day | ORAL | 1 refills | Status: DC

## 2020-11-18 NOTE — Addendum Note (Signed)
Addended byConrad [place] D on: 11/18/2020 03:56 PM   Modules accepted: Orders

## 2020-11-19 LAB — VITAMIN D 1,25 DIHYDROXY
Vitamin D 1, 25 (OH)2 Total: 38 pg/mL (ref 18–72)
Vitamin D2 1, 25 (OH)2: <8 pg/mL
Vitamin D3 1, 25 (OH)2: 38 pg/mL

## 2020-12-21 DIAGNOSIS — Z62820 Parent-biological child conflict: Secondary | ICD-10-CM | POA: Diagnosis not present

## 2020-12-21 DIAGNOSIS — F99 Mental disorder, not otherwise specified: Secondary | ICD-10-CM | POA: Diagnosis not present

## 2020-12-24 ENCOUNTER — Ambulatory Visit (INDEPENDENT_AMBULATORY_CARE_PROVIDER_SITE_OTHER): Payer: Medicare HMO | Admitting: Internal Medicine

## 2020-12-24 ENCOUNTER — Encounter: Payer: Self-pay | Admitting: Internal Medicine

## 2020-12-24 VITALS — BP 136/92 | HR 72 | Temp 98.2°F | Resp 18 | Ht 66.0 in | Wt 195.2 lb

## 2020-12-24 DIAGNOSIS — R1013 Epigastric pain: Secondary | ICD-10-CM

## 2020-12-24 DIAGNOSIS — E785 Hyperlipidemia, unspecified: Secondary | ICD-10-CM | POA: Diagnosis not present

## 2020-12-24 DIAGNOSIS — I10 Essential (primary) hypertension: Secondary | ICD-10-CM

## 2020-12-24 DIAGNOSIS — F209 Schizophrenia, unspecified: Secondary | ICD-10-CM | POA: Diagnosis not present

## 2020-12-24 MED ORDER — ATORVASTATIN CALCIUM 40 MG PO TABS: 40.0000 mg | ORAL_TABLET | Freq: Every day | ORAL | 4 refills | Status: AC

## 2020-12-24 NOTE — Progress Notes (Signed)
Subjective:    Patient ID: Chelsea [name], female    DOB: April 09, 1970, 50 y.o.   MRN: [id number]  DOS:  12/24/2020 Type of visit - description: f/u  Follow-up from previous visit. Today with talk about high cholesterol, high blood pressure. She continue with ill-defined discomfort at the abdomen postprandially. Denies nausea or vomiting. No blood in the stools  Review of Systems See above   Past Medical History:  Diagnosis Date   Allergy    Bipolar disorder (HCC)    Depression    Eczema    Elevated cholesterol    Hypertension    Schizophrenia (HCC)     Past Surgical History:  Procedure Laterality Date   NO PAST SURGERIES      Allergies as of 12/24/2020       Reactions   Divalproex Sodium Er Other (See Comments)   Delirium with hyperammonemia   Aspirin    REACTION: swelling   Fish Allergy         Medication List        Accurate as of December 24, 2020 11:59 PM. If you have any questions, ask your nurse or doctor.          amLODipine 10 MG tablet Commonly known as: NORVASC Take 1 tablet (10 mg total) by mouth daily.   atorvastatin 40 MG tablet Commonly known as: LIPITOR Take 1 tablet (40 mg total) by mouth at bedtime.   benztropine 1 MG tablet Commonly known as: COGENTIN Take 1 tablet (1 mg total) by mouth daily.   carbamazepine 200 MG 12 hr tablet Commonly known as: TEGretol-XR Take 1 tablet (200 mg total) by mouth 2 (two) times daily.   cloNIDine 0.2 MG tablet Commonly known as: CATAPRES Take 1 tablet (0.2 mg total) by mouth in the morning, at noon, and at bedtime.   propranolol ER 160 MG SR capsule Commonly known as: INDERAL LA Take 1 capsule by mouth once daily           Objective:   Physical Exam BP (!) 136/92 (BP Location: Left Arm, Patient Position: Sitting, Cuff Size: Small)    Pulse 72    Temp 98.2 F (36.8 C) (Oral)    Resp 18    Ht 5\' 6"  (1.676 m)    Wt 195 lb 4 oz (88.6 kg)    LMP 12/10/2020 (Approximate)    SpO2 96%     BMI 31.51 kg/m  General:   Well developed, NAD, BMI noted. HEENT:  Normocephalic . Face symmetric, atraumatic Lungs:  CTA B Normal respiratory effort, no intercostal retractions, no accessory muscle use. Heart: RRR,  no murmur.  Lower extremities: no pretibial edema bilaterally  Skin: Not pale. Not jaundice Neurologic:  alert & oriented X3.  Speech normal, gait appropriate for age and unassisted Psych--  Cognition and judgment appear intact.  Cooperative with normal attention span and concentration.  Behavior appropriate. No anxious or depressed appearing.      Assessment    ASSESSMENT HTN Hyperlipidemia Bipolar disorder; schizophrenia (dx as teenager); seen in [place]; h/o admissions Eczema. [facility]) Tobacco abuse  Increase alkaline phosphatase: + GGT, Abdominal [name]   08-2019: Gallbladder sludge, hepatic steatosis (see care everywhere);   Neg Hep C, neg  AMA Oct 2022   PLAN: HTN: Currently on clonidine 0.2 mg twice daily.  Inderal ER 160 mg 1 tablet daily and amlodipine 10 mg. BP today is better than last visit, 136/92. No change for now High cholesterol: Since last  visit, atorvastatin was increased to 40 mg daily but apparently patient never read Mychart message Prescription printed, encouraged to change to Lipitor 40 mg. Patient education: Strongly encouraged to use MyChart, if she does not plan to use it is better to deactivate the account. Dyspepsia: As described above, recommend to see GI. RTC 3 months fasting     This visit occurred during the SARS-CoV-2 public health emergency.  Safety protocols were in place, including screening questions prior to the visit, additional usage of staff PPE, and extensive cleaning of exam room while observing appropriate contact time as indicated for disinfecting solutions.

## 2020-12-24 NOTE — Patient Instructions (Addendum)
Check the  blood pressure regularly (once a week) BP GOAL is between 110/65 and  135/85. If it is consistently higher or lower, let me know  Lipitor (atorvastatin) is going to be 40 mg a day, we sent the prescription to [facility].  Call the gastroenterology office and set up a follow-up.  Their number is [phone]  GO TO THE FRONT DESK, PLEASE SCHEDULE YOUR APPOINTMENTS Come back for a checkup in 3 months

## 2020-12-25 NOTE — Assessment & Plan Note (Signed)
HTN: Currently on clonidine 0.2 mg twice daily.  Inderal ER 160 mg 1 tablet daily and amlodipine 10 mg. BP today is better than last visit, 136/92. No change for now High cholesterol: Since last visit, atorvastatin was increased to 40 mg daily but apparently patient never read Mychart message Prescription printed, encouraged to change to Lipitor 40 mg. Patient education: Strongly encouraged to use MyChart, if she does not plan to use it is better to deactivate the account. Dyspepsia: As described above, recommend to see GI. RTC 3 months fasting

## 2020-12-27 ENCOUNTER — Other Ambulatory Visit: Payer: Self-pay

## 2020-12-27 ENCOUNTER — Ambulatory Visit (HOSPITAL_BASED_OUTPATIENT_CLINIC_OR_DEPARTMENT_OTHER)
Admission: RE | Admit: 2020-12-27 | Discharge: 2020-12-27 | Disposition: A | Discharge status: Home / Self Care | Payer: Medicare HMO | Source: Ambulatory Visit | Attending: Internal Medicine | Admitting: Internal Medicine

## 2020-12-27 ENCOUNTER — Encounter (HOSPITAL_BASED_OUTPATIENT_CLINIC_OR_DEPARTMENT_OTHER): Payer: Self-pay

## 2020-12-27 DIAGNOSIS — Z1231 Encounter for screening mammogram for malignant neoplasm of breast: Secondary | ICD-10-CM | POA: Diagnosis not present

## 2020-12-29 ENCOUNTER — Other Ambulatory Visit: Payer: Self-pay | Admitting: Internal Medicine

## 2020-12-29 DIAGNOSIS — R928 Other abnormal and inconclusive findings on diagnostic imaging of breast: Secondary | ICD-10-CM

## 2021-01-12 ENCOUNTER — Other Ambulatory Visit: Payer: Self-pay | Admitting: Internal Medicine

## 2021-01-25 DIAGNOSIS — F312 Bipolar disorder, current episode manic severe with psychotic features: Secondary | ICD-10-CM | POA: Diagnosis not present

## 2021-02-01 DIAGNOSIS — Z62820 Parent-biological child conflict: Secondary | ICD-10-CM | POA: Diagnosis not present

## 2021-02-01 DIAGNOSIS — F99 Mental disorder, not otherwise specified: Secondary | ICD-10-CM | POA: Diagnosis not present

## 2021-02-15 ENCOUNTER — Other Ambulatory Visit: Payer: Medicare HMO

## 2021-02-15 DIAGNOSIS — F312 Bipolar disorder, current episode manic severe with psychotic features: Secondary | ICD-10-CM | POA: Diagnosis not present

## 2021-02-28 DIAGNOSIS — F312 Bipolar disorder, current episode manic severe with psychotic features: Secondary | ICD-10-CM | POA: Diagnosis not present

## 2021-03-04 ENCOUNTER — Ambulatory Visit (INDEPENDENT_AMBULATORY_CARE_PROVIDER_SITE_OTHER): Payer: Medicare PPO | Admitting: Family Medicine

## 2021-03-04 ENCOUNTER — Encounter: Payer: Self-pay | Admitting: Family Medicine

## 2021-03-04 VITALS — BP 134/82 | HR 94 | Temp 97.9°F | Resp 16 | Ht 66.0 in | Wt 202.8 lb

## 2021-03-04 DIAGNOSIS — L243 Irritant contact dermatitis due to cosmetics: Secondary | ICD-10-CM

## 2021-03-04 MED ORDER — PREDNISONE 20 MG PO TABS: 40.0000 mg | ORAL_TABLET | Freq: Every day | ORAL | 0 refills | Status: AC

## 2021-03-04 MED ORDER — TRIAMCINOLONE ACETONIDE 0.025 % EX OINT: 1.0000 "application " | TOPICAL_OINTMENT | Freq: Two times a day (BID) | CUTANEOUS | 0 refills | Status: DC

## 2021-03-04 NOTE — Patient Instructions (Signed)
Try not to scratch as this can make things worse. Avoid scented products while dealing with this. You may resume when the itchiness resolves. Cold/cool compresses can help.   Ice/cold pack over area for 10-15 min twice daily.  Consider a non-scented facial lotion or something like Cetaphil or Cerave.   Start Zyrtec 10 mg daily to help with itching. You can use Benadryl for breakthrough itching.  Use the steroid cream no more than twice daily.   Let us know if you need anything.

## 2021-03-04 NOTE — Progress Notes (Signed)
Chief Complaint  Patient presents with   Rash    Here for skin rash on face onset for 1 week     Chelsea [name] is a 51 y.o. female here for a skin complaint.  Duration: 1 week Location: face Pruritic? Yes Painful? Yes Drainage? No New soaps/lotions/topicals/detergents? Yes; used a new lotion a couple weeks ago Sick contacts? No Other associated symptoms: spreading, no fevers, other lesions on body, sob, swelling.  Therapies tried thus far: Benadryl, Old rx cream.   Past Medical History:  Diagnosis Date   Allergy    Bipolar disorder (HCC)    Depression    Eczema    Elevated cholesterol    Hypertension    Schizophrenia (HCC)     BP 134/82 (BP Location: Left Arm, Cuff Size: Normal)    Pulse 94    Temp 97.9 F (36.6 C) (Oral)    Resp 16    Ht 5\' 6"  (1.676 m)    Wt 202 lb 12.8 oz (92 kg)    SpO2 98%    BMI 32.73 kg/m  Gen: awake, alert, appearing stated age Lungs: No accessory muscle use Skin: patches of pink with some excoriation over forehead, cheeks and jaw b/l. No drainage, TTP, fluctuance, vesiculation  Psych: Age appropriate judgment and insight  Irritant contact dermatitis due to cosmetics - Plan: predniSONE (DELTASONE) 20 MG tablet, triamcinolone (KENALOG) 0.025 % ointment  5 d pred burst. Effectiveness may be decreased due to BPD meds, but won't affect tx for her chronic diseases. Zyrtec qd, Benadryl prn, try not to scratch. Don't use scented products. Use non-scented facial emollient.  F/u prn. The patient voiced understanding and agreement to the plan.  [place], DO 03/04/21 11:13 AM

## 2021-03-07 ENCOUNTER — Telehealth: Payer: Self-pay | Admitting: Internal Medicine

## 2021-03-07 NOTE — Telephone Encounter (Signed)
Patient had an abnormal mammogram 12/27/2020, the breast center has not been able to locate the patient and they contacted me. I called the patient and left a message > Encouraged her to proceed with further evaluation and gave the number of the breast center .  Also, I spoke with the patient's mother who is in the [place] ,  above information was shared with her.  Encouraged  to help her daughter proceed with further evaluation of abnormal mammogram

## 2021-03-15 DIAGNOSIS — F312 Bipolar disorder, current episode manic severe with psychotic features: Secondary | ICD-10-CM | POA: Diagnosis not present

## 2021-03-18 ENCOUNTER — Encounter: Payer: Self-pay | Admitting: Internal Medicine

## 2021-03-18 ENCOUNTER — Ambulatory Visit (INDEPENDENT_AMBULATORY_CARE_PROVIDER_SITE_OTHER): Payer: Medicare HMO | Admitting: Internal Medicine

## 2021-03-18 VITALS — BP 142/82 | HR 105 | Temp 98.1°F | Resp 16 | Ht 66.0 in | Wt 198.1 lb

## 2021-03-18 DIAGNOSIS — L243 Irritant contact dermatitis due to cosmetics: Secondary | ICD-10-CM | POA: Diagnosis not present

## 2021-03-18 MED ORDER — PREDNISONE 10 MG PO TABS: ORAL_TABLET | ORAL | 0 refills | Status: DC

## 2021-03-18 MED ORDER — FEXOFENADINE HCL 60 MG PO TABS: 60.0000 mg | ORAL_TABLET | Freq: Two times a day (BID) | ORAL | Status: AC | PRN

## 2021-03-18 NOTE — Patient Instructions (Signed)
Get over-the-counter Allegra 60 mg 1 tablet twice daily until better ? ?Second round of prednisone starting today ? ?Use the triamcinolone cream at the face, neck and elbows. ? ?Call if not gradually better ? ?Continue avoiding the lotion. ? ? ?

## 2021-03-18 NOTE — Progress Notes (Signed)
? ?Subjective:  ? ? Patient ID: Chelsea [name], female    DOB: 03-30-1970, 51 y.o.   MRN: [id number] ? ?DOS:  03/18/2021 ?Type of visit - description: Acute ? ?Seen at this office by Dr. Carmelia Roller, 03/04/2021, she developed a facial rash about 2 weeks after he started a new make-up remover cream. ?At the time the patient said the rash was localized only at the face. ?Rx prednisone and triamcinolone topically. ?She took prednisone regularly but tried  triamcinolone only 1 or 2 days. ? ?She is here because the facial rash is not better. ?When I examined her, I noted also rash on the neck, elbows, abdomen. ?She said that she developed that after the last visit. ?She has not used the make-up anymore and does not remember placing it anywhere about the face. ? ?Denies fever chills ?No cough or quizzing ?No nausea vomiting ?No URI type of symptoms ?No lip or tongue swelling ? ?Review of Systems ?See above  ? ?Past Medical History:  ?Diagnosis Date  ? Allergy   ? Bipolar disorder (HCC)   ? Depression   ? Eczema   ? Elevated cholesterol   ? Hypertension   ? Schizophrenia (HCC)   ? ? ?Past Surgical History:  ?Procedure Laterality Date  ? NO PAST SURGERIES    ? ? ?Current Outpatient Medications  ?Medication Instructions  ? amLODipine (NORVASC) 10 mg, Oral, Daily  ? atorvastatin (LIPITOR) 40 mg, Oral, Daily at bedtime  ? benztropine (COGENTIN) 1 mg, Oral, Daily  ? carbamazepine (TEGRETOL-XR) 200 mg, Oral, 2 times daily  ? cloNIDine (CATAPRES) 0.2 mg, Oral, 3 times daily  ? propranolol ER (INDERAL LA) 160 MG SR capsule Take 1 capsule by mouth once daily  ? triamcinolone (KENALOG) 0.025 % ointment 1 application., Topical, 2 times daily  ? ? ?   ?Objective:  ? Physical Exam ?BP (!) 142/82 (BP Location: Left Arm, Patient Position: Sitting, Cuff Size: Small)   Pulse (!) 105   Temp 98.1 ?F (36.7 ?C) (Oral)   Resp 16   Ht 5\' 6"  (1.676 m)   Wt 198 lb 2 oz (89.9 kg)   SpO2 96%   BMI 31.98 kg/m?  ?General:   ?Well developed, NAD, BMI  noted. ?HEENT:  ?Normocephalic . Face symmetric, atraumatic ?Lungs: Clear to auscultation bilaterally ?Cardiovascular: Regular rate and rhythm ?Lower extremities: no pretibial edema bilaterally  ?Skin:  ?Significant rash on the face, neck, upper chest, abdomen.  See pictures. ?Minimal rash at the back, no Christmas tree distribution ?Legs with no rash. ?Neurologic:  ?alert & oriented X3.  ?Speech normal, gait appropriate for age and unassisted ?Psych--  ?Cognition and judgment appear intact.  ?Cooperative with normal attention span and concentration.  ?Behavior appropriate. ?No anxious or depressed appearing.  ? ? ? ? ?   ?Assessment   ? ?ASSESSMENT ?HTN ?Hyperlipidemia ?Bipolar disorder; schizophrenia (dx as teenager); seen in [place]; h/o admissions ?Eczema. [name]) ?Tobacco abuse  ?Increase alkaline phosphatase: + GGT, Abdominal [facility]   08-2019: Gallbladder sludge, hepatic steatosis (see care everywhere);   Neg Hep C, neg  AMA Oct 2022 ? ? ?PLAN: ?Allergic reaction: ?Rash as described above, pruritic, no systemic symptoms, initially only on the face now also at the neck, chest and abdomen. ?Low suspicion for viral exanthema or pityriasis rosea given significant face symptoms. ?Plan: ?Second round of prednisone, Allegra (no interaction with Tegretol), to use triamcinolone lotion. ?Call if not gradually better ?Call if severe symptoms ?Continue avoidance of new make-up products. ? ?  This visit occurred during the SARS-CoV-2 public health emergency.  Safety protocols were in place, including screening questions prior to the visit, additional usage of staff PPE, and extensive cleaning of exam room while observing appropriate contact time as indicated for disinfecting solutions.  ? ?

## 2021-03-19 NOTE — Assessment & Plan Note (Signed)
Allergic reaction: ?Rash as described above, pruritic, no systemic symptoms, initially only on the face now also at the neck, chest and abdomen. ?Low suspicion for viral exanthema or pityriasis rosea given significant face symptoms. ?Plan: ?Second round of prednisone, Allegra (no interaction with Tegretol), to use triamcinolone lotion. ?Call if not gradually better ?Call if severe symptoms ?Continue avoidance of new make-up products. ?

## 2021-03-24 ENCOUNTER — Ambulatory Visit: Payer: Medicare HMO | Admitting: Internal Medicine

## 2021-03-25 ENCOUNTER — Ambulatory Visit
Admission: RE | Admit: 2021-03-25 | Discharge: 2021-03-25 | Disposition: A | Discharge status: Home / Self Care | Payer: Medicare HMO | Source: Ambulatory Visit | Attending: Internal Medicine | Admitting: Internal Medicine

## 2021-03-25 ENCOUNTER — Ambulatory Visit
Admission: RE | Admit: 2021-03-25 | Discharge: 2021-03-25 | Disposition: A | Discharge status: Home / Self Care | Payer: Medicare PPO | Source: Ambulatory Visit | Attending: Internal Medicine | Admitting: Internal Medicine

## 2021-03-25 ENCOUNTER — Other Ambulatory Visit: Payer: Self-pay | Admitting: Internal Medicine

## 2021-03-25 DIAGNOSIS — R928 Other abnormal and inconclusive findings on diagnostic imaging of breast: Secondary | ICD-10-CM

## 2021-03-25 DIAGNOSIS — R922 Inconclusive mammogram: Secondary | ICD-10-CM | POA: Diagnosis not present

## 2021-03-28 DIAGNOSIS — F312 Bipolar disorder, current episode manic severe with psychotic features: Secondary | ICD-10-CM | POA: Diagnosis not present

## 2021-04-13 ENCOUNTER — Other Ambulatory Visit: Payer: Medicare HMO

## 2021-04-15 ENCOUNTER — Other Ambulatory Visit: Payer: Self-pay | Admitting: Internal Medicine

## 2021-04-18 ENCOUNTER — Other Ambulatory Visit: Payer: Self-pay

## 2021-04-18 MED ORDER — PROPRANOLOL HCL ER 160 MG PO CP24: 160.0000 mg | ORAL_CAPSULE | Freq: Every day | ORAL | 0 refills | Status: DC

## 2021-04-20 DIAGNOSIS — F312 Bipolar disorder, current episode manic severe with psychotic features: Secondary | ICD-10-CM | POA: Diagnosis not present

## 2021-04-26 DIAGNOSIS — F312 Bipolar disorder, current episode manic severe with psychotic features: Secondary | ICD-10-CM | POA: Diagnosis not present

## 2021-05-04 DIAGNOSIS — F312 Bipolar disorder, current episode manic severe with psychotic features: Secondary | ICD-10-CM | POA: Diagnosis not present

## 2021-05-16 DIAGNOSIS — F312 Bipolar disorder, current episode manic severe with psychotic features: Secondary | ICD-10-CM | POA: Diagnosis not present

## 2021-05-24 DIAGNOSIS — F312 Bipolar disorder, current episode manic severe with psychotic features: Secondary | ICD-10-CM | POA: Diagnosis not present

## 2021-06-15 ENCOUNTER — Encounter: Payer: Self-pay | Admitting: Internal Medicine

## 2021-06-15 ENCOUNTER — Ambulatory Visit: Payer: Medicare HMO | Admitting: Internal Medicine

## 2021-06-22 DIAGNOSIS — F312 Bipolar disorder, current episode manic severe with psychotic features: Secondary | ICD-10-CM | POA: Diagnosis not present

## 2021-06-28 DIAGNOSIS — F312 Bipolar disorder, current episode manic severe with psychotic features: Secondary | ICD-10-CM | POA: Diagnosis not present

## 2021-06-29 ENCOUNTER — Encounter: Payer: Self-pay | Admitting: Internal Medicine

## 2021-06-29 ENCOUNTER — Ambulatory Visit: Payer: Medicare HMO | Admitting: Internal Medicine

## 2021-06-29 ENCOUNTER — Ambulatory Visit (INDEPENDENT_AMBULATORY_CARE_PROVIDER_SITE_OTHER): Payer: Medicare PPO | Admitting: Internal Medicine

## 2021-06-29 VITALS — BP 132/80 | HR 77 | Temp 98.0°F | Resp 18 | Ht 66.0 in | Wt 198.4 lb

## 2021-06-29 DIAGNOSIS — F17200 Nicotine dependence, unspecified, uncomplicated: Secondary | ICD-10-CM | POA: Diagnosis not present

## 2021-06-29 DIAGNOSIS — E785 Hyperlipidemia, unspecified: Secondary | ICD-10-CM | POA: Diagnosis not present

## 2021-06-29 DIAGNOSIS — I10 Essential (primary) hypertension: Secondary | ICD-10-CM | POA: Diagnosis not present

## 2021-06-29 DIAGNOSIS — R052 Subacute cough: Secondary | ICD-10-CM | POA: Diagnosis not present

## 2021-06-29 NOTE — Progress Notes (Signed)
   Subjective:    Patient ID: Chelsea [name], female    DOB: 1970/01/28, 51 y.o.   MRN: [id number]  DOS:  06/29/2021 Type of visit - description: F/U, here with his mother  Today with talk about hypertension, high cholesterol, tobacco abuse.  She still smokes at least 1 pack a day (her mother said she smokes 2 pack a day).  For the last 2 months has noted cough, daily, mostly in the morning, occasionally has mild sputum production without hemoptysis. Denies fever chills.  No weight loss No GERD symptoms No wheezing.   BP Readings from Last 3 Encounters:  06/29/21 132/80  03/18/21 (!) 142/82  03/04/21 134/82     Review of Systems See above   Past Medical History:  Diagnosis Date   Allergy    Bipolar disorder (HCC)    Depression    Eczema    Elevated cholesterol    Hypertension    Schizophrenia (HCC)     Past Surgical History:  Procedure Laterality Date   NO PAST SURGERIES      Current Outpatient Medications  Medication Instructions   amLODipine (NORVASC) 10 mg, Oral, Daily   atorvastatin (LIPITOR) 40 mg, Oral, Daily at bedtime   benztropine (COGENTIN) 1 mg, Oral, Daily   carbamazepine (TEGRETOL-XR) 200 mg, Oral, 2 times daily   cloNIDine (CATAPRES) 0.2 mg, Oral, 3 times daily   fexofenadine (ALLEGRA ALLERGY) 60 mg, Oral, 2 times daily PRN   predniSONE (DELTASONE) 10 MG tablet 4 tablets x 2 days, 3 tabs x 2 days, 2 tabs x 2 days, 1 tab x 2 days   propranolol ER (INDERAL LA) 160 mg, Oral, Daily   triamcinolone (KENALOG) 0.025 % ointment 1 application , Topical, 2 times daily       Objective:   Physical Exam BP 132/80   Pulse 77   Temp 98 F (36.7 C) (Oral)   Resp 18   Ht 5\' 6"  (1.676 m)   Wt 198 lb 6 oz (90 kg)   SpO2 94%   BMI 32.02 kg/m  General:   Well developed, NAD, BMI noted. HEENT:  Normocephalic . Face symmetric, atraumatic Lungs:  CTA B Normal respiratory effort, no intercostal retractions, no accessory muscle use. Heart: RRR,  no  murmur.  Lower extremities: no pretibial edema bilaterally  Skin: Not pale. Not jaundice Neurologic:  alert & oriented X3.  Speech normal, gait appropriate for age and unassisted Psych--  Cognition and judgment appear intact.  Cooperative with normal attention span and concentration.  Behavior appropriate. No anxious or depressed appearing.      Assessment     ASSESSMENT HTN Hyperlipidemia Bipolar disorder; schizophrenia (dx as teenager); seen in [place]; h/o admissions Eczema. [facility]) Tobacco abuse  Increase alkaline phosphatase: + GGT, Abdominal [name]   08-2019: Gallbladder sludge, hepatic steatosis (see care everywhere);   Neg Hep C, neg  AMA Oct 2022   PLAN: HTN: No recent ambulatory BPs, on amlodipine, Inderal.  Was on clonidine as Rx by psychiatry, it was stopped. BP satisfactory.  Recommend to monitor at home.  No change Hyperlipidemia: Reports good compliance with Lipitor, check FLP, AST, ALT Tobacco abuse, cough: Still smokes 1  or 2 pack a day, cough for 2 months, see HPI. I encouraged her to think about  quit tobacco, she is not ready D/t cough, will get a CXR although pt thinks she has a "smokers cough"  Further advised with results. RTC November 2023 CPX

## 2021-06-29 NOTE — Patient Instructions (Addendum)
Check the  blood pressure regularly BP GOAL is between 110/65 and  135/85. If it is consistently higher or lower, let me know    GO TO THE LAB : Get the blood work     GO TO THE FRONT DESK, PLEASE SCHEDULE YOUR APPOINTMENTS Come back for a physical exam by November 2023   STOP BY THE FIRST FLOOR:  get the XR

## 2021-06-30 ENCOUNTER — Telehealth: Payer: Self-pay | Admitting: Internal Medicine

## 2021-06-30 NOTE — Telephone Encounter (Signed)
LVM in spanish pt is needing to schedule a lab appt - and to inform that she also has an order for xrays.

## 2021-06-30 NOTE — Telephone Encounter (Signed)
LVM in spanish for pt to call office and schedule lab appt and to inform that also has orders for xrays.

## 2021-06-30 NOTE — Telephone Encounter (Signed)
-----   Message from [place], [place] sent at 06/29/2021  4:56 PM EDT ----- Regarding: Lab and x-ray Can you contact Pt's mom Darien Ramus (she only speaks spanish)- and let her know that Pt did not do labs or x-ray today? Can you schedule Pt for labs please?    Thank you, [facility]

## 2021-06-30 NOTE — Telephone Encounter (Signed)
-----   Message from Kaylyn Canter, CMA sent at 06/29/2021  4:56 PM EDT ----- Regarding: Lab and x-ray Can you contact Pt's mom Ana (she only speaks spanish)- and let her know that Pt did not do labs or x-ray today? Can you schedule Pt for labs please?    Thank you, [facility]  

## 2021-07-07 ENCOUNTER — Other Ambulatory Visit: Payer: Medicare PPO

## 2021-07-11 ENCOUNTER — Other Ambulatory Visit: Payer: Self-pay | Admitting: Internal Medicine

## 2021-07-14 ENCOUNTER — Ambulatory Visit (HOSPITAL_BASED_OUTPATIENT_CLINIC_OR_DEPARTMENT_OTHER)
Admission: RE | Admit: 2021-07-14 | Discharge: 2021-07-14 | Disposition: A | Discharge status: Home / Self Care | Payer: Medicare PPO | Source: Ambulatory Visit | Attending: Internal Medicine | Admitting: Internal Medicine

## 2021-07-14 ENCOUNTER — Other Ambulatory Visit (INDEPENDENT_AMBULATORY_CARE_PROVIDER_SITE_OTHER): Payer: Medicare PPO

## 2021-07-14 DIAGNOSIS — F17200 Nicotine dependence, unspecified, uncomplicated: Secondary | ICD-10-CM | POA: Diagnosis not present

## 2021-07-14 DIAGNOSIS — E785 Hyperlipidemia, unspecified: Secondary | ICD-10-CM | POA: Diagnosis not present

## 2021-07-14 DIAGNOSIS — R052 Subacute cough: Secondary | ICD-10-CM | POA: Diagnosis not present

## 2021-07-14 DIAGNOSIS — R059 Cough, unspecified: Secondary | ICD-10-CM | POA: Diagnosis not present

## 2021-07-14 LAB — LIPID PANEL
Cholesterol: 159 mg/dL (ref 0–200)
HDL: 38.7 mg/dL — ABNORMAL LOW (ref >39.00)
NonHDL: 120.75
Total CHOL/HDL Ratio: 4
Triglycerides: 228 mg/dL — ABNORMAL HIGH (ref 0.0–149.0)
VLDL: 45.6 mg/dL — ABNORMAL HIGH (ref 0.0–40.0)

## 2021-07-14 LAB — AST: AST: 15 U/L (ref 0–37)

## 2021-07-14 LAB — LDL CHOLESTEROL, DIRECT: Direct LDL: 90 mg/dL

## 2021-07-14 LAB — ALT: ALT: 19 U/L (ref 0–35)

## 2021-07-14 MED ORDER — AZITHROMYCIN 250 MG PO TABS: ORAL_TABLET | ORAL | 0 refills | Status: AC

## 2021-07-14 NOTE — Addendum Note (Signed)
Addended by: Conrad  D on: 07/14/2021 11:59 AM   Modules accepted: Orders

## 2021-07-19 DIAGNOSIS — F312 Bipolar disorder, current episode manic severe with psychotic features: Secondary | ICD-10-CM | POA: Diagnosis not present

## 2021-07-20 ENCOUNTER — Other Ambulatory Visit: Payer: Self-pay | Admitting: Internal Medicine

## 2021-07-22 DIAGNOSIS — L409 Psoriasis, unspecified: Secondary | ICD-10-CM | POA: Diagnosis not present

## 2021-07-22 DIAGNOSIS — L309 Dermatitis, unspecified: Secondary | ICD-10-CM | POA: Diagnosis not present

## 2021-07-22 DIAGNOSIS — F17210 Nicotine dependence, cigarettes, uncomplicated: Secondary | ICD-10-CM | POA: Diagnosis not present

## 2021-07-22 DIAGNOSIS — F319 Bipolar disorder, unspecified: Secondary | ICD-10-CM | POA: Diagnosis not present

## 2021-07-22 DIAGNOSIS — I10 Essential (primary) hypertension: Secondary | ICD-10-CM | POA: Diagnosis not present

## 2021-07-22 DIAGNOSIS — Z888 Allergy status to other drugs, medicaments and biological substances status: Secondary | ICD-10-CM | POA: Diagnosis not present

## 2021-07-22 DIAGNOSIS — F209 Schizophrenia, unspecified: Secondary | ICD-10-CM | POA: Diagnosis not present

## 2021-07-22 DIAGNOSIS — Z79899 Other long term (current) drug therapy: Secondary | ICD-10-CM | POA: Diagnosis not present

## 2021-07-22 DIAGNOSIS — Z886 Allergy status to analgesic agent status: Secondary | ICD-10-CM | POA: Diagnosis not present

## 2021-07-26 ENCOUNTER — Ambulatory Visit: Payer: Medicare PPO | Admitting: Internal Medicine

## 2021-07-26 ENCOUNTER — Telehealth: Payer: Self-pay

## 2021-07-26 NOTE — Telephone Encounter (Signed)
Multiple recent no shows:   11/02/20 06/15/21 07/26/21  Would you like to begin dismissal process?

## 2021-07-27 ENCOUNTER — Encounter: Payer: Self-pay | Admitting: Internal Medicine

## 2021-07-27 NOTE — Telephone Encounter (Signed)
Not at this point

## 2021-08-01 ENCOUNTER — Other Ambulatory Visit: Payer: Self-pay | Admitting: Internal Medicine

## 2021-08-09 DIAGNOSIS — F312 Bipolar disorder, current episode manic severe with psychotic features: Secondary | ICD-10-CM | POA: Diagnosis not present

## 2021-08-10 ENCOUNTER — Other Ambulatory Visit: Payer: Self-pay

## 2021-08-10 DIAGNOSIS — L243 Irritant contact dermatitis due to cosmetics: Secondary | ICD-10-CM

## 2021-08-10 MED ORDER — TRIAMCINOLONE ACETONIDE 0.025 % EX OINT: 1.0000 | TOPICAL_OINTMENT | Freq: Two times a day (BID) | CUTANEOUS | 0 refills | Status: AC

## 2021-08-19 ENCOUNTER — Ambulatory Visit: Payer: Medicare HMO | Admitting: Internal Medicine

## 2021-08-22 ENCOUNTER — Telehealth: Payer: Self-pay

## 2021-08-22 ENCOUNTER — Encounter: Payer: Self-pay | Admitting: Internal Medicine

## 2021-08-22 NOTE — Telephone Encounter (Signed)
4 No shows-   11/02/20 06/15/21 07/26/21 08/22/21  I know on 07/26/21 you did not want to begin dismissal process. Please advise if you would like to proceed.

## 2021-08-22 NOTE — Telephone Encounter (Signed)
Please start process of dismissal. Thank you.

## 2021-08-22 NOTE — Telephone Encounter (Signed)
Start the process.

## 2021-08-23 ENCOUNTER — Telehealth: Payer: Self-pay | Admitting: Internal Medicine

## 2021-08-23 NOTE — Telephone Encounter (Signed)
Patient called to confirm that she received a letter for missed appts but that she was here on that day. Reviewed dates of service that patient missed and advised that due to multiple missed appointments, we can no longer see her. Advised we can see her for urgent needs over the next 30 days. Patient said okay.

## 2021-09-08 DIAGNOSIS — F312 Bipolar disorder, current episode manic severe with psychotic features: Secondary | ICD-10-CM | POA: Diagnosis not present

## 2021-10-07 ENCOUNTER — Other Ambulatory Visit: Payer: Self-pay | Admitting: Internal Medicine

## 2021-10-11 ENCOUNTER — Other Ambulatory Visit: Payer: Self-pay | Admitting: Internal Medicine

## 2021-10-16 ENCOUNTER — Other Ambulatory Visit: Payer: Self-pay | Admitting: Internal Medicine

## 2021-11-08 DIAGNOSIS — F312 Bipolar disorder, current episode manic severe with psychotic features: Secondary | ICD-10-CM | POA: Diagnosis not present

## 2021-11-29 ENCOUNTER — Other Ambulatory Visit: Payer: Self-pay | Admitting: Internal Medicine

## 2021-11-29 DIAGNOSIS — F312 Bipolar disorder, current episode manic severe with psychotic features: Secondary | ICD-10-CM | POA: Diagnosis not present

## 2021-12-08 ENCOUNTER — Encounter: Payer: Medicare PPO | Admitting: Internal Medicine

## 2021-12-20 DIAGNOSIS — F312 Bipolar disorder, current episode manic severe with psychotic features: Secondary | ICD-10-CM | POA: Diagnosis not present

## 2022-03-30 DIAGNOSIS — I10 Essential (primary) hypertension: Secondary | ICD-10-CM | POA: Diagnosis not present

## 2022-03-30 DIAGNOSIS — E669 Obesity, unspecified: Secondary | ICD-10-CM | POA: Diagnosis not present

## 2022-03-30 DIAGNOSIS — Z0000 Encounter for general adult medical examination without abnormal findings: Secondary | ICD-10-CM | POA: Diagnosis not present

## 2022-03-30 DIAGNOSIS — Z13220 Encounter for screening for lipoid disorders: Secondary | ICD-10-CM | POA: Diagnosis not present

## 2022-03-30 DIAGNOSIS — F339 Major depressive disorder, recurrent, unspecified: Secondary | ICD-10-CM | POA: Diagnosis not present

## 2022-03-30 DIAGNOSIS — Z130 Encounter for screening for diseases of the blood and blood-forming organs and certain disorders involving the immune mechanism: Secondary | ICD-10-CM | POA: Diagnosis not present

## 2022-03-30 DIAGNOSIS — F17200 Nicotine dependence, unspecified, uncomplicated: Secondary | ICD-10-CM | POA: Diagnosis not present

## 2022-10-04 DIAGNOSIS — F17210 Nicotine dependence, cigarettes, uncomplicated: Secondary | ICD-10-CM | POA: Diagnosis not present

## 2022-10-04 DIAGNOSIS — Z79899 Other long term (current) drug therapy: Secondary | ICD-10-CM | POA: Diagnosis not present

## 2022-10-04 DIAGNOSIS — I10 Essential (primary) hypertension: Secondary | ICD-10-CM | POA: Diagnosis not present

## 2022-10-04 DIAGNOSIS — Z886 Allergy status to analgesic agent status: Secondary | ICD-10-CM | POA: Diagnosis not present

## 2022-10-04 DIAGNOSIS — L232 Allergic contact dermatitis due to cosmetics: Secondary | ICD-10-CM | POA: Diagnosis not present

## 2022-10-11 DIAGNOSIS — F17200 Nicotine dependence, unspecified, uncomplicated: Secondary | ICD-10-CM | POA: Diagnosis not present

## 2022-10-11 DIAGNOSIS — L509 Urticaria, unspecified: Secondary | ICD-10-CM | POA: Diagnosis not present

## 2022-10-11 DIAGNOSIS — I10 Essential (primary) hypertension: Secondary | ICD-10-CM | POA: Diagnosis not present

## 2022-10-11 DIAGNOSIS — L259 Unspecified contact dermatitis, unspecified cause: Secondary | ICD-10-CM | POA: Diagnosis not present

## 2022-10-17 DIAGNOSIS — F17200 Nicotine dependence, unspecified, uncomplicated: Secondary | ICD-10-CM | POA: Diagnosis not present

## 2022-10-17 DIAGNOSIS — I10 Essential (primary) hypertension: Secondary | ICD-10-CM | POA: Diagnosis not present

## 2022-10-17 DIAGNOSIS — E669 Obesity, unspecified: Secondary | ICD-10-CM | POA: Diagnosis not present

## 2022-10-17 DIAGNOSIS — F319 Bipolar disorder, unspecified: Secondary | ICD-10-CM | POA: Diagnosis not present

## 2022-10-17 DIAGNOSIS — Z6833 Body mass index (BMI) 33.0-33.9, adult: Secondary | ICD-10-CM | POA: Diagnosis not present

## 2022-10-17 DIAGNOSIS — E782 Mixed hyperlipidemia: Secondary | ICD-10-CM | POA: Diagnosis not present

## 2022-10-17 DIAGNOSIS — F209 Schizophrenia, unspecified: Secondary | ICD-10-CM | POA: Diagnosis not present

## 2022-10-17 DIAGNOSIS — K760 Fatty (change of) liver, not elsewhere classified: Secondary | ICD-10-CM | POA: Diagnosis not present

## 2022-11-27 DIAGNOSIS — E669 Obesity, unspecified: Secondary | ICD-10-CM | POA: Diagnosis not present

## 2022-11-27 DIAGNOSIS — I10 Essential (primary) hypertension: Secondary | ICD-10-CM | POA: Diagnosis not present

## 2022-11-27 DIAGNOSIS — F319 Bipolar disorder, unspecified: Secondary | ICD-10-CM | POA: Diagnosis not present

## 2022-11-27 DIAGNOSIS — F17200 Nicotine dependence, unspecified, uncomplicated: Secondary | ICD-10-CM | POA: Diagnosis not present

## 2022-11-27 DIAGNOSIS — L282 Other prurigo: Secondary | ICD-10-CM | POA: Diagnosis not present

## 2022-11-27 DIAGNOSIS — F209 Schizophrenia, unspecified: Secondary | ICD-10-CM | POA: Diagnosis not present

## 2022-12-04 DIAGNOSIS — E669 Obesity, unspecified: Secondary | ICD-10-CM | POA: Diagnosis not present

## 2022-12-04 DIAGNOSIS — F17210 Nicotine dependence, cigarettes, uncomplicated: Secondary | ICD-10-CM | POA: Diagnosis not present

## 2022-12-04 DIAGNOSIS — Z008 Encounter for other general examination: Secondary | ICD-10-CM | POA: Diagnosis not present

## 2022-12-04 DIAGNOSIS — Z6830 Body mass index (BMI) 30.0-30.9, adult: Secondary | ICD-10-CM | POA: Diagnosis not present

## 2022-12-04 DIAGNOSIS — I10 Essential (primary) hypertension: Secondary | ICD-10-CM | POA: Diagnosis not present

## 2022-12-04 DIAGNOSIS — E785 Hyperlipidemia, unspecified: Secondary | ICD-10-CM | POA: Diagnosis not present

## 2022-12-04 DIAGNOSIS — F319 Bipolar disorder, unspecified: Secondary | ICD-10-CM | POA: Diagnosis not present

## 2023-08-23 DIAGNOSIS — F3181 Bipolar II disorder: Secondary | ICD-10-CM | POA: Diagnosis not present

## 2023-12-17 IMAGING — MG MM DIGITAL DIAGNOSTIC UNILAT*R* W/ TOMO W/ CAD
6 series · 6 of 18 positions shown · non-contrast
Comparison: Previous exam(s).

CLINICAL DATA: 50-year-old female presenting as a recall from
screening for possible right breast mass.

EXAM:
DIGITAL DIAGNOSTIC UNILATERAL RIGHT MAMMOGRAM WITH TOMOSYNTHESIS AND
CAD; ULTRASOUND RIGHT BREAST LIMITED
TECHNIQUE: Right digital diagnostic mammography and breast tomosynthesis was
performed. The images were evaluated with computer-aided detection.;
Targeted ultrasound examination of the right breast was performed

[R MLO synth-2D (1 of 2)]
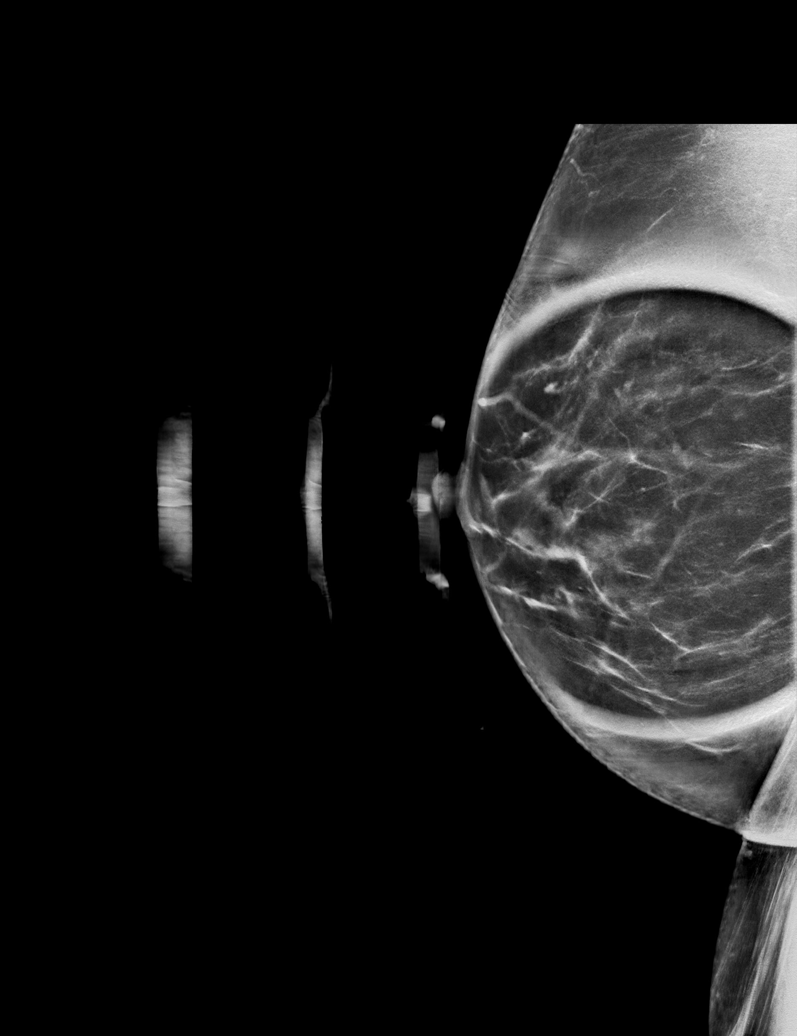

[R CC synth-2D]
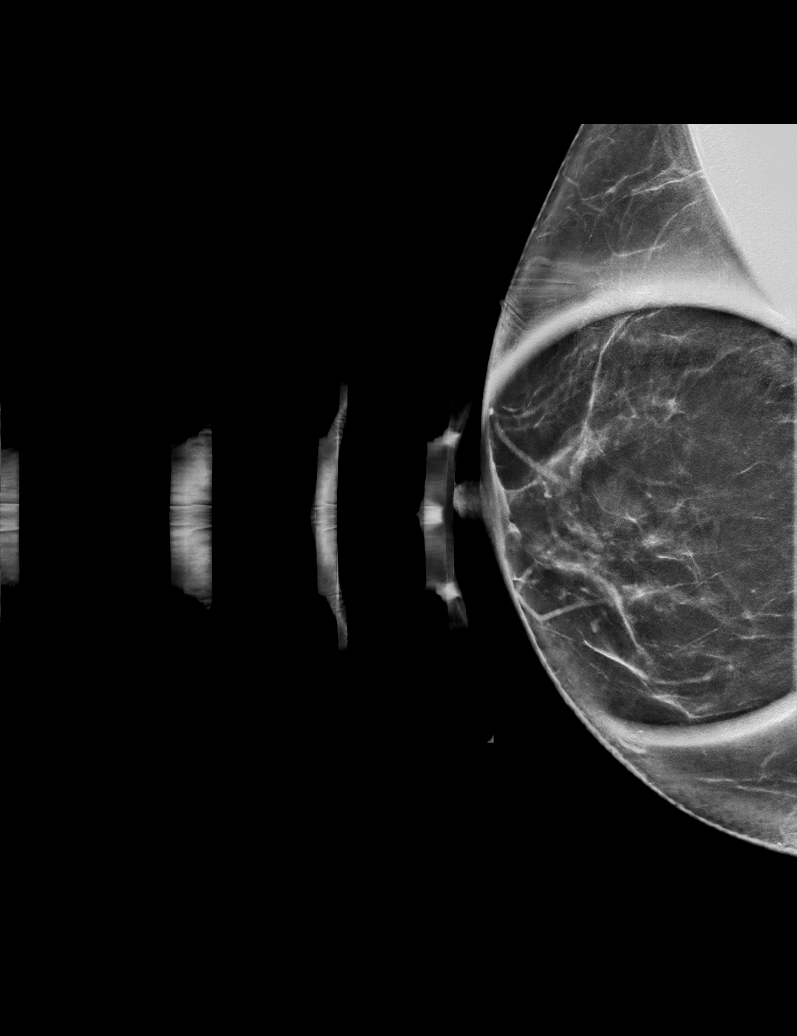

[R MLO synth-2D (2 of 2)]
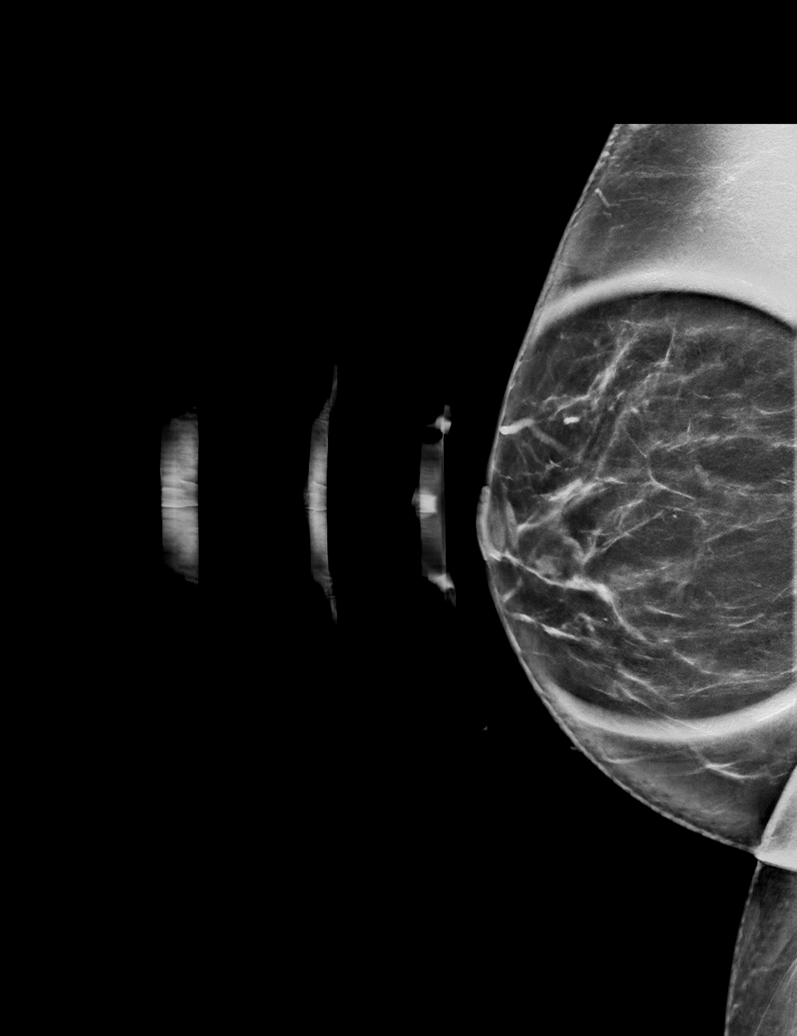

[R MLO tomo (1 of 2) · tomo slice 47/93.0]
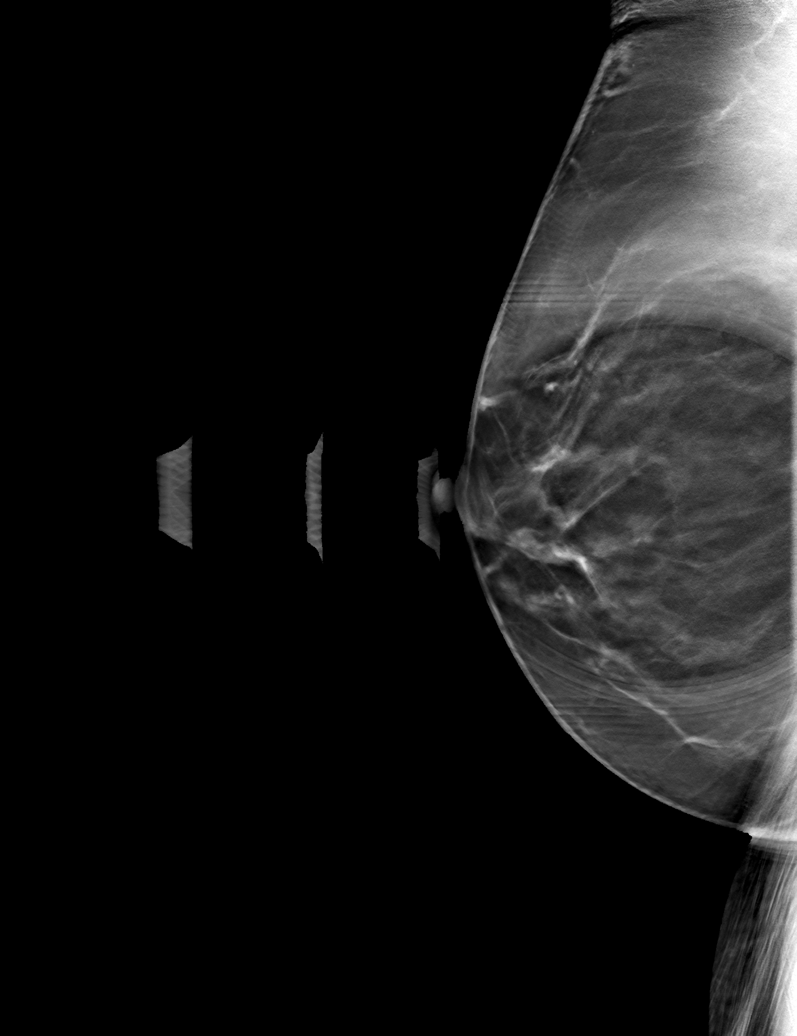

[R CC tomo · tomo slice 40/79.0]
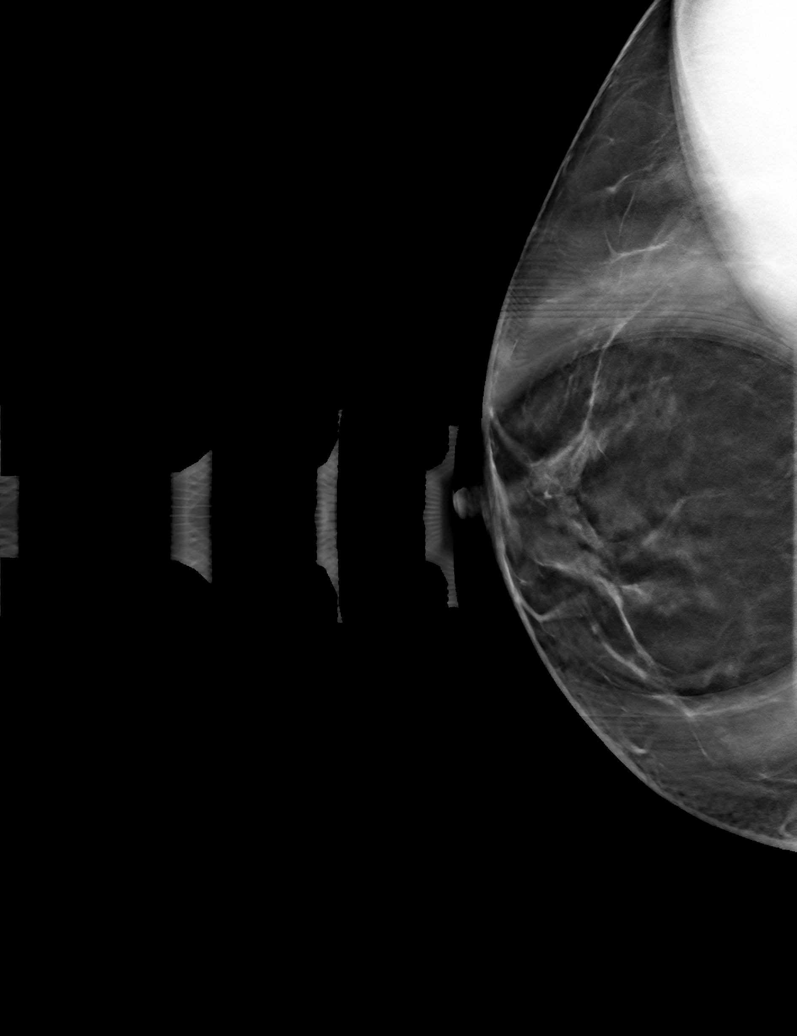

[R MLO tomo (2 of 2) · tomo slice 45/90.0]
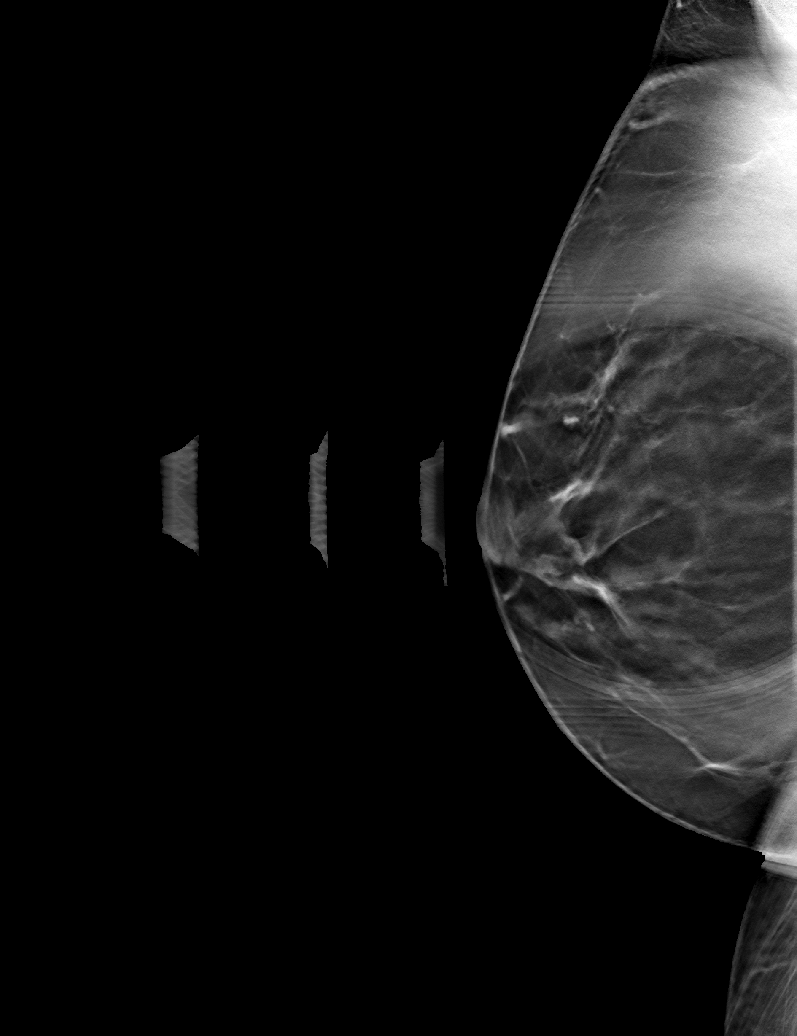

[6 of 18 positions shown; findings below may reference images not displayed]

ACR Breast Density Category b: There are scattered areas of
fibroglandular density.
FINDINGS: Mammogram:

Right breast: Spot compression tomosynthesis views of the right
breast were performed demonstrating persistence of an irregular mass
measuring approximately 1.0 cm in the inferior retroareolar right
breast.

Ultrasound:

Targeted ultrasound performed in the right breast at 6 o'clock
retroareolar demonstrating an irregular hypoechoic mass measuring
1.2 x 0.5 x 1.1 cm. This corresponds to the mammographic finding.
Targeted ultrasound the right axilla demonstrates normal lymph
nodes.
IMPRESSION: Indeterminate mass in the right breast at 6 o'clock retroareolar
measuring 1.2 cm.

RECOMMENDATION:
Ultrasound-guided core needle biopsy of the right breast mass.

I have discussed the findings and recommendations with the patient
who agrees to proceed with biopsy. The patient will be scheduled for
the biopsy appointment prior to leaving the office today.

BI-RADS CATEGORY  4: Suspicious.

## 2023-12-17 IMAGING — US US BREAST*R* LIMITED INC AXILLA
1 series · 13 of 14 positions shown · non-contrast
Comparison: Previous exam(s).

CLINICAL DATA: 50-year-old female presenting as a recall from
screening for possible right breast mass.

EXAM:
DIGITAL DIAGNOSTIC UNILATERAL RIGHT MAMMOGRAM WITH TOMOSYNTHESIS AND
CAD; ULTRASOUND RIGHT BREAST LIMITED
TECHNIQUE: Right digital diagnostic mammography and breast tomosynthesis was
performed. The images were evaluated with computer-aided detection.;
Targeted ultrasound examination of the right breast was performed

[Series 1: us breast*right* limited inc axilla · 0.06mm/px · 13 of 14 slices shown]
[im 1/14]
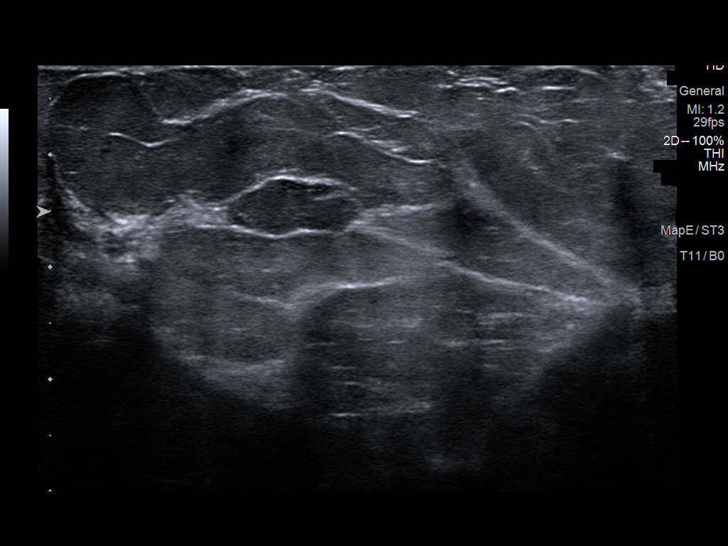
[im 2/14]
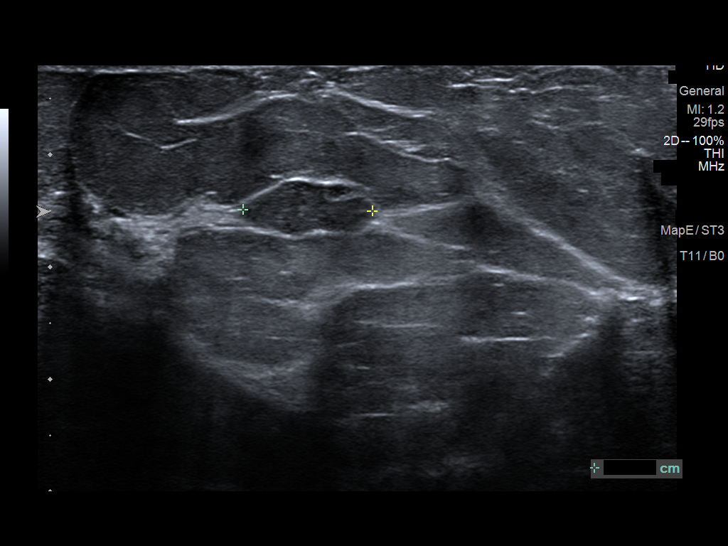
[im 3/14]
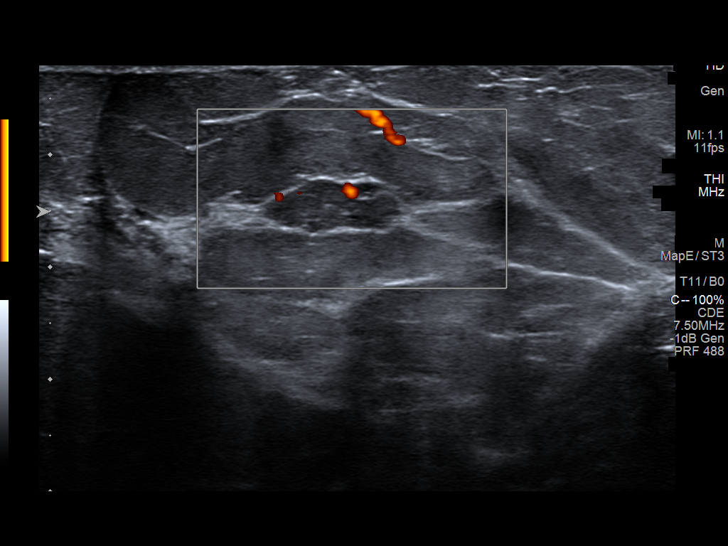
[im 4/14]
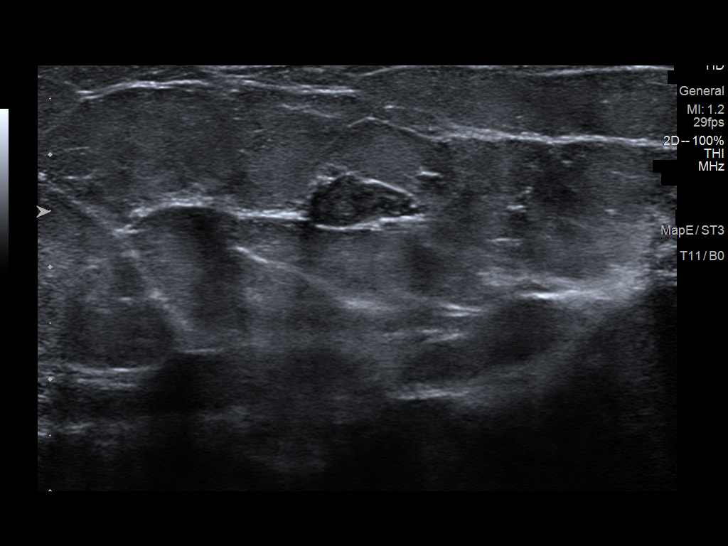
[im 5/14]
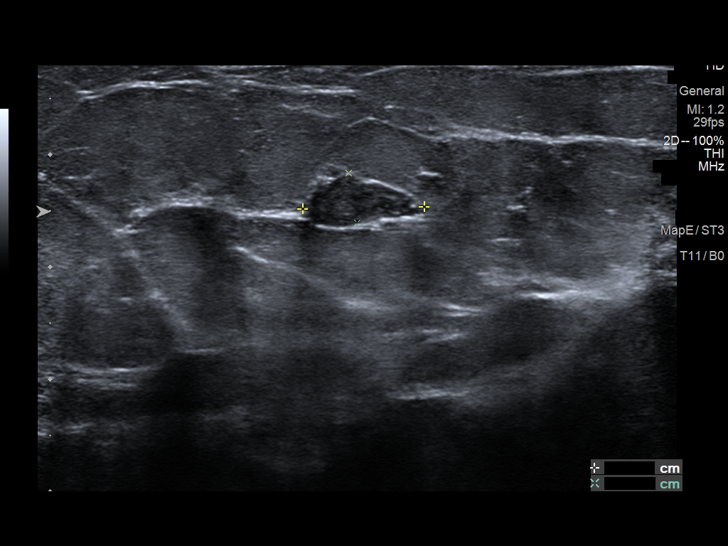
[im 6/14]
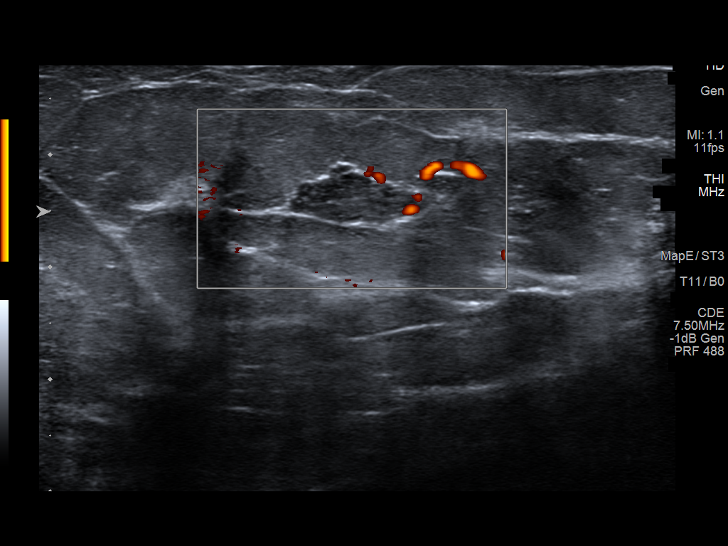
[im 8/14]
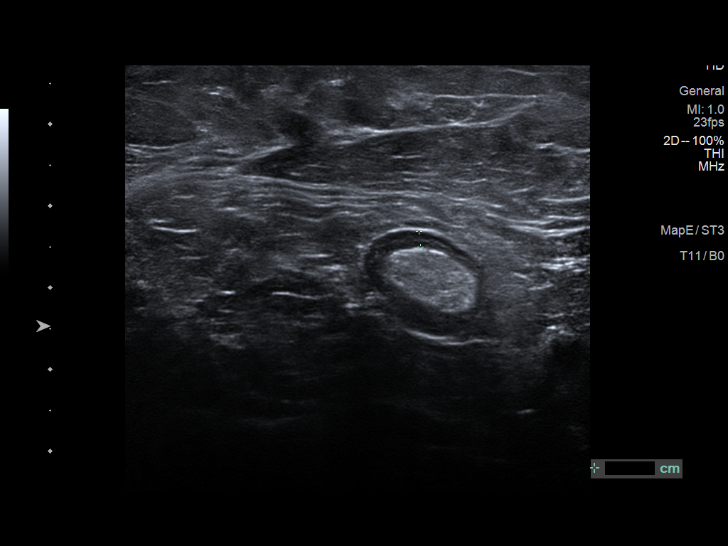
[im 9/14]
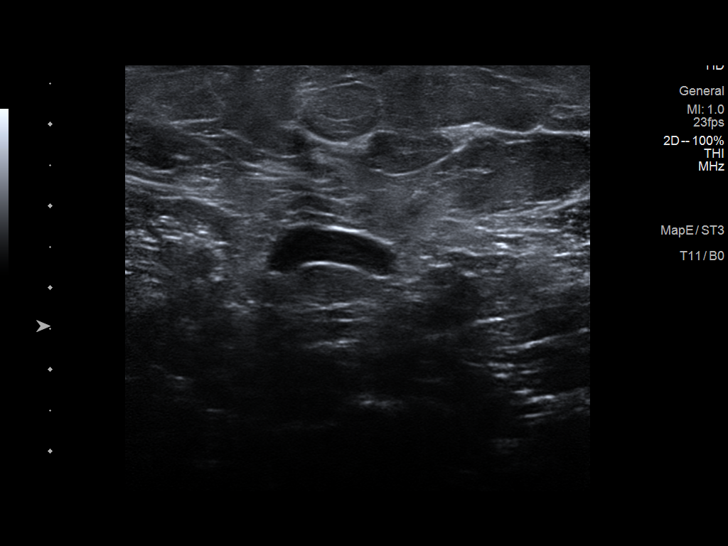
[im 10/14]
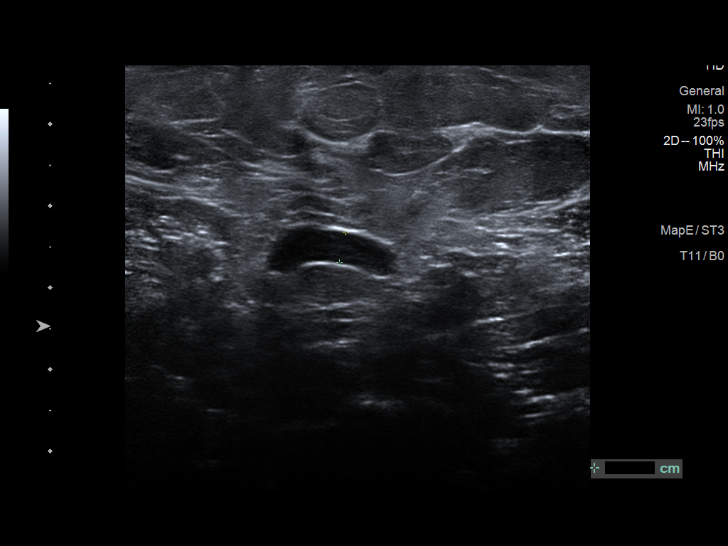
[im 11/14]
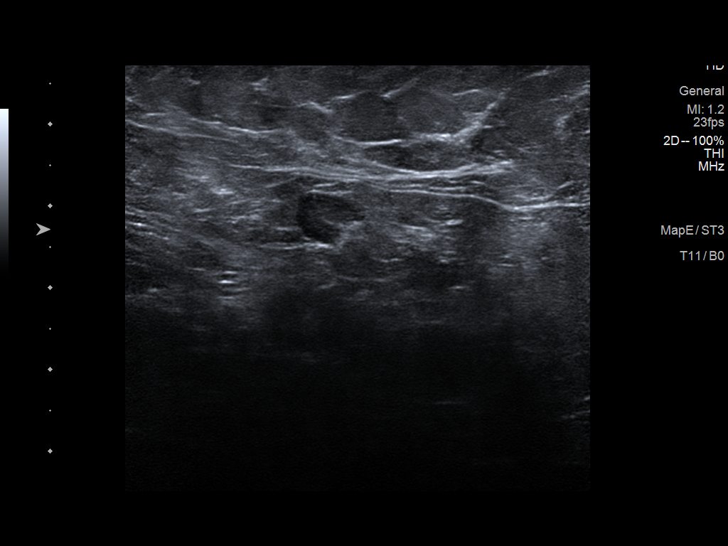
[im 12/14]
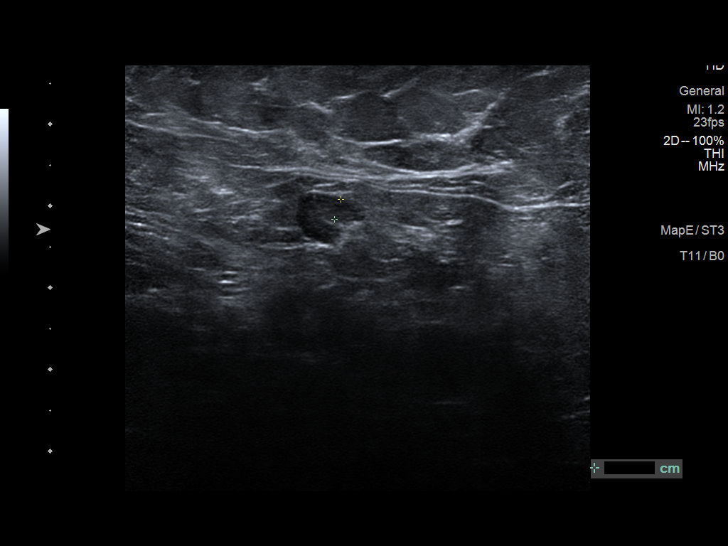
[im 13/14]
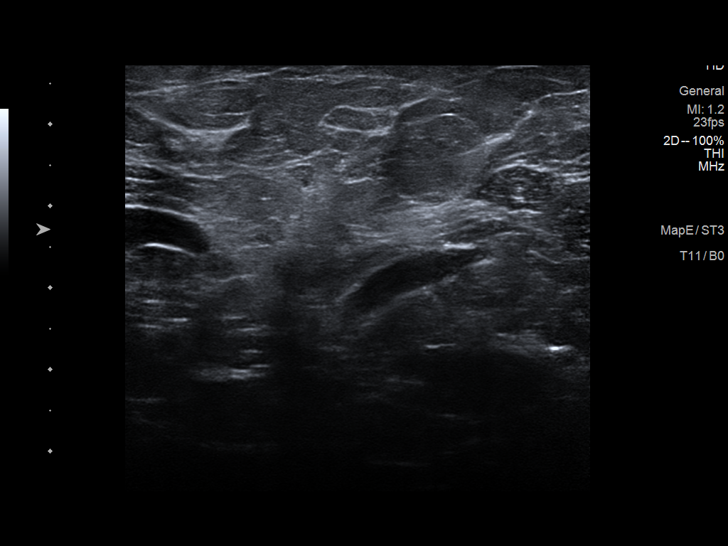
[im 14/14]
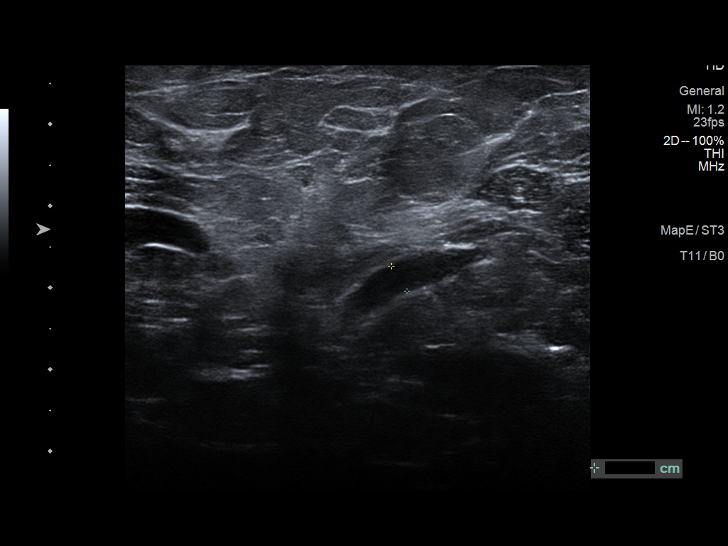

[13 of 14 positions shown; findings below may reference images not displayed]

ACR Breast Density Category b: There are scattered areas of
fibroglandular density.
FINDINGS: Mammogram:

Right breast: Spot compression tomosynthesis views of the right
breast were performed demonstrating persistence of an irregular mass
measuring approximately 1.0 cm in the inferior retroareolar right
breast.

Ultrasound:

Targeted ultrasound performed in the right breast at 6 o'clock
retroareolar demonstrating an irregular hypoechoic mass measuring
1.2 x 0.5 x 1.1 cm. This corresponds to the mammographic finding.
Targeted ultrasound the right axilla demonstrates normal lymph
nodes.
IMPRESSION: Indeterminate mass in the right breast at 6 o'clock retroareolar
measuring 1.2 cm.

RECOMMENDATION:
Ultrasound-guided core needle biopsy of the right breast mass.

I have discussed the findings and recommendations with the patient
who agrees to proceed with biopsy. The patient will be scheduled for
the biopsy appointment prior to leaving the office today.

BI-RADS CATEGORY  4: Suspicious.

## 2024-02-06 ENCOUNTER — Other Ambulatory Visit: Payer: Self-pay | Admitting: Family Medicine

## 2024-02-06 DIAGNOSIS — K760 Fatty (change of) liver, not elsewhere classified: Secondary | ICD-10-CM

## 2024-02-22 ENCOUNTER — Other Ambulatory Visit
